# Patient Record
Sex: Male | Born: 1945 | Race: Black or African American | Hispanic: No | Marital: Married | Smoking: Former smoker
Health system: Southern US, Community
[De-identification: ages and names within clinical notes are randomized; demographics above are authoritative.]

## PROBLEM LIST (undated history)

## (undated) DIAGNOSIS — J45909 Unspecified asthma, uncomplicated: Secondary | ICD-10-CM

## (undated) DIAGNOSIS — I10 Essential (primary) hypertension: Secondary | ICD-10-CM

## (undated) DIAGNOSIS — F32A Depression, unspecified: Secondary | ICD-10-CM

## (undated) DIAGNOSIS — B1920 Unspecified viral hepatitis C without hepatic coma: Secondary | ICD-10-CM

## (undated) DIAGNOSIS — R011 Cardiac murmur, unspecified: Secondary | ICD-10-CM

## (undated) HISTORY — DX: Depression, unspecified: F32.A

## (undated) HISTORY — DX: Essential (primary) hypertension: I10

## (undated) HISTORY — PX: CARPAL TUNNEL RELEASE: SHX101

---

## 2014-11-20 ENCOUNTER — Other Ambulatory Visit (HOSPITAL_COMMUNITY): Payer: Self-pay | Admitting: Gastroenterology

## 2014-11-20 DIAGNOSIS — B182 Chronic viral hepatitis C: Secondary | ICD-10-CM

## 2014-12-01 ENCOUNTER — Other Ambulatory Visit (HOSPITAL_COMMUNITY): Payer: 59

## 2014-12-18 ENCOUNTER — Other Ambulatory Visit (HOSPITAL_COMMUNITY): Payer: Self-pay | Admitting: Gastroenterology

## 2014-12-18 ENCOUNTER — Ambulatory Visit (HOSPITAL_COMMUNITY)
Admission: RE | Admit: 2014-12-18 | Discharge: 2014-12-18 | Disposition: A | Discharge status: Home / Self Care | Payer: 59 | Source: Ambulatory Visit | Attending: Gastroenterology | Admitting: Gastroenterology

## 2014-12-18 DIAGNOSIS — B182 Chronic viral hepatitis C: Secondary | ICD-10-CM | POA: Insufficient documentation

## 2014-12-18 DIAGNOSIS — K769 Liver disease, unspecified: Secondary | ICD-10-CM | POA: Insufficient documentation

## 2014-12-18 DIAGNOSIS — K7689 Other specified diseases of liver: Secondary | ICD-10-CM | POA: Insufficient documentation

## 2015-06-18 DIAGNOSIS — B182 Chronic viral hepatitis C: Secondary | ICD-10-CM | POA: Diagnosis not present

## 2015-07-02 DIAGNOSIS — J302 Other seasonal allergic rhinitis: Secondary | ICD-10-CM | POA: Diagnosis not present

## 2015-07-02 DIAGNOSIS — Z6830 Body mass index (BMI) 30.0-30.9, adult: Secondary | ICD-10-CM | POA: Diagnosis not present

## 2015-07-02 DIAGNOSIS — K7469 Other cirrhosis of liver: Secondary | ICD-10-CM | POA: Diagnosis not present

## 2015-07-02 DIAGNOSIS — B182 Chronic viral hepatitis C: Secondary | ICD-10-CM | POA: Diagnosis not present

## 2015-07-02 DIAGNOSIS — Z1389 Encounter for screening for other disorder: Secondary | ICD-10-CM | POA: Diagnosis not present

## 2015-07-02 DIAGNOSIS — I10 Essential (primary) hypertension: Secondary | ICD-10-CM | POA: Diagnosis not present

## 2015-07-02 DIAGNOSIS — Z0000 Encounter for general adult medical examination without abnormal findings: Secondary | ICD-10-CM | POA: Diagnosis not present

## 2015-07-02 DIAGNOSIS — K7689 Other specified diseases of liver: Secondary | ICD-10-CM | POA: Diagnosis not present

## 2015-07-13 DIAGNOSIS — H6690 Otitis media, unspecified, unspecified ear: Secondary | ICD-10-CM | POA: Diagnosis not present

## 2015-07-22 DIAGNOSIS — R932 Abnormal findings on diagnostic imaging of liver and biliary tract: Secondary | ICD-10-CM | POA: Diagnosis not present

## 2015-07-22 DIAGNOSIS — K769 Liver disease, unspecified: Secondary | ICD-10-CM | POA: Diagnosis not present

## 2015-07-22 DIAGNOSIS — Q859 Phakomatosis, unspecified: Secondary | ICD-10-CM | POA: Diagnosis not present

## 2015-07-22 DIAGNOSIS — B182 Chronic viral hepatitis C: Secondary | ICD-10-CM | POA: Diagnosis not present

## 2015-07-22 DIAGNOSIS — K7689 Other specified diseases of liver: Secondary | ICD-10-CM | POA: Diagnosis not present

## 2015-07-22 DIAGNOSIS — K7469 Other cirrhosis of liver: Secondary | ICD-10-CM | POA: Diagnosis not present

## 2015-09-10 DIAGNOSIS — B182 Chronic viral hepatitis C: Secondary | ICD-10-CM | POA: Diagnosis not present

## 2015-09-17 DIAGNOSIS — K7469 Other cirrhosis of liver: Secondary | ICD-10-CM | POA: Diagnosis not present

## 2015-09-17 DIAGNOSIS — Z6830 Body mass index (BMI) 30.0-30.9, adult: Secondary | ICD-10-CM | POA: Diagnosis not present

## 2015-09-17 DIAGNOSIS — B182 Chronic viral hepatitis C: Secondary | ICD-10-CM | POA: Diagnosis not present

## 2015-09-17 DIAGNOSIS — K7689 Other specified diseases of liver: Secondary | ICD-10-CM | POA: Diagnosis not present

## 2015-10-30 DIAGNOSIS — K635 Polyp of colon: Secondary | ICD-10-CM | POA: Diagnosis not present

## 2015-10-30 DIAGNOSIS — I10 Essential (primary) hypertension: Secondary | ICD-10-CM | POA: Diagnosis not present

## 2015-10-30 DIAGNOSIS — Z6831 Body mass index (BMI) 31.0-31.9, adult: Secondary | ICD-10-CM | POA: Diagnosis not present

## 2015-10-30 DIAGNOSIS — B1920 Unspecified viral hepatitis C without hepatic coma: Secondary | ICD-10-CM | POA: Diagnosis not present

## 2015-11-18 DIAGNOSIS — N3020 Other chronic cystitis without hematuria: Secondary | ICD-10-CM | POA: Diagnosis not present

## 2015-11-18 DIAGNOSIS — N3281 Overactive bladder: Secondary | ICD-10-CM | POA: Diagnosis not present

## 2015-11-18 DIAGNOSIS — E291 Testicular hypofunction: Secondary | ICD-10-CM | POA: Diagnosis not present

## 2015-11-18 DIAGNOSIS — N401 Enlarged prostate with lower urinary tract symptoms: Secondary | ICD-10-CM | POA: Diagnosis not present

## 2016-02-29 DIAGNOSIS — I10 Essential (primary) hypertension: Secondary | ICD-10-CM | POA: Diagnosis not present

## 2016-02-29 DIAGNOSIS — F331 Major depressive disorder, recurrent, moderate: Secondary | ICD-10-CM | POA: Diagnosis not present

## 2016-05-25 DIAGNOSIS — Z79899 Other long term (current) drug therapy: Secondary | ICD-10-CM | POA: Diagnosis not present

## 2016-05-25 DIAGNOSIS — F330 Major depressive disorder, recurrent, mild: Secondary | ICD-10-CM | POA: Diagnosis not present

## 2016-05-25 DIAGNOSIS — I10 Essential (primary) hypertension: Secondary | ICD-10-CM | POA: Diagnosis not present

## 2016-05-25 DIAGNOSIS — B182 Chronic viral hepatitis C: Secondary | ICD-10-CM | POA: Diagnosis not present

## 2016-06-27 DIAGNOSIS — B182 Chronic viral hepatitis C: Secondary | ICD-10-CM | POA: Diagnosis not present

## 2016-06-27 DIAGNOSIS — Z6830 Body mass index (BMI) 30.0-30.9, adult: Secondary | ICD-10-CM | POA: Diagnosis not present

## 2016-06-27 DIAGNOSIS — Z79899 Other long term (current) drug therapy: Secondary | ICD-10-CM | POA: Diagnosis not present

## 2016-06-27 DIAGNOSIS — K7460 Unspecified cirrhosis of liver: Secondary | ICD-10-CM | POA: Diagnosis not present

## 2016-07-01 DIAGNOSIS — K7460 Unspecified cirrhosis of liver: Secondary | ICD-10-CM | POA: Diagnosis not present

## 2016-07-01 DIAGNOSIS — C220 Liver cell carcinoma: Secondary | ICD-10-CM | POA: Diagnosis not present

## 2016-07-01 DIAGNOSIS — B182 Chronic viral hepatitis C: Secondary | ICD-10-CM | POA: Diagnosis not present

## 2016-07-20 DIAGNOSIS — B182 Chronic viral hepatitis C: Secondary | ICD-10-CM | POA: Diagnosis not present

## 2016-07-20 DIAGNOSIS — Z79899 Other long term (current) drug therapy: Secondary | ICD-10-CM | POA: Diagnosis not present

## 2016-08-29 DIAGNOSIS — I10 Essential (primary) hypertension: Secondary | ICD-10-CM | POA: Diagnosis not present

## 2016-08-29 DIAGNOSIS — F331 Major depressive disorder, recurrent, moderate: Secondary | ICD-10-CM | POA: Diagnosis not present

## 2016-10-12 DIAGNOSIS — B182 Chronic viral hepatitis C: Secondary | ICD-10-CM | POA: Diagnosis not present

## 2016-10-12 DIAGNOSIS — Z79899 Other long term (current) drug therapy: Secondary | ICD-10-CM | POA: Diagnosis not present

## 2016-10-31 DIAGNOSIS — F330 Major depressive disorder, recurrent, mild: Secondary | ICD-10-CM | POA: Diagnosis not present

## 2016-11-16 DIAGNOSIS — E291 Testicular hypofunction: Secondary | ICD-10-CM | POA: Diagnosis not present

## 2016-11-16 DIAGNOSIS — N401 Enlarged prostate with lower urinary tract symptoms: Secondary | ICD-10-CM | POA: Diagnosis not present

## 2016-11-16 DIAGNOSIS — N3020 Other chronic cystitis without hematuria: Secondary | ICD-10-CM | POA: Diagnosis not present

## 2016-12-13 DIAGNOSIS — Z6831 Body mass index (BMI) 31.0-31.9, adult: Secondary | ICD-10-CM | POA: Diagnosis not present

## 2016-12-13 DIAGNOSIS — Z23 Encounter for immunization: Secondary | ICD-10-CM | POA: Diagnosis not present

## 2016-12-13 DIAGNOSIS — K7460 Unspecified cirrhosis of liver: Secondary | ICD-10-CM | POA: Diagnosis not present

## 2016-12-28 DIAGNOSIS — K7460 Unspecified cirrhosis of liver: Secondary | ICD-10-CM | POA: Diagnosis not present

## 2016-12-28 DIAGNOSIS — R932 Abnormal findings on diagnostic imaging of liver and biliary tract: Secondary | ICD-10-CM | POA: Diagnosis not present

## 2017-01-31 DIAGNOSIS — Z0000 Encounter for general adult medical examination without abnormal findings: Secondary | ICD-10-CM | POA: Diagnosis not present

## 2017-01-31 DIAGNOSIS — N400 Enlarged prostate without lower urinary tract symptoms: Secondary | ICD-10-CM | POA: Diagnosis not present

## 2017-01-31 DIAGNOSIS — I10 Essential (primary) hypertension: Secondary | ICD-10-CM | POA: Diagnosis not present

## 2017-01-31 DIAGNOSIS — M25512 Pain in left shoulder: Secondary | ICD-10-CM | POA: Diagnosis not present

## 2017-02-16 DIAGNOSIS — M19012 Primary osteoarthritis, left shoulder: Secondary | ICD-10-CM | POA: Diagnosis not present

## 2017-06-15 DIAGNOSIS — G5602 Carpal tunnel syndrome, left upper limb: Secondary | ICD-10-CM | POA: Diagnosis not present

## 2017-06-28 DIAGNOSIS — G5602 Carpal tunnel syndrome, left upper limb: Secondary | ICD-10-CM | POA: Diagnosis not present

## 2017-06-28 DIAGNOSIS — M5412 Radiculopathy, cervical region: Secondary | ICD-10-CM | POA: Diagnosis not present

## 2017-07-10 DIAGNOSIS — G5602 Carpal tunnel syndrome, left upper limb: Secondary | ICD-10-CM | POA: Diagnosis not present

## 2017-09-21 DIAGNOSIS — M19012 Primary osteoarthritis, left shoulder: Secondary | ICD-10-CM | POA: Diagnosis not present

## 2017-10-03 DIAGNOSIS — Z6834 Body mass index (BMI) 34.0-34.9, adult: Secondary | ICD-10-CM | POA: Diagnosis not present

## 2017-10-03 DIAGNOSIS — B182 Chronic viral hepatitis C: Secondary | ICD-10-CM | POA: Diagnosis not present

## 2017-10-03 DIAGNOSIS — I10 Essential (primary) hypertension: Secondary | ICD-10-CM | POA: Diagnosis not present

## 2017-10-03 DIAGNOSIS — Z79899 Other long term (current) drug therapy: Secondary | ICD-10-CM | POA: Diagnosis not present

## 2017-10-03 DIAGNOSIS — K7460 Unspecified cirrhosis of liver: Secondary | ICD-10-CM | POA: Diagnosis not present

## 2017-10-03 DIAGNOSIS — Z7982 Long term (current) use of aspirin: Secondary | ICD-10-CM | POA: Diagnosis not present

## 2017-10-13 DIAGNOSIS — I10 Essential (primary) hypertension: Secondary | ICD-10-CM | POA: Diagnosis not present

## 2017-11-14 DIAGNOSIS — N3281 Overactive bladder: Secondary | ICD-10-CM | POA: Diagnosis not present

## 2017-11-14 DIAGNOSIS — N3020 Other chronic cystitis without hematuria: Secondary | ICD-10-CM | POA: Diagnosis not present

## 2017-11-14 DIAGNOSIS — N400 Enlarged prostate without lower urinary tract symptoms: Secondary | ICD-10-CM | POA: Diagnosis not present

## 2017-11-14 DIAGNOSIS — E291 Testicular hypofunction: Secondary | ICD-10-CM | POA: Diagnosis not present

## 2017-11-14 DIAGNOSIS — N401 Enlarged prostate with lower urinary tract symptoms: Secondary | ICD-10-CM | POA: Diagnosis not present

## 2017-12-12 DIAGNOSIS — R351 Nocturia: Secondary | ICD-10-CM | POA: Diagnosis not present

## 2017-12-12 DIAGNOSIS — N401 Enlarged prostate with lower urinary tract symptoms: Secondary | ICD-10-CM | POA: Diagnosis not present

## 2017-12-12 DIAGNOSIS — F515 Nightmare disorder: Secondary | ICD-10-CM | POA: Diagnosis not present

## 2017-12-19 DIAGNOSIS — K7460 Unspecified cirrhosis of liver: Secondary | ICD-10-CM | POA: Diagnosis not present

## 2017-12-19 DIAGNOSIS — R011 Cardiac murmur, unspecified: Secondary | ICD-10-CM | POA: Diagnosis not present

## 2017-12-19 DIAGNOSIS — Z79899 Other long term (current) drug therapy: Secondary | ICD-10-CM | POA: Diagnosis not present

## 2017-12-19 DIAGNOSIS — I10 Essential (primary) hypertension: Secondary | ICD-10-CM | POA: Diagnosis not present

## 2017-12-19 DIAGNOSIS — Z7982 Long term (current) use of aspirin: Secondary | ICD-10-CM | POA: Diagnosis not present

## 2017-12-29 DIAGNOSIS — B182 Chronic viral hepatitis C: Secondary | ICD-10-CM | POA: Diagnosis not present

## 2017-12-29 DIAGNOSIS — K7460 Unspecified cirrhosis of liver: Secondary | ICD-10-CM | POA: Diagnosis not present

## 2018-01-17 DIAGNOSIS — G5602 Carpal tunnel syndrome, left upper limb: Secondary | ICD-10-CM | POA: Diagnosis not present

## 2018-01-25 DIAGNOSIS — G4700 Insomnia, unspecified: Secondary | ICD-10-CM | POA: Diagnosis not present

## 2018-01-26 DIAGNOSIS — N400 Enlarged prostate without lower urinary tract symptoms: Secondary | ICD-10-CM | POA: Diagnosis not present

## 2018-01-26 DIAGNOSIS — F329 Major depressive disorder, single episode, unspecified: Secondary | ICD-10-CM | POA: Diagnosis not present

## 2018-01-26 DIAGNOSIS — Z79899 Other long term (current) drug therapy: Secondary | ICD-10-CM | POA: Diagnosis not present

## 2018-01-26 DIAGNOSIS — Z87891 Personal history of nicotine dependence: Secondary | ICD-10-CM | POA: Diagnosis not present

## 2018-01-26 DIAGNOSIS — Z8619 Personal history of other infectious and parasitic diseases: Secondary | ICD-10-CM | POA: Diagnosis not present

## 2018-01-26 DIAGNOSIS — G5602 Carpal tunnel syndrome, left upper limb: Secondary | ICD-10-CM | POA: Diagnosis not present

## 2018-01-26 DIAGNOSIS — J45909 Unspecified asthma, uncomplicated: Secondary | ICD-10-CM | POA: Diagnosis not present

## 2018-01-26 DIAGNOSIS — Z7982 Long term (current) use of aspirin: Secondary | ICD-10-CM | POA: Diagnosis not present

## 2018-01-26 DIAGNOSIS — I10 Essential (primary) hypertension: Secondary | ICD-10-CM | POA: Diagnosis not present

## 2018-02-19 DIAGNOSIS — I10 Essential (primary) hypertension: Secondary | ICD-10-CM | POA: Diagnosis not present

## 2018-02-19 DIAGNOSIS — Z0000 Encounter for general adult medical examination without abnormal findings: Secondary | ICD-10-CM | POA: Diagnosis not present

## 2018-02-19 DIAGNOSIS — B182 Chronic viral hepatitis C: Secondary | ICD-10-CM | POA: Diagnosis not present

## 2018-02-19 DIAGNOSIS — Z1331 Encounter for screening for depression: Secondary | ICD-10-CM | POA: Diagnosis not present

## 2018-02-19 DIAGNOSIS — N400 Enlarged prostate without lower urinary tract symptoms: Secondary | ICD-10-CM | POA: Diagnosis not present

## 2018-02-19 DIAGNOSIS — Z13220 Encounter for screening for lipoid disorders: Secondary | ICD-10-CM | POA: Diagnosis not present

## 2018-02-19 DIAGNOSIS — Z23 Encounter for immunization: Secondary | ICD-10-CM | POA: Diagnosis not present

## 2018-03-12 DIAGNOSIS — M19012 Primary osteoarthritis, left shoulder: Secondary | ICD-10-CM | POA: Diagnosis not present

## 2018-03-13 DIAGNOSIS — G4733 Obstructive sleep apnea (adult) (pediatric): Secondary | ICD-10-CM | POA: Diagnosis not present

## 2018-03-13 DIAGNOSIS — J4520 Mild intermittent asthma, uncomplicated: Secondary | ICD-10-CM | POA: Diagnosis not present

## 2018-03-13 DIAGNOSIS — R5383 Other fatigue: Secondary | ICD-10-CM | POA: Diagnosis not present

## 2018-03-16 DIAGNOSIS — M25442 Effusion, left hand: Secondary | ICD-10-CM | POA: Diagnosis not present

## 2018-03-16 DIAGNOSIS — G5602 Carpal tunnel syndrome, left upper limb: Secondary | ICD-10-CM | POA: Diagnosis not present

## 2018-03-16 DIAGNOSIS — M79642 Pain in left hand: Secondary | ICD-10-CM | POA: Diagnosis not present

## 2018-03-16 DIAGNOSIS — M25642 Stiffness of left hand, not elsewhere classified: Secondary | ICD-10-CM | POA: Diagnosis not present

## 2018-03-19 DIAGNOSIS — M25442 Effusion, left hand: Secondary | ICD-10-CM | POA: Diagnosis not present

## 2018-03-19 DIAGNOSIS — M79642 Pain in left hand: Secondary | ICD-10-CM | POA: Diagnosis not present

## 2018-03-19 DIAGNOSIS — G5602 Carpal tunnel syndrome, left upper limb: Secondary | ICD-10-CM | POA: Diagnosis not present

## 2018-03-19 DIAGNOSIS — M25642 Stiffness of left hand, not elsewhere classified: Secondary | ICD-10-CM | POA: Diagnosis not present

## 2018-03-22 DIAGNOSIS — M25442 Effusion, left hand: Secondary | ICD-10-CM | POA: Diagnosis not present

## 2018-03-22 DIAGNOSIS — G5602 Carpal tunnel syndrome, left upper limb: Secondary | ICD-10-CM | POA: Diagnosis not present

## 2018-03-22 DIAGNOSIS — M79642 Pain in left hand: Secondary | ICD-10-CM | POA: Diagnosis not present

## 2018-03-22 DIAGNOSIS — M25642 Stiffness of left hand, not elsewhere classified: Secondary | ICD-10-CM | POA: Diagnosis not present

## 2018-04-30 DIAGNOSIS — M19012 Primary osteoarthritis, left shoulder: Secondary | ICD-10-CM | POA: Diagnosis not present

## 2018-04-30 DIAGNOSIS — G5602 Carpal tunnel syndrome, left upper limb: Secondary | ICD-10-CM | POA: Diagnosis not present

## 2018-06-06 DIAGNOSIS — M25442 Effusion, left hand: Secondary | ICD-10-CM | POA: Diagnosis not present

## 2018-06-06 DIAGNOSIS — M25642 Stiffness of left hand, not elsewhere classified: Secondary | ICD-10-CM | POA: Diagnosis not present

## 2018-06-06 DIAGNOSIS — M79642 Pain in left hand: Secondary | ICD-10-CM | POA: Diagnosis not present

## 2018-06-06 DIAGNOSIS — G5602 Carpal tunnel syndrome, left upper limb: Secondary | ICD-10-CM | POA: Diagnosis not present

## 2018-06-12 DIAGNOSIS — G5602 Carpal tunnel syndrome, left upper limb: Secondary | ICD-10-CM | POA: Diagnosis not present

## 2018-06-29 DIAGNOSIS — Z6836 Body mass index (BMI) 36.0-36.9, adult: Secondary | ICD-10-CM | POA: Diagnosis not present

## 2018-06-29 DIAGNOSIS — I10 Essential (primary) hypertension: Secondary | ICD-10-CM | POA: Diagnosis not present

## 2018-07-17 DIAGNOSIS — G5602 Carpal tunnel syndrome, left upper limb: Secondary | ICD-10-CM | POA: Diagnosis not present

## 2018-09-13 DIAGNOSIS — Z23 Encounter for immunization: Secondary | ICD-10-CM | POA: Diagnosis not present

## 2018-09-13 DIAGNOSIS — R0600 Dyspnea, unspecified: Secondary | ICD-10-CM | POA: Diagnosis not present

## 2018-09-13 DIAGNOSIS — I10 Essential (primary) hypertension: Secondary | ICD-10-CM | POA: Diagnosis not present

## 2018-09-13 DIAGNOSIS — R609 Edema, unspecified: Secondary | ICD-10-CM | POA: Diagnosis not present

## 2018-09-14 DIAGNOSIS — M79642 Pain in left hand: Secondary | ICD-10-CM | POA: Diagnosis not present

## 2018-09-14 DIAGNOSIS — M79641 Pain in right hand: Secondary | ICD-10-CM | POA: Diagnosis not present

## 2018-09-14 DIAGNOSIS — R29898 Other symptoms and signs involving the musculoskeletal system: Secondary | ICD-10-CM | POA: Diagnosis not present

## 2018-10-09 DIAGNOSIS — L97909 Non-pressure chronic ulcer of unspecified part of unspecified lower leg with unspecified severity: Secondary | ICD-10-CM | POA: Diagnosis not present

## 2018-10-09 DIAGNOSIS — I83009 Varicose veins of unspecified lower extremity with ulcer of unspecified site: Secondary | ICD-10-CM | POA: Diagnosis not present

## 2018-10-10 DIAGNOSIS — I87312 Chronic venous hypertension (idiopathic) with ulcer of left lower extremity: Secondary | ICD-10-CM | POA: Diagnosis not present

## 2018-10-10 DIAGNOSIS — I87311 Chronic venous hypertension (idiopathic) with ulcer of right lower extremity: Secondary | ICD-10-CM | POA: Diagnosis not present

## 2018-10-10 DIAGNOSIS — L97812 Non-pressure chronic ulcer of other part of right lower leg with fat layer exposed: Secondary | ICD-10-CM | POA: Diagnosis not present

## 2018-10-10 DIAGNOSIS — I10 Essential (primary) hypertension: Secondary | ICD-10-CM | POA: Diagnosis not present

## 2018-10-16 DIAGNOSIS — L97822 Non-pressure chronic ulcer of other part of left lower leg with fat layer exposed: Secondary | ICD-10-CM | POA: Diagnosis not present

## 2018-10-16 DIAGNOSIS — I87313 Chronic venous hypertension (idiopathic) with ulcer of bilateral lower extremity: Secondary | ICD-10-CM | POA: Diagnosis not present

## 2018-10-16 DIAGNOSIS — I87312 Chronic venous hypertension (idiopathic) with ulcer of left lower extremity: Secondary | ICD-10-CM | POA: Diagnosis not present

## 2018-10-16 DIAGNOSIS — L97812 Non-pressure chronic ulcer of other part of right lower leg with fat layer exposed: Secondary | ICD-10-CM | POA: Diagnosis not present

## 2018-10-25 DIAGNOSIS — I87312 Chronic venous hypertension (idiopathic) with ulcer of left lower extremity: Secondary | ICD-10-CM | POA: Diagnosis not present

## 2018-10-25 DIAGNOSIS — L97819 Non-pressure chronic ulcer of other part of right lower leg with unspecified severity: Secondary | ICD-10-CM | POA: Diagnosis not present

## 2018-10-25 DIAGNOSIS — I10 Essential (primary) hypertension: Secondary | ICD-10-CM | POA: Diagnosis not present

## 2018-11-08 DIAGNOSIS — I87311 Chronic venous hypertension (idiopathic) with ulcer of right lower extremity: Secondary | ICD-10-CM | POA: Diagnosis not present

## 2018-11-08 DIAGNOSIS — L97819 Non-pressure chronic ulcer of other part of right lower leg with unspecified severity: Secondary | ICD-10-CM | POA: Diagnosis not present

## 2019-06-06 DIAGNOSIS — W19XXXA Unspecified fall, initial encounter: Secondary | ICD-10-CM | POA: Diagnosis not present

## 2019-06-06 DIAGNOSIS — R29898 Other symptoms and signs involving the musculoskeletal system: Secondary | ICD-10-CM | POA: Diagnosis not present

## 2019-06-06 DIAGNOSIS — M19012 Primary osteoarthritis, left shoulder: Secondary | ICD-10-CM | POA: Diagnosis not present

## 2019-06-12 DIAGNOSIS — R531 Weakness: Secondary | ICD-10-CM | POA: Diagnosis not present

## 2019-06-12 DIAGNOSIS — S0990XA Unspecified injury of head, initial encounter: Secondary | ICD-10-CM | POA: Diagnosis not present

## 2019-06-12 DIAGNOSIS — R29898 Other symptoms and signs involving the musculoskeletal system: Secondary | ICD-10-CM | POA: Diagnosis not present

## 2019-06-17 DIAGNOSIS — M6281 Muscle weakness (generalized): Secondary | ICD-10-CM | POA: Diagnosis not present

## 2019-06-17 DIAGNOSIS — R2681 Unsteadiness on feet: Secondary | ICD-10-CM | POA: Diagnosis not present

## 2019-06-17 DIAGNOSIS — R2689 Other abnormalities of gait and mobility: Secondary | ICD-10-CM | POA: Diagnosis not present

## 2019-06-20 DIAGNOSIS — R2689 Other abnormalities of gait and mobility: Secondary | ICD-10-CM | POA: Diagnosis not present

## 2019-06-20 DIAGNOSIS — R2681 Unsteadiness on feet: Secondary | ICD-10-CM | POA: Diagnosis not present

## 2019-06-20 DIAGNOSIS — M6281 Muscle weakness (generalized): Secondary | ICD-10-CM | POA: Diagnosis not present

## 2019-06-24 DIAGNOSIS — R2681 Unsteadiness on feet: Secondary | ICD-10-CM | POA: Diagnosis not present

## 2019-06-24 DIAGNOSIS — M6281 Muscle weakness (generalized): Secondary | ICD-10-CM | POA: Diagnosis not present

## 2019-06-24 DIAGNOSIS — R2689 Other abnormalities of gait and mobility: Secondary | ICD-10-CM | POA: Diagnosis not present

## 2019-06-26 DIAGNOSIS — I10 Essential (primary) hypertension: Secondary | ICD-10-CM | POA: Diagnosis not present

## 2019-06-26 DIAGNOSIS — Z0000 Encounter for general adult medical examination without abnormal findings: Secondary | ICD-10-CM | POA: Diagnosis not present

## 2019-06-26 DIAGNOSIS — I872 Venous insufficiency (chronic) (peripheral): Secondary | ICD-10-CM | POA: Diagnosis not present

## 2019-06-26 DIAGNOSIS — Z1389 Encounter for screening for other disorder: Secondary | ICD-10-CM | POA: Diagnosis not present

## 2019-06-27 DIAGNOSIS — M6281 Muscle weakness (generalized): Secondary | ICD-10-CM | POA: Diagnosis not present

## 2019-06-27 DIAGNOSIS — R2681 Unsteadiness on feet: Secondary | ICD-10-CM | POA: Diagnosis not present

## 2019-06-27 DIAGNOSIS — R2689 Other abnormalities of gait and mobility: Secondary | ICD-10-CM | POA: Diagnosis not present

## 2019-07-01 DIAGNOSIS — R2689 Other abnormalities of gait and mobility: Secondary | ICD-10-CM | POA: Diagnosis not present

## 2019-07-01 DIAGNOSIS — M6281 Muscle weakness (generalized): Secondary | ICD-10-CM | POA: Diagnosis not present

## 2019-07-01 DIAGNOSIS — R2681 Unsteadiness on feet: Secondary | ICD-10-CM | POA: Diagnosis not present

## 2019-07-11 ENCOUNTER — Encounter: Payer: Self-pay | Admitting: Neurology

## 2019-09-06 ENCOUNTER — Encounter: Payer: Self-pay | Admitting: Neurology

## 2019-09-16 ENCOUNTER — Ambulatory Visit (INDEPENDENT_AMBULATORY_CARE_PROVIDER_SITE_OTHER): Payer: Medicare Other | Admitting: Neurology

## 2019-09-16 ENCOUNTER — Other Ambulatory Visit: Payer: Self-pay

## 2019-09-16 ENCOUNTER — Encounter: Payer: Self-pay | Admitting: Neurology

## 2019-09-16 VITALS — BP 106/75 | HR 73 | Ht 69.0 in | Wt 245.0 lb

## 2019-09-16 DIAGNOSIS — R531 Weakness: Secondary | ICD-10-CM

## 2019-09-16 DIAGNOSIS — R2681 Unsteadiness on feet: Secondary | ICD-10-CM

## 2019-09-16 MED ORDER — CARBIDOPA-LEVODOPA 25-100 MG PO TABS: ORAL_TABLET | ORAL | 0 refills | Status: AC

## 2019-09-16 NOTE — Patient Instructions (Addendum)
1.  1. Start carbidopa (Sinemet) 25/100mg  tablets.  Week 1: Take 0.5 tablet in morning and 0.5 tablet in evening/bedtime.  Week 2: Take 0.5 tablet in morning, 0.5 tablet in afternoon, and 0.5 tablet in evening/bedtime.  Week 3 & thereafter: Take 1 tablet three times daily.   Take the medication at the same time everyday. The medication does not get absorbed into your body as well, if you take it with protein-containing foods (meat, dairy, beans). Try taking the medication about one hour before meals. If you experience nausea by taking it on an empty stomach, you may take it with carbohydrate-containing food,such as bread.  Side effects to look out for, include dizziness, nausea, vivid dreams and hallucinations. If you experience any of these symptoms, please call us.  2.  Will check MRI of cervical spine without contrast.  3.  Follow up 4 to 6 months.

## 2019-09-16 NOTE — Progress Notes (Signed)
NEUROLOGY CONSULTATION NOTE  Nicholas Ramirez MRN: [id number] DOB: [birth year]  Referring provider: Townsend Roger, MD Primary care provider: Townsend Roger, MD  Reason for consult:  Left sided numbness and weakness.  HISTORY OF PRESENT ILLNESS: Nicholas Ramirez is a 74 year old right-handed male who presents for left sided numbness and weakness.  History supplemented by physical medicine and referring provider's note.  He is accompanied by his wife who also provides collateral history.  In January 2020, he underwent left sided carpal tunnel release.  Since then, he endorses left upper extremity pain, muscle spasms and weakness.  No symptoms involving his right side.  From there, he had progressive decline.  He reports no use of his left hand and cannot raise his arm from the shoulder.  He had shoulder repair as a boy but never had difficulty using the arm until this past year.  He also started having multiple falls.  He would sometimes fall out of bed after a vivid dream where he is fighting somebody.  Sometimes his legs just feel weak.  He reports that sometimes his left foot drags.  Other times, he feels lightheaded but no loss of consciousness.  He has reported tremor in his left hand, which were present before the carpal tunnel surgery.  He had a NCV-EMG in September 2020 which was unremarkable and demonstrated no evidence of median neuropathy, ulnar neuropathy, cervical radiculopathy or polyneuropathy.  He has chronic left shoulder pain with history of shoulder reconstruction.  He did reportedly have an MRI of the brain and MRA of head and neck to evaluate for possible stroke.  Imaging was negative for stroke or significant intracranial and extracranial large vessel stenosis or occlusion.    PAST MEDICAL HISTORY: Past Medical History:  Diagnosis Date  . Depression   . Hypertension    PAST SURGICAL HISTORY: Left shoulder surgery Left carpal tunnel release  MEDICATIONS: Outpatient  Encounter Medications as of 09/16/2019  Medication Sig  . aspirin 81 MG EC tablet Take 81 mg by mouth daily.  . cetirizine (ZYRTEC) 10 MG tablet Take 10 mg by mouth daily.  . DULoxetine (CYMBALTA) 60 MG capsule Take 60 mg by mouth daily.  . hydrochlorothiazide (HYDRODIURIL) 25 MG tablet Take 25 mg by mouth daily.  Marland Kitchen lisinopril (ZESTRIL) 40 MG tablet Take 40 mg by mouth daily.  Marland Kitchen MYRBETRIQ 50 MG TB24 tablet Take 50 mg by mouth daily.  . propranolol (INDERAL) 40 MG tablet Take 40 mg by mouth 2 (two) times daily.   No facility-administered encounter medications on file as of 09/16/2019.   ALLERGIES: No Known Allergies  FAMILY HISTORY: No family history on file.  SOCIAL HISTORY: Social History   Socioeconomic History  . Marital status: Married    Spouse name: Not on file  . Number of children: Not on file  . Years of education: Not on file  . Highest education level: Not on file  Occupational History  . Not on file  Tobacco Use  . Smoking status: Not on file  Substance and Sexual Activity  . Alcohol use: Not on file  . Drug use: Not on file  . Sexual activity: Not on file  Other Topics Concern  . Not on file  Social History Narrative  . Not on file   Social Determinants of Health   Financial Resource Strain:   . Difficulty of Paying Living Expenses: Not on file  Food Insecurity:   . Worried About Charity fundraiser in  the Last Year: Not on file  . Ran Out of Food in the Last Year: Not on file  Transportation Needs:   . Lack of Transportation (Medical): Not on file  . Lack of Transportation (Non-Medical): Not on file  Physical Activity:   . Days of Exercise per Week: Not on file  . Minutes of Exercise per Session: Not on file  Stress:   . Feeling of Stress : Not on file  Social Connections:   . Frequency of Communication with Friends and Family: Not on file  . Frequency of Social Gatherings with Friends and Family: Not on file  . Attends Religious Services: Not on  file  . Active Member of Clubs or Organizations: Not on file  . Attends Archivist Meetings: Not on file  . Marital Status: Not on file  Intimate Partner Violence:   . Fear of Current or Ex-Partner: Not on file  . Emotionally Abused: Not on file  . Physically Abused: Not on file  . Sexually Abused: Not on file    PHYSICAL EXAM: Blood pressure 106/75, pulse 73, height 5\' 9"  (1.753 m), weight 245 lb (111.1 kg), SpO2 94 %. General: No acute distress.   Head:  Normocephalic/atraumatic Eyes:  fundi examined but not visualized Neck: supple, no paraspinal tenderness, full range of motion Back: No paraspinal tenderness Heart: regular rate and rhythm Lungs: Clear to auscultation bilaterally. Vascular: No carotid bruits. Neurological Exam: Mental status: alert and oriented to person, place, and time, recent and remote memory intact, fund of knowledge intact, attention and concentration intact, speech fluent and not dysarthric, language intact. Cranial nerves: CN I: not tested CN II: pupils equal, round and reactive to light, visual fields intact CN III, IV, VI:  full range of motion, no nystagmus, no ptosis CN V: facial sensation intact CN VII: upper and lower face symmetric CN VIII: hearing intact CN IX, X: gag intact, uvula midline CN XI: sternocleidomastoid and trapezius muscles intact CN XII: tongue midline Bulk & Tone: Increased tone in left upper extremity.  No cogwheel rigidity. Motor:  4+/5 left upper extremity, unable to full abduct arm.  5/5 right upper extremity.  5-/5 lower extremities bilaterally.  No tremor.  Bradykinesia Sensation:  Pinprick sensation reduced in left upper extremity and bilateral lower extremities, vibratory sensation intact. Deep Tendon Reflexes:  3+ upper extremities, 2+ lower extremities, downgoing. Finger to nose testing:  Without dysmetria.  Gait:  In wheelchair.  Requires assistance to stand.  Wide-based unsteady gait. Difficulty turning.   Romberg positive.  IMPRESSION: 1.  Left sided weakness and numbness.  2.  Frequent falls  Patient looks bradykinetic and reports occasional tremor in his left hand (although not appreciated on exam today).  He describes REM sleep behavior disorder as well.  This may suggest Parkinson's disease.  However, given some hyperreflexia, would evaluate for a cervical spinal stenosis causing myelopathy as well.  PLAN: 1.  MRI of cervical spine without contrast 2.  In meantime, will try a carbidopa-levodopa trial, titrating to 25/100mg  three times daily 3.  Follow up in 4 to 6 months.  Thank you for allowing me to take part in the care of this patient.  Metta Clines, DO  CC: Townsend Roger, MD

## 2019-09-18 ENCOUNTER — Encounter (HOSPITAL_COMMUNITY)
Admission: EM | Disposition: A | Discharge status: Rehabilitation Facility | Payer: Self-pay | Source: Home / Self Care | Attending: Family Medicine

## 2019-09-18 ENCOUNTER — Inpatient Hospital Stay (HOSPITAL_COMMUNITY): Payer: Medicare Other | Admitting: Anesthesiology

## 2019-09-18 ENCOUNTER — Encounter (HOSPITAL_COMMUNITY): Payer: Self-pay

## 2019-09-18 ENCOUNTER — Other Ambulatory Visit: Payer: Self-pay

## 2019-09-18 ENCOUNTER — Inpatient Hospital Stay (HOSPITAL_COMMUNITY)
Admission: EM | Admit: 2019-09-18 | Discharge: 2019-09-30 | DRG: 116 | Disposition: A | Discharge status: Rehabilitation Facility | Payer: Medicare Other | Attending: Internal Medicine | Admitting: Internal Medicine

## 2019-09-18 ENCOUNTER — Emergency Department (HOSPITAL_COMMUNITY): Payer: Medicare Other

## 2019-09-18 DIAGNOSIS — N170 Acute kidney failure with tubular necrosis: Secondary | ICD-10-CM | POA: Diagnosis not present

## 2019-09-18 DIAGNOSIS — N320 Bladder-neck obstruction: Secondary | ICD-10-CM | POA: Diagnosis not present

## 2019-09-18 DIAGNOSIS — G8929 Other chronic pain: Secondary | ICD-10-CM | POA: Diagnosis not present

## 2019-09-18 DIAGNOSIS — R1310 Dysphagia, unspecified: Secondary | ICD-10-CM | POA: Diagnosis not present

## 2019-09-18 DIAGNOSIS — S0532XD Ocular laceration without prolapse or loss of intraocular tissue, left eye, subsequent encounter: Secondary | ICD-10-CM | POA: Diagnosis not present

## 2019-09-18 DIAGNOSIS — Z6838 Body mass index (BMI) 38.0-38.9, adult: Secondary | ICD-10-CM

## 2019-09-18 DIAGNOSIS — Z20822 Contact with and (suspected) exposure to covid-19: Secondary | ICD-10-CM | POA: Diagnosis present

## 2019-09-18 DIAGNOSIS — E8809 Other disorders of plasma-protein metabolism, not elsewhere classified: Secondary | ICD-10-CM | POA: Diagnosis not present

## 2019-09-18 DIAGNOSIS — Y92019 Unspecified place in single-family (private) house as the place of occurrence of the external cause: Secondary | ICD-10-CM | POA: Diagnosis not present

## 2019-09-18 DIAGNOSIS — C801 Malignant (primary) neoplasm, unspecified: Secondary | ICD-10-CM | POA: Diagnosis not present

## 2019-09-18 DIAGNOSIS — R220 Localized swelling, mass and lump, head: Secondary | ICD-10-CM | POA: Diagnosis not present

## 2019-09-18 DIAGNOSIS — R1010 Upper abdominal pain, unspecified: Secondary | ICD-10-CM | POA: Diagnosis not present

## 2019-09-18 DIAGNOSIS — N179 Acute kidney failure, unspecified: Secondary | ICD-10-CM

## 2019-09-18 DIAGNOSIS — S0993XA Unspecified injury of face, initial encounter: Secondary | ICD-10-CM | POA: Diagnosis not present

## 2019-09-18 DIAGNOSIS — Z79899 Other long term (current) drug therapy: Secondary | ICD-10-CM | POA: Diagnosis not present

## 2019-09-18 DIAGNOSIS — R748 Abnormal levels of other serum enzymes: Secondary | ICD-10-CM

## 2019-09-18 DIAGNOSIS — E871 Hypo-osmolality and hyponatremia: Secondary | ICD-10-CM

## 2019-09-18 DIAGNOSIS — H2511 Age-related nuclear cataract, right eye: Secondary | ICD-10-CM | POA: Diagnosis present

## 2019-09-18 DIAGNOSIS — C221 Intrahepatic bile duct carcinoma: Secondary | ICD-10-CM | POA: Diagnosis not present

## 2019-09-18 DIAGNOSIS — K831 Obstruction of bile duct: Secondary | ICD-10-CM | POA: Diagnosis not present

## 2019-09-18 DIAGNOSIS — S0590XA Unspecified injury of unspecified eye and orbit, initial encounter: Secondary | ICD-10-CM | POA: Diagnosis not present

## 2019-09-18 DIAGNOSIS — N401 Enlarged prostate with lower urinary tract symptoms: Secondary | ICD-10-CM | POA: Diagnosis present

## 2019-09-18 DIAGNOSIS — J45909 Unspecified asthma, uncomplicated: Secondary | ICD-10-CM | POA: Diagnosis not present

## 2019-09-18 DIAGNOSIS — W19XXXA Unspecified fall, initial encounter: Secondary | ICD-10-CM

## 2019-09-18 DIAGNOSIS — Z87891 Personal history of nicotine dependence: Secondary | ICD-10-CM

## 2019-09-18 DIAGNOSIS — E669 Obesity, unspecified: Secondary | ICD-10-CM | POA: Diagnosis present

## 2019-09-18 DIAGNOSIS — E875 Hyperkalemia: Secondary | ICD-10-CM | POA: Diagnosis not present

## 2019-09-18 DIAGNOSIS — S0522XA Ocular laceration and rupture with prolapse or loss of intraocular tissue, left eye, initial encounter: Secondary | ICD-10-CM | POA: Diagnosis not present

## 2019-09-18 DIAGNOSIS — Z743 Need for continuous supervision: Secondary | ICD-10-CM | POA: Diagnosis not present

## 2019-09-18 DIAGNOSIS — Z23 Encounter for immunization: Secondary | ICD-10-CM

## 2019-09-18 DIAGNOSIS — R9431 Abnormal electrocardiogram [ECG] [EKG]: Secondary | ICD-10-CM

## 2019-09-18 DIAGNOSIS — F329 Major depressive disorder, single episode, unspecified: Secondary | ICD-10-CM | POA: Diagnosis present

## 2019-09-18 DIAGNOSIS — E876 Hypokalemia: Secondary | ICD-10-CM | POA: Diagnosis not present

## 2019-09-18 DIAGNOSIS — R338 Other retention of urine: Secondary | ICD-10-CM | POA: Diagnosis not present

## 2019-09-18 DIAGNOSIS — M4312 Spondylolisthesis, cervical region: Secondary | ICD-10-CM | POA: Diagnosis not present

## 2019-09-18 DIAGNOSIS — I959 Hypotension, unspecified: Secondary | ICD-10-CM | POA: Diagnosis not present

## 2019-09-18 DIAGNOSIS — M4802 Spinal stenosis, cervical region: Secondary | ICD-10-CM | POA: Diagnosis not present

## 2019-09-18 DIAGNOSIS — K838 Other specified diseases of biliary tract: Secondary | ICD-10-CM | POA: Diagnosis not present

## 2019-09-18 DIAGNOSIS — G20 Parkinson's disease: Secondary | ICD-10-CM | POA: Diagnosis not present

## 2019-09-18 DIAGNOSIS — K5900 Constipation, unspecified: Secondary | ICD-10-CM | POA: Diagnosis not present

## 2019-09-18 DIAGNOSIS — R0902 Hypoxemia: Secondary | ICD-10-CM | POA: Diagnosis not present

## 2019-09-18 DIAGNOSIS — E222 Syndrome of inappropriate secretion of antidiuretic hormone: Secondary | ICD-10-CM | POA: Diagnosis present

## 2019-09-18 DIAGNOSIS — H2102 Hyphema, left eye: Secondary | ICD-10-CM | POA: Diagnosis not present

## 2019-09-18 DIAGNOSIS — D72829 Elevated white blood cell count, unspecified: Secondary | ICD-10-CM | POA: Diagnosis not present

## 2019-09-18 DIAGNOSIS — D696 Thrombocytopenia, unspecified: Secondary | ICD-10-CM | POA: Diagnosis not present

## 2019-09-18 DIAGNOSIS — S0990XA Unspecified injury of head, initial encounter: Secondary | ICD-10-CM | POA: Diagnosis not present

## 2019-09-18 DIAGNOSIS — Y92009 Unspecified place in unspecified non-institutional (private) residence as the place of occurrence of the external cause: Secondary | ICD-10-CM | POA: Diagnosis not present

## 2019-09-18 DIAGNOSIS — E861 Hypovolemia: Secondary | ICD-10-CM | POA: Diagnosis not present

## 2019-09-18 DIAGNOSIS — Z888 Allergy status to other drugs, medicaments and biological substances status: Secondary | ICD-10-CM

## 2019-09-18 DIAGNOSIS — K7689 Other specified diseases of liver: Secondary | ICD-10-CM | POA: Diagnosis not present

## 2019-09-18 DIAGNOSIS — S0532XA Ocular laceration without prolapse or loss of intraocular tissue, left eye, initial encounter: Secondary | ICD-10-CM

## 2019-09-18 DIAGNOSIS — R296 Repeated falls: Secondary | ICD-10-CM | POA: Diagnosis not present

## 2019-09-18 DIAGNOSIS — W01190A Fall on same level from slipping, tripping and stumbling with subsequent striking against furniture, initial encounter: Secondary | ICD-10-CM | POA: Diagnosis present

## 2019-09-18 DIAGNOSIS — R5381 Other malaise: Secondary | ICD-10-CM | POA: Diagnosis not present

## 2019-09-18 DIAGNOSIS — R140 Abdominal distension (gaseous): Secondary | ICD-10-CM | POA: Diagnosis not present

## 2019-09-18 DIAGNOSIS — R188 Other ascites: Secondary | ICD-10-CM | POA: Diagnosis not present

## 2019-09-18 DIAGNOSIS — Z6841 Body Mass Index (BMI) 40.0 and over, adult: Secondary | ICD-10-CM | POA: Diagnosis not present

## 2019-09-18 DIAGNOSIS — E46 Unspecified protein-calorie malnutrition: Secondary | ICD-10-CM | POA: Diagnosis not present

## 2019-09-18 DIAGNOSIS — B1920 Unspecified viral hepatitis C without hepatic coma: Secondary | ICD-10-CM | POA: Diagnosis present

## 2019-09-18 DIAGNOSIS — K7460 Unspecified cirrhosis of liver: Secondary | ICD-10-CM | POA: Diagnosis not present

## 2019-09-18 DIAGNOSIS — I10 Essential (primary) hypertension: Secondary | ICD-10-CM

## 2019-09-18 DIAGNOSIS — Z7982 Long term (current) use of aspirin: Secondary | ICD-10-CM | POA: Diagnosis not present

## 2019-09-18 DIAGNOSIS — D649 Anemia, unspecified: Secondary | ICD-10-CM | POA: Diagnosis not present

## 2019-09-18 DIAGNOSIS — E870 Hyperosmolality and hypernatremia: Secondary | ICD-10-CM | POA: Diagnosis not present

## 2019-09-18 DIAGNOSIS — M47812 Spondylosis without myelopathy or radiculopathy, cervical region: Secondary | ICD-10-CM | POA: Diagnosis not present

## 2019-09-18 DIAGNOSIS — K828 Other specified diseases of gallbladder: Secondary | ICD-10-CM | POA: Diagnosis not present

## 2019-09-18 DIAGNOSIS — R7401 Elevation of levels of liver transaminase levels: Secondary | ICD-10-CM | POA: Diagnosis not present

## 2019-09-18 DIAGNOSIS — E873 Alkalosis: Secondary | ICD-10-CM | POA: Diagnosis not present

## 2019-09-18 DIAGNOSIS — C240 Malignant neoplasm of extrahepatic bile duct: Secondary | ICD-10-CM | POA: Diagnosis not present

## 2019-09-18 HISTORY — DX: Unspecified viral hepatitis C without hepatic coma: B19.20

## 2019-09-18 HISTORY — DX: Unspecified asthma, uncomplicated: J45.909

## 2019-09-18 HISTORY — DX: Cardiac murmur, unspecified: R01.1

## 2019-09-18 HISTORY — PX: RUPTURED GLOBE EXPLORATION AND REPAIR: SHX2366

## 2019-09-18 LAB — CBC WITH DIFFERENTIAL/PLATELET
Abs Immature Granulocytes: 0.08 10*3/uL — ABNORMAL HIGH (ref 0.00–0.07)
Basophils Absolute: 0.1 10*3/uL (ref 0.0–0.1)
Basophils Relative: 1 %
Eosinophils Absolute: 0.2 10*3/uL (ref 0.0–0.5)
Eosinophils Relative: 4 %
HCT: 34.8 % — ABNORMAL LOW (ref 39.0–52.0)
Hemoglobin: 12.6 g/dL — ABNORMAL LOW (ref 13.0–17.0)
Immature Granulocytes: 1 %
Lymphocytes Relative: 27 %
Lymphs Abs: 1.8 10*3/uL (ref 0.7–4.0)
MCH: 35 pg — ABNORMAL HIGH (ref 26.0–34.0)
MCHC: 36.2 g/dL — ABNORMAL HIGH (ref 30.0–36.0)
MCV: 96.7 fL (ref 80.0–100.0)
Monocytes Absolute: 0.9 10*3/uL (ref 0.1–1.0)
Monocytes Relative: 13 %
Neutro Abs: 3.7 10*3/uL (ref 1.7–7.7)
Neutrophils Relative %: 54 %
Platelets: 103 10*3/uL — ABNORMAL LOW (ref 150–400)
RBC: 3.6 MIL/uL — ABNORMAL LOW (ref 4.22–5.81)
RDW: 20 % — ABNORMAL HIGH (ref 11.5–15.5)
WBC: 6.8 10*3/uL (ref 4.0–10.5)
nRBC: 0 % (ref 0.0–0.2)

## 2019-09-18 LAB — PROTIME-INR
INR: 1.3 — ABNORMAL HIGH (ref 0.8–1.2)
Prothrombin Time: 15.4 seconds — ABNORMAL HIGH (ref 11.4–15.2)

## 2019-09-18 LAB — SARS CORONAVIRUS 2 BY RT PCR (HOSPITAL ORDER, PERFORMED IN ~~LOC~~ HOSPITAL LAB): SARS Coronavirus 2: NEGATIVE

## 2019-09-18 LAB — BASIC METABOLIC PANEL
Anion gap: 10 (ref 5–15)
BUN: 6 mg/dL — ABNORMAL LOW (ref 8–23)
CO2: 22 mmol/L (ref 22–32)
Calcium: 7.9 mg/dL — ABNORMAL LOW (ref 8.9–10.3)
Chloride: 91 mmol/L — ABNORMAL LOW (ref 98–111)
Creatinine, Ser: 0.8 mg/dL (ref 0.61–1.24)
GFR calc Af Amer: >60 mL/min (ref >60)
GFR calc non Af Amer: >60 mL/min (ref >60)
Glucose, Bld: 101 mg/dL — ABNORMAL HIGH (ref 70–99)
Potassium: 3.5 mmol/L (ref 3.5–5.1)
Sodium: 123 mmol/L — ABNORMAL LOW (ref 135–145)

## 2019-09-18 LAB — TROPONIN I (HIGH SENSITIVITY): Troponin I (High Sensitivity): 12 ng/L (ref <18)

## 2019-09-18 IMAGING — CT CT HEAD W/O CM
4 series · 16 of 47 positions shown, 18 images · non-contrast
Comparison: None.

CLINICAL DATA: Fall with left orbital trauma

EXAM:
CT HEAD AND ORBITS WITHOUT CONTRAST
TECHNIQUE: Contiguous axial images were obtained from the base of the skull
through the vertex without contrast. Multidetector CT imaging of the
orbits was performed using the standard protocol without intravenous
contrast.

[Series 1: head without · axial · non-contrast · 0.46mm/px · z∈[-65,+55]mm · 7 of 32 slices shown, 9 images]
[im 4/32  brain]
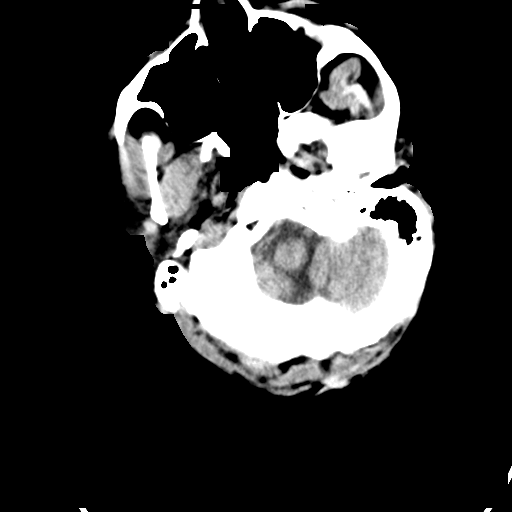
[im 4/32  bone]
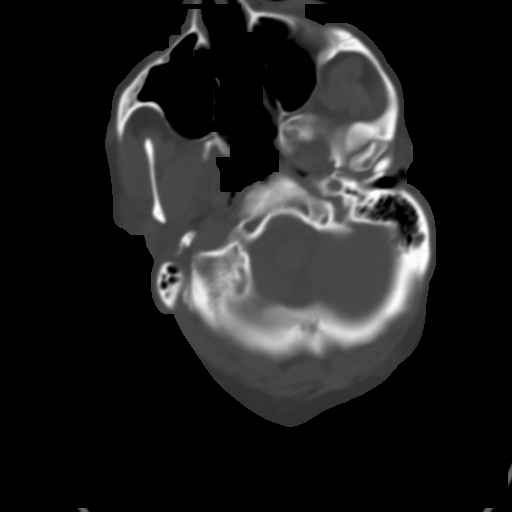
[im 8/32  brain]
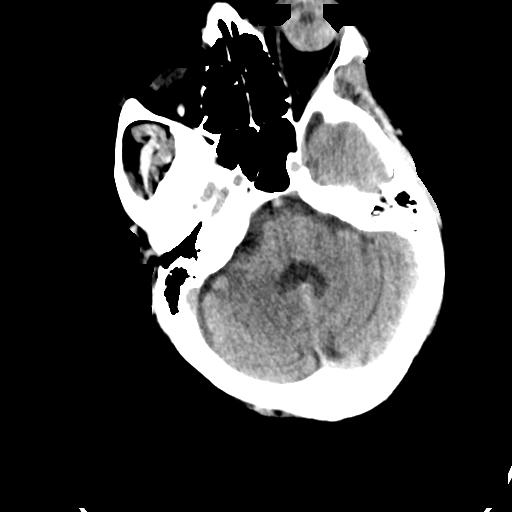
[im 12/32  brain]
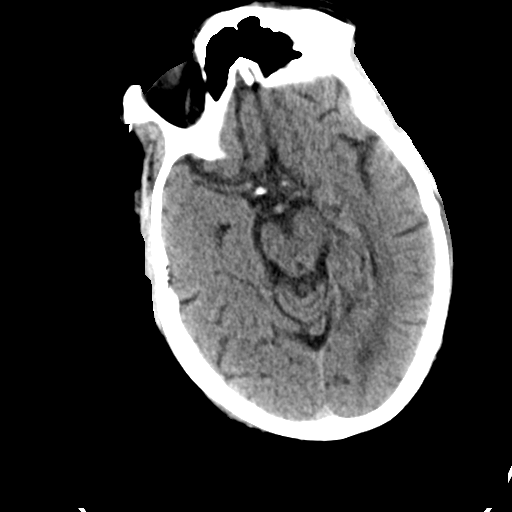
[im 16/32  brain]
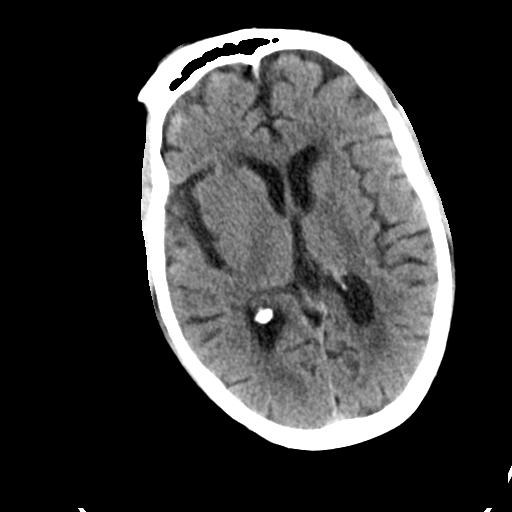
[im 20/32  brain]
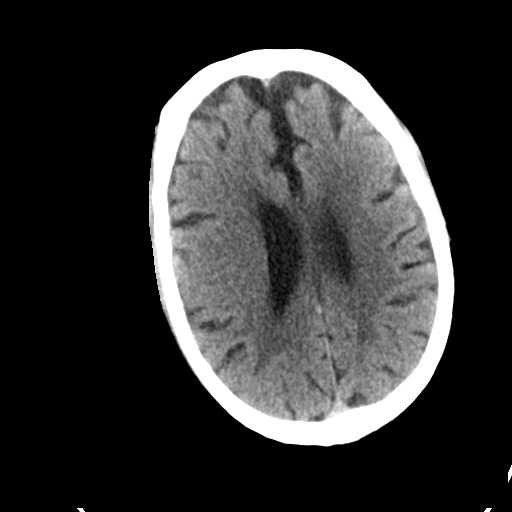
[im 20/32  bone]
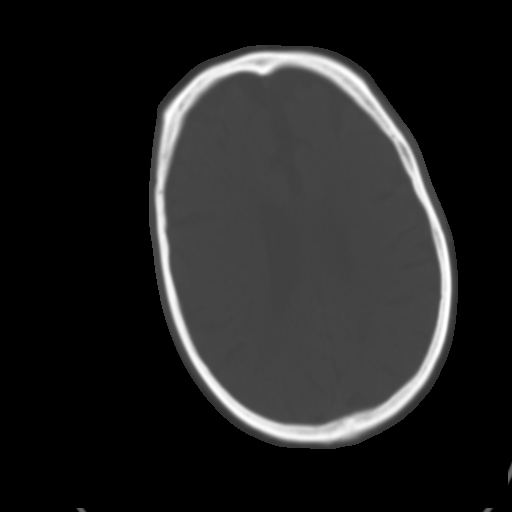
[im 24/32  brain]
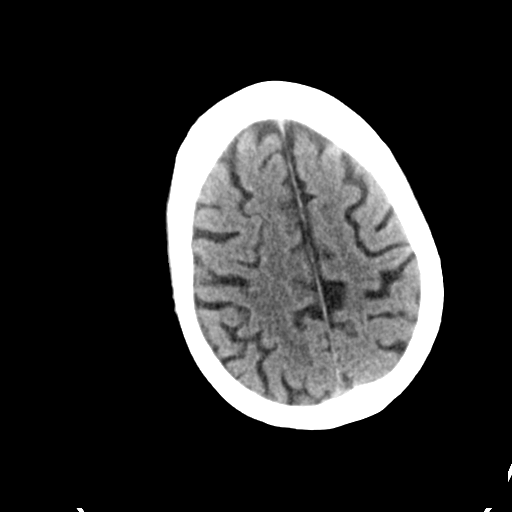
[im 28/32  brain]
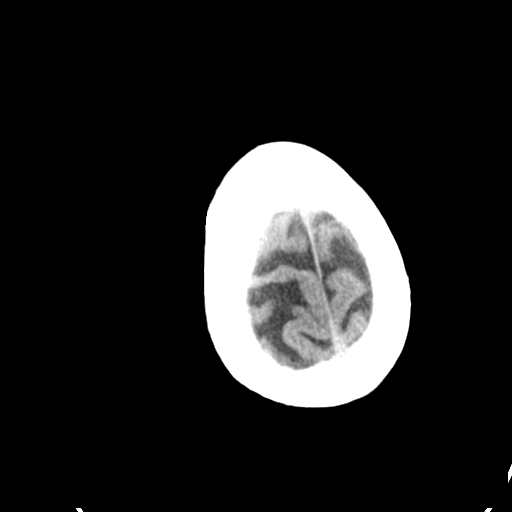

[Series 4: head bone · axial · 0.46mm/px · z∈[-66,-34]mm · 3 of 80 slices shown]
[im 8/80  bone]
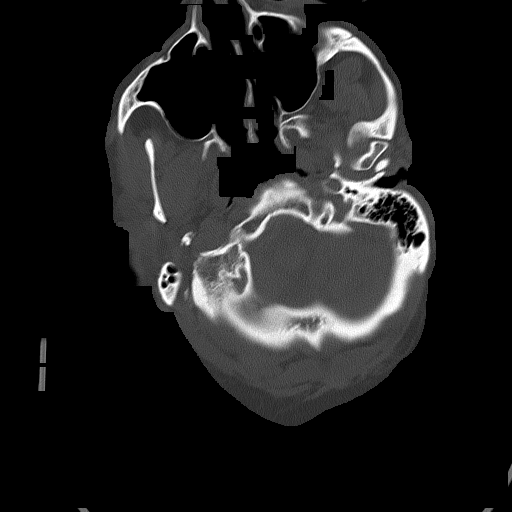
[im 16/80  bone]
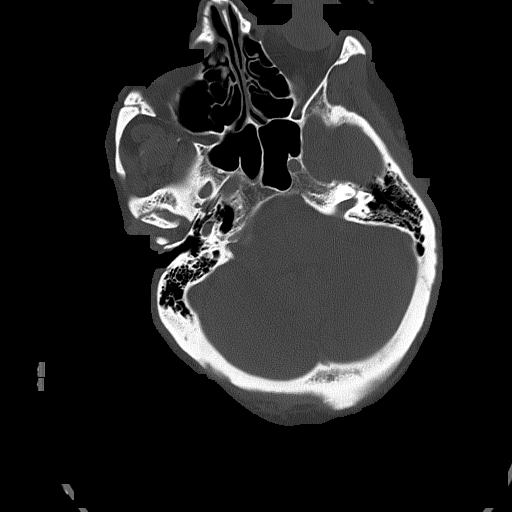
[im 24/80  bone]
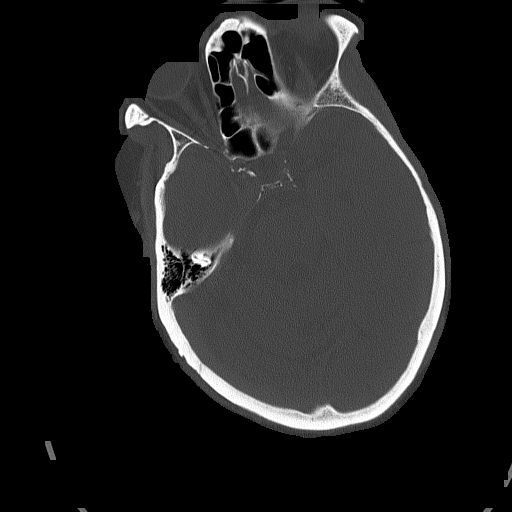

[Series 5: head without cor · coronal · non-contrast · 0.34mm/px · 3 of 78 slices shown]
[im 26/78  brain]
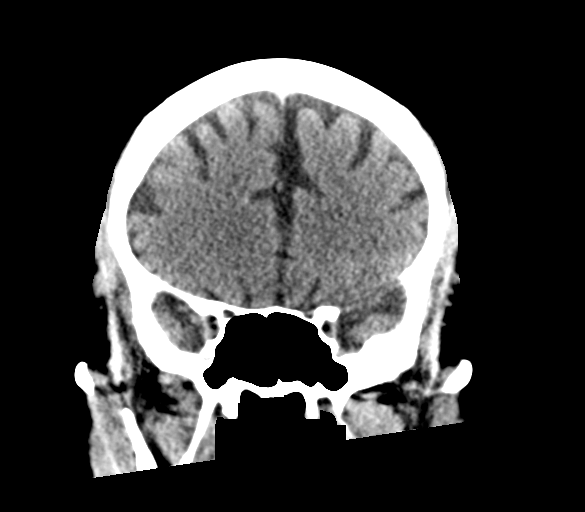
[im 35/78  brain]
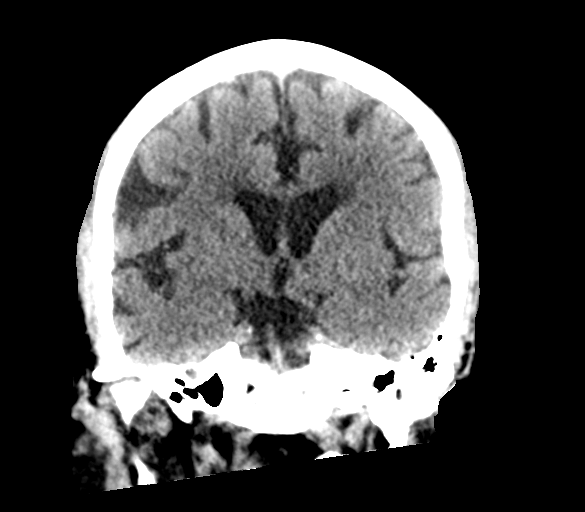
[im 43/78  brain]
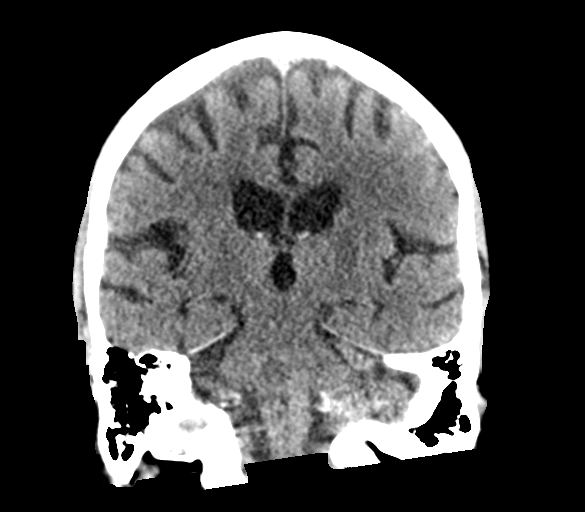

[Series 6: head without sag · sagittal · non-contrast · 0.35mm/px · 3 of 66 slices shown]
[im 26/66  brain]
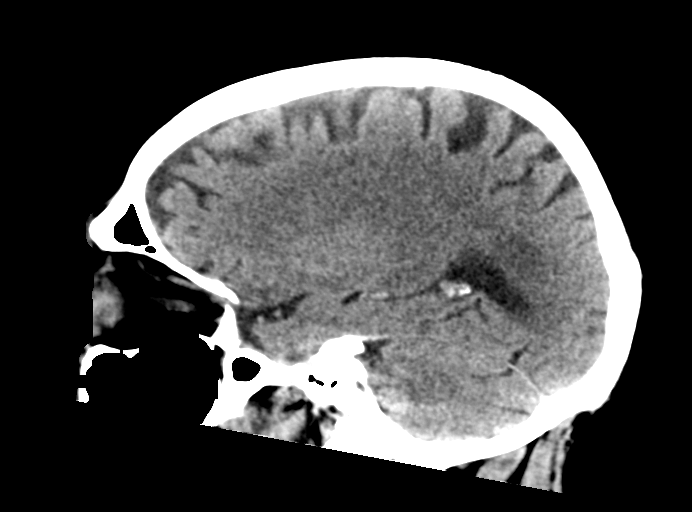
[im 33/66  brain]
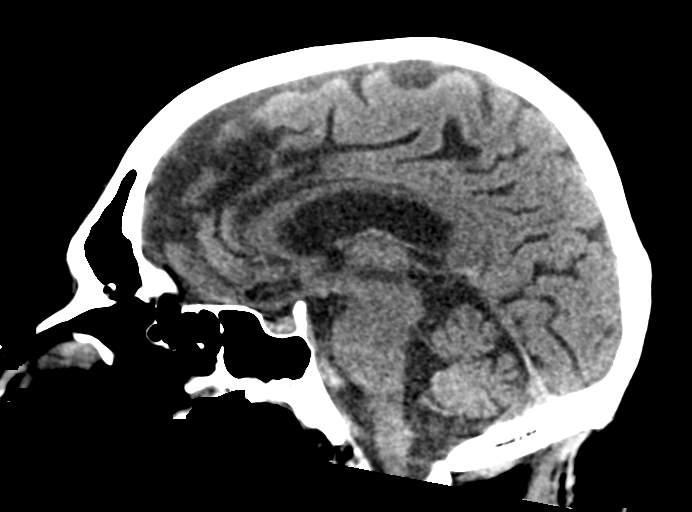
[im 39/66  brain]
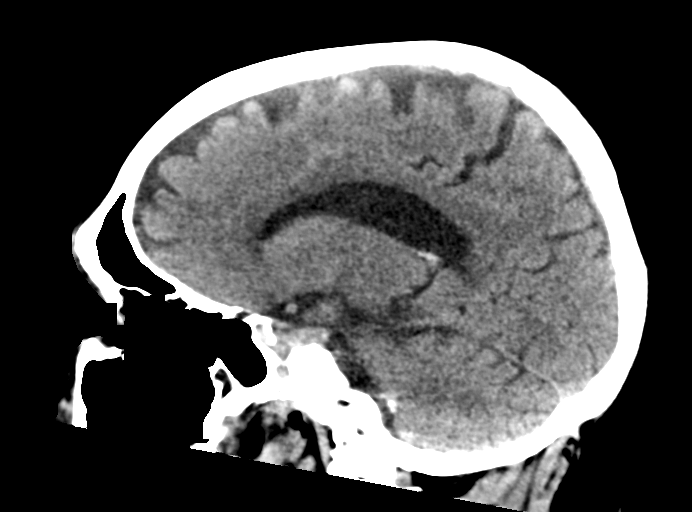

[16 of 47 positions shown; findings below may reference images not displayed]

FINDINGS: CT HEAD FINDINGS

Brain: No evidence of acute infarction, hemorrhage, hydrocephalus,
extra-axial collection or mass lesion/mass effect.

Vascular: No hyperdense vessel or unexpected calcification.

Skull: Normal. Negative for fracture or focal lesion.

Other: None.

CT ORBITS FINDINGS

Orbits: There is heterogeneous density material throughout all
chambers of the left lobe. There is suspected globe disruption along
the anterior aspect best seen series 10, image 63. The left optic
nerve and extraocular muscles are normal. The right orbit is normal.

Visualized sinuses: Clear.

Soft tissues: Mild left periorbital soft tissue swelling.
IMPRESSION: 1. No acute intracranial abnormality.
2. Suspected globe disruption along the anterior aspect of the left
globe with intra-ocular hemorrhage.
3. Mild left periorbital soft tissue swelling.

## 2019-09-18 IMAGING — CT CT ORBITS W/O CM
3 series · 15 of 47 positions shown, 18 images · non-contrast
Comparison: None.

CLINICAL DATA: Fall with left orbital trauma

EXAM:
CT HEAD AND ORBITS WITHOUT CONTRAST
TECHNIQUE: Contiguous axial images were obtained from the base of the skull
through the vertex without contrast. Multidetector CT imaging of the
orbits was performed using the standard protocol without intravenous
contrast.

[Series 3: orbits 2.0 st · axial · 0.34mm/px · z∈[-96,+4]mm · 9 of 58 slices shown, 12 images]
[im 4/58  brain]
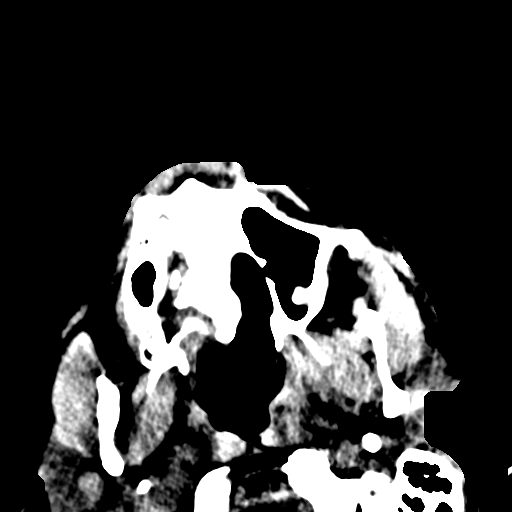
[im 4/58  bone]
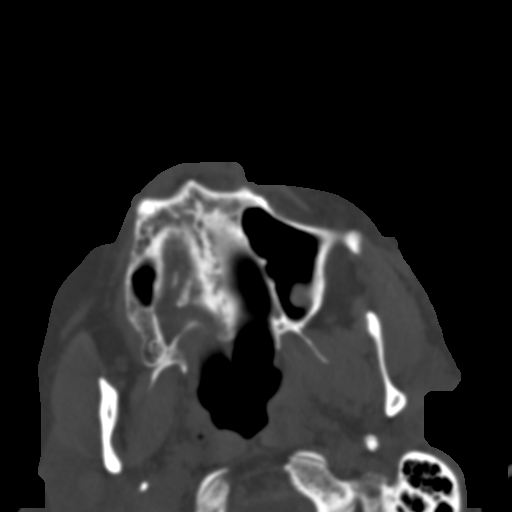
[im 10/58  bone]
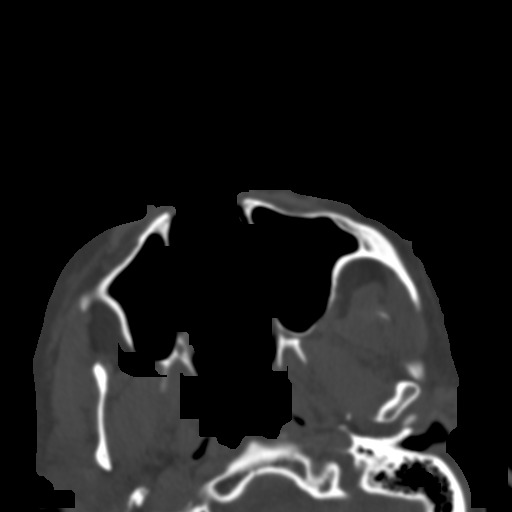
[im 16/58  bone]
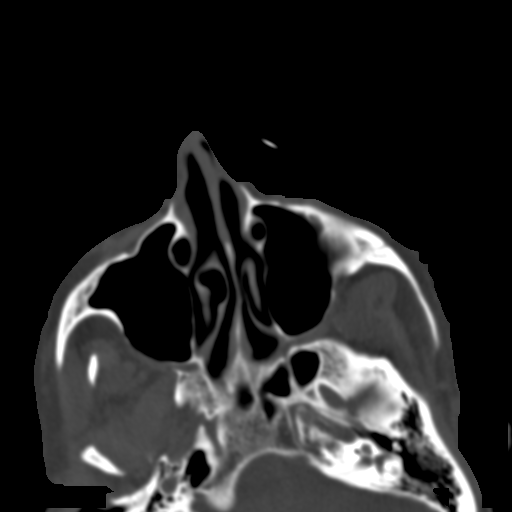
[im 22/58  bone]
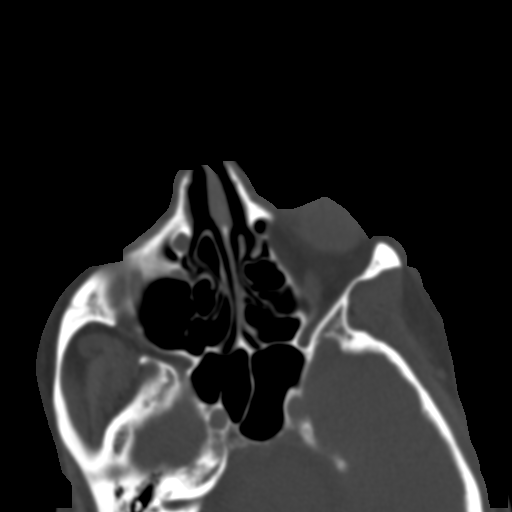
[im 30/58  brain]
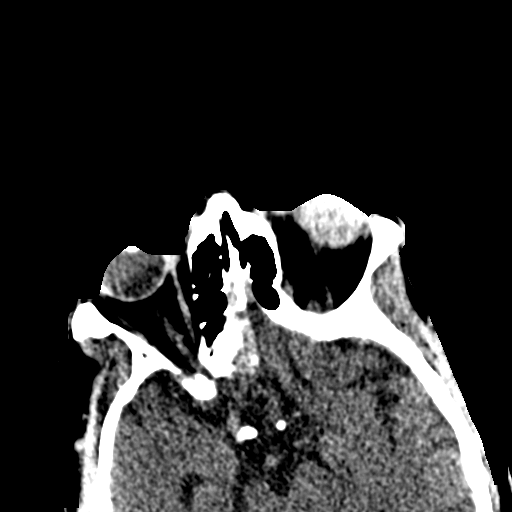
[im 30/58  bone]
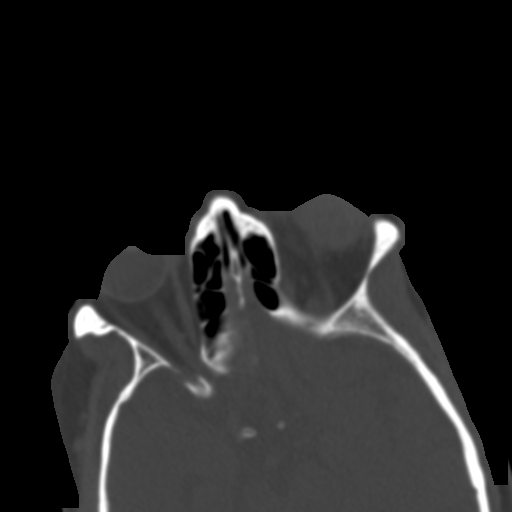
[im 36/58  bone]
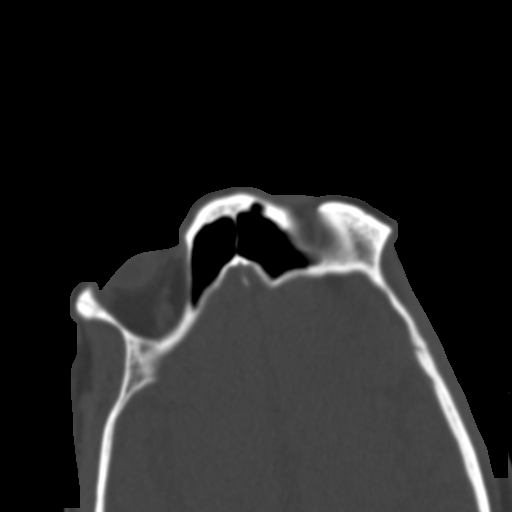
[im 42/58  bone]
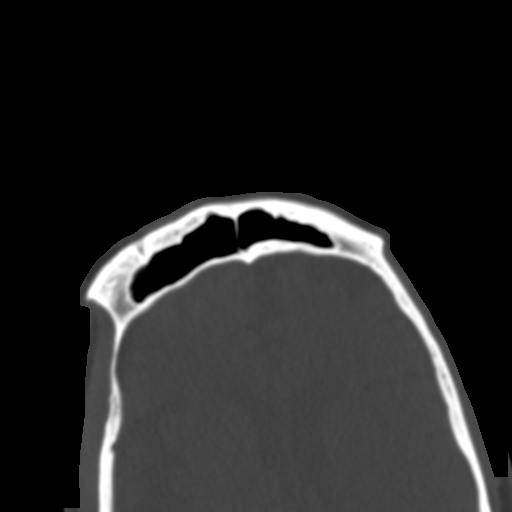
[im 48/58  bone]
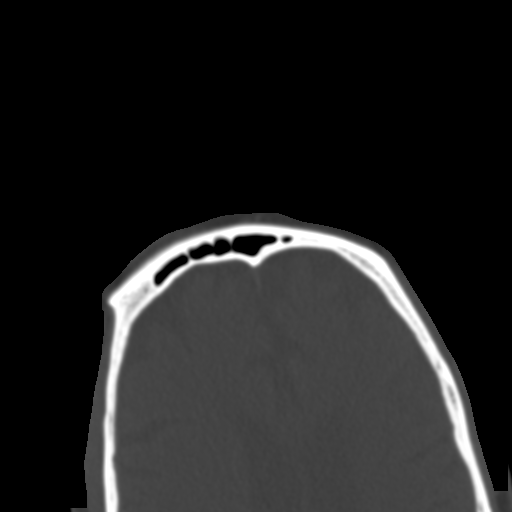
[im 54/58  brain]
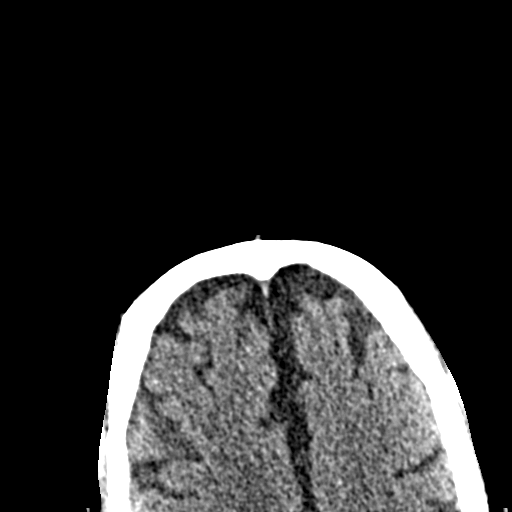
[im 54/58  bone]
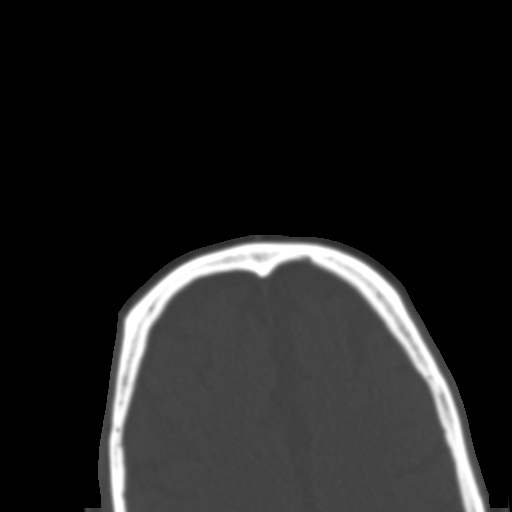

[Series 9: orbits 2.0 cor st · coronal · 0.25mm/px · 3 of 90 slices shown]
[im 30/90  bone]
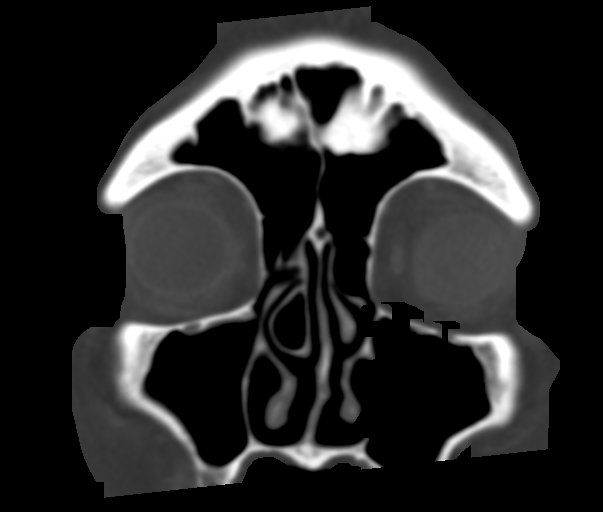
[im 40/90  bone]
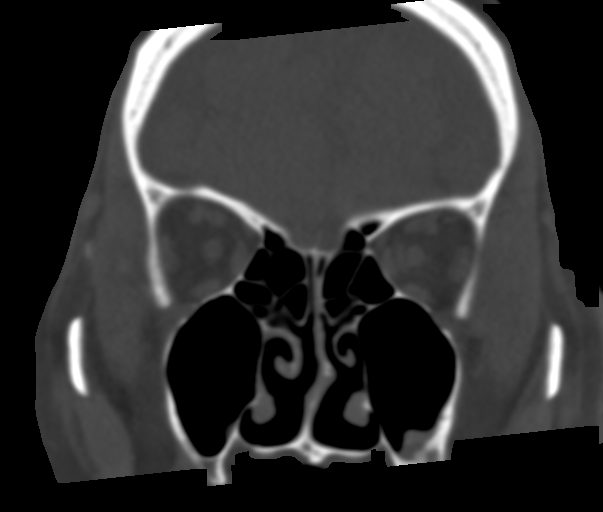
[im 50/90  bone]
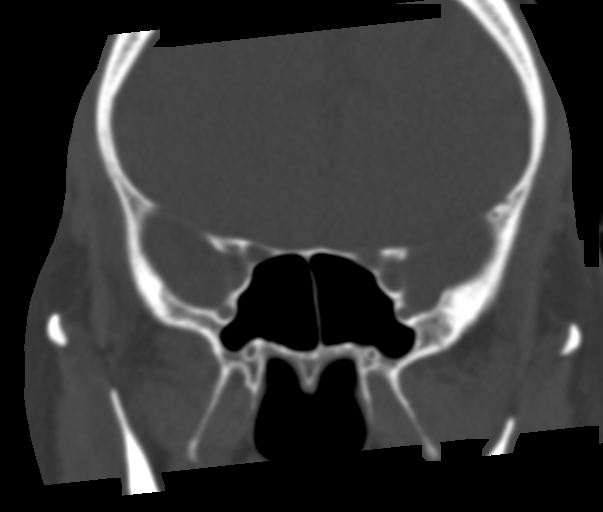

[Series 10: orbits 2.0 sag st · sagittal · 0.24mm/px · 3 of 92 slices shown]
[im 31/92  bone]
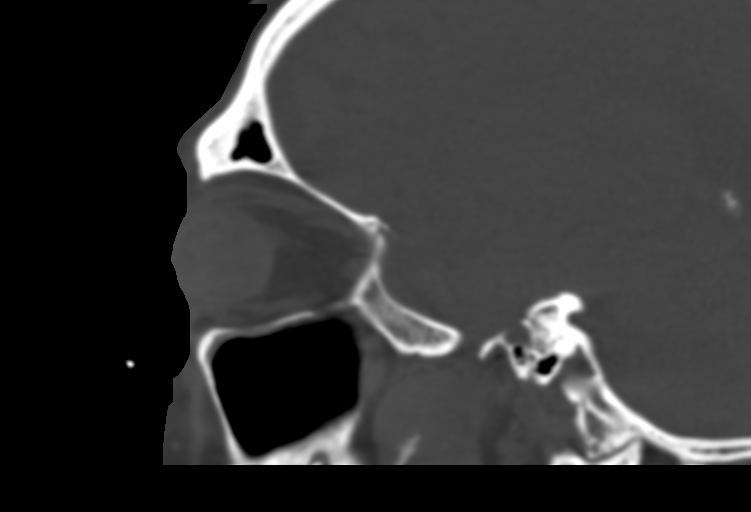
[im 46/92  bone]
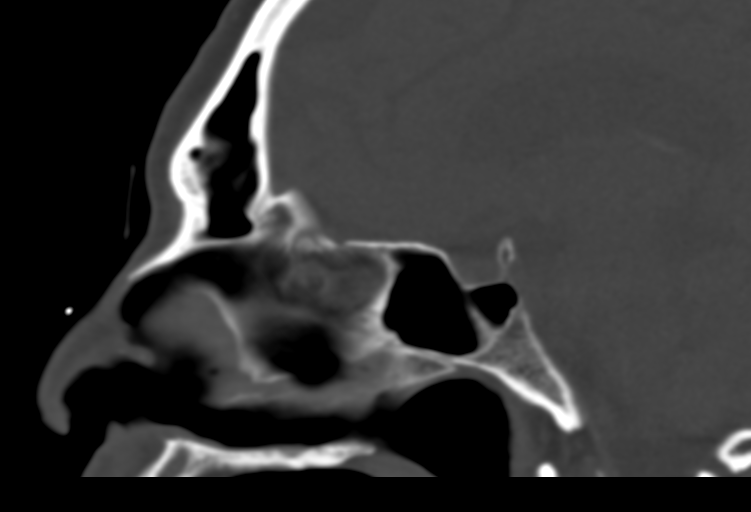
[im 61/92  bone]
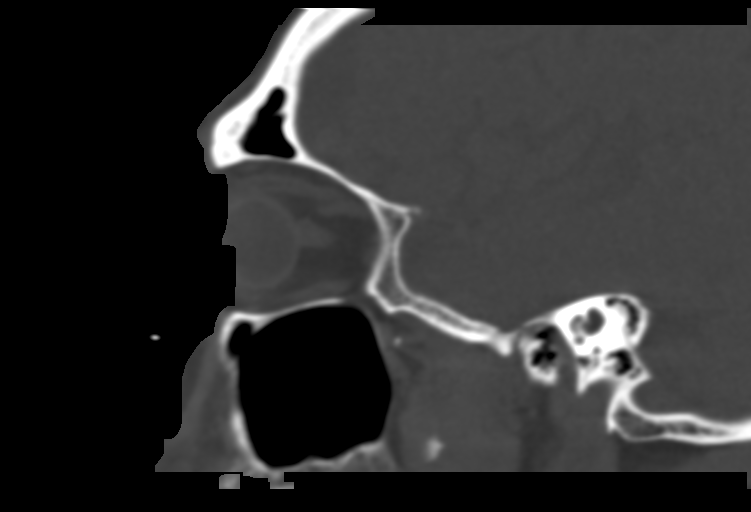

[15 of 47 positions shown; findings below may reference images not displayed]

FINDINGS: CT HEAD FINDINGS

Brain: No evidence of acute infarction, hemorrhage, hydrocephalus,
extra-axial collection or mass lesion/mass effect.

Vascular: No hyperdense vessel or unexpected calcification.

Skull: Normal. Negative for fracture or focal lesion.

Other: None.

CT ORBITS FINDINGS

Orbits: There is heterogeneous density material throughout all
chambers of the left lobe. There is suspected globe disruption along
the anterior aspect best seen series 10, image 63. The left optic
nerve and extraocular muscles are normal. The right orbit is normal.

Visualized sinuses: Clear.

Soft tissues: Mild left periorbital soft tissue swelling.
IMPRESSION: 1. No acute intracranial abnormality.
2. Suspected globe disruption along the anterior aspect of the left
globe with intra-ocular hemorrhage.
3. Mild left periorbital soft tissue swelling.

## 2019-09-18 SURGERY — REPAIR, RUPTURE, GLOBE
Anesthesia: General | Site: Eye | Laterality: Left

## 2019-09-18 MED ORDER — PHENYLEPHRINE 40 MCG/ML (10ML) SYRINGE FOR IV PUSH (FOR BLOOD PRESSURE SUPPORT)
PREFILLED_SYRINGE | INTRAVENOUS | Status: AC
  Filled 2019-09-18: qty 10

## 2019-09-18 MED ORDER — ONDANSETRON HCL 4 MG/2ML IJ SOLN
INTRAMUSCULAR | Status: AC
  Filled 2019-09-18: qty 2

## 2019-09-18 MED ORDER — SODIUM CHLORIDE 0.9 % IV BOLUS
1000.0000 mL | Freq: Once | INTRAVENOUS | Status: AC
  Administered 2019-09-18: 1000 mL via INTRAVENOUS

## 2019-09-18 MED ORDER — CEFTAZIDIME 1 G IJ SOLR
INTRAMUSCULAR | Status: AC
  Filled 2019-09-18: qty 1

## 2019-09-18 MED ORDER — FLUORESCEIN SODIUM 1 MG OP STRP
1.0000 | ORAL_STRIP | Freq: Once | OPHTHALMIC | Status: AC
  Administered 2019-09-18: 1 via OPHTHALMIC
  Filled 2019-09-18: qty 1

## 2019-09-18 MED ORDER — SODIUM CHLORIDE (PF) 0.9 % IJ SOLN
INTRAMUSCULAR | Status: AC
  Filled 2019-09-18: qty 10

## 2019-09-18 MED ORDER — OXYCODONE HCL 5 MG PO TABS: 5.0000 mg | ORAL_TABLET | Freq: Once | ORAL | Status: DC | PRN

## 2019-09-18 MED ORDER — ONDANSETRON HCL 4 MG/2ML IJ SOLN
INTRAMUSCULAR | Status: DC | PRN
  Administered 2019-09-18 – 2019-09-19 (×2): 4 mg via INTRAVENOUS

## 2019-09-18 MED ORDER — NEOMYCIN-POLYMYXIN-DEXAMETH 3.5-10000-0.1 OP OINT
TOPICAL_OINTMENT | OPHTHALMIC | Status: DC | PRN
  Administered 2019-09-18: 1 via OPHTHALMIC

## 2019-09-18 MED ORDER — STERILE WATER FOR IRRIGATION IR SOLN
Status: DC | PRN
  Administered 2019-09-18: 1000 mL

## 2019-09-18 MED ORDER — FENTANYL CITRATE (PF) 100 MCG/2ML IJ SOLN
INTRAMUSCULAR | Status: DC | PRN
Start: 2019-09-18 — End: 2019-09-19
  Administered 2019-09-18 (×2): 50 ug via INTRAVENOUS

## 2019-09-18 MED ORDER — STERILE WATER FOR INJECTION IJ SOLN
INTRAMUSCULAR | Status: AC
  Filled 2019-09-18: qty 10

## 2019-09-18 MED ORDER — FENTANYL CITRATE (PF) 100 MCG/2ML IJ SOLN: 25.0000 ug | INTRAMUSCULAR | Status: DC | PRN

## 2019-09-18 MED ORDER — PROPOFOL 10 MG/ML IV BOLUS
INTRAVENOUS | Status: AC
  Filled 2019-09-18: qty 20

## 2019-09-18 MED ORDER — SODIUM CHLORIDE 0.9 % IV SOLN
2.0000 g | Freq: Once | INTRAVENOUS | Status: AC
  Administered 2019-09-18: 2 g via INTRAVENOUS
  Filled 2019-09-18: qty 2

## 2019-09-18 MED ORDER — ROCURONIUM BROMIDE 10 MG/ML (PF) SYRINGE
PREFILLED_SYRINGE | INTRAVENOUS | Status: DC | PRN
  Administered 2019-09-18: 70 mg via INTRAVENOUS
  Administered 2019-09-18: 10 mg via INTRAVENOUS

## 2019-09-18 MED ORDER — POLYMYXIN B SULFATE 500000 UNITS IJ SOLR
INTRAMUSCULAR | Status: AC
  Filled 2019-09-18: qty 500000

## 2019-09-18 MED ORDER — FENTANYL CITRATE (PF) 250 MCG/5ML IJ SOLN
INTRAMUSCULAR | Status: AC
  Filled 2019-09-18: qty 5

## 2019-09-18 MED ORDER — OXYCODONE HCL 5 MG/5ML PO SOLN: 5.0000 mg | Freq: Once | ORAL | Status: DC | PRN

## 2019-09-18 MED ORDER — PHENYLEPHRINE HCL-NACL 10-0.9 MG/250ML-% IV SOLN
INTRAVENOUS | Status: DC | PRN
  Administered 2019-09-18: 20 ug/min via INTRAVENOUS

## 2019-09-18 MED ORDER — VANCOMYCIN HCL IN DEXTROSE 1-5 GM/200ML-% IV SOLN
1000.0000 mg | Freq: Two times a day (BID) | INTRAVENOUS | Status: DC
  Administered 2019-09-19 – 2019-09-20 (×3): 1000 mg via INTRAVENOUS
  Filled 2019-09-18 (×6): qty 200

## 2019-09-18 MED ORDER — DEXAMETHASONE SODIUM PHOSPHATE 10 MG/ML IJ SOLN
INTRAMUSCULAR | Status: AC
  Filled 2019-09-18: qty 1

## 2019-09-18 MED ORDER — STERILE WATER FOR INJECTION IJ SOLN
INTRAMUSCULAR | Status: DC | PRN
  Administered 2019-09-18: 20 mL

## 2019-09-18 MED ORDER — PROPOFOL 10 MG/ML IV BOLUS
INTRAVENOUS | Status: DC | PRN
  Administered 2019-09-18: 140 mg via INTRAVENOUS

## 2019-09-18 MED ORDER — LACTATED RINGERS IV SOLN: INTRAVENOUS | Status: DC | PRN

## 2019-09-18 MED ORDER — MORPHINE SULFATE (PF) 4 MG/ML IV SOLN
4.0000 mg | Freq: Once | INTRAVENOUS | Status: AC
  Administered 2019-09-18: 4 mg via INTRAVENOUS
  Filled 2019-09-18: qty 1

## 2019-09-18 MED ORDER — LIDOCAINE 2% (20 MG/ML) 5 ML SYRINGE
INTRAMUSCULAR | Status: DC | PRN
  Administered 2019-09-18: 60 mg via INTRAVENOUS

## 2019-09-18 MED ORDER — 0.9 % SODIUM CHLORIDE (POUR BTL) OPTIME
TOPICAL | Status: DC | PRN
  Administered 2019-09-18 (×2): 1000 mL

## 2019-09-18 MED ORDER — TETANUS-DIPHTH-ACELL PERTUSSIS 5-2.5-18.5 LF-MCG/0.5 IM SUSP
0.5000 mL | Freq: Once | INTRAMUSCULAR | Status: AC
  Administered 2019-09-18: 0.5 mL via INTRAMUSCULAR
  Filled 2019-09-18: qty 0.5

## 2019-09-18 MED ORDER — VANCOMYCIN HCL 10 G IV SOLR
2500.0000 mg | Freq: Once | INTRAVENOUS | Status: AC
  Administered 2019-09-18: 2500 mg via INTRAVENOUS
  Filled 2019-09-18: qty 2500

## 2019-09-18 MED ORDER — NA CHONDROIT SULF-NA HYALURON 40-30 MG/ML IO SOLN
INTRAOCULAR | Status: AC
  Filled 2019-09-18: qty 0.5

## 2019-09-18 MED ORDER — NA CHONDROIT SULF-NA HYALURON 40-30 MG/ML IO SOLN
INTRAOCULAR | Status: DC | PRN
  Administered 2019-09-18: 0.5 mL via INTRAOCULAR

## 2019-09-18 MED ORDER — SODIUM HYALURONATE 10 MG/ML IO SOLN
INTRAOCULAR | Status: AC
  Filled 2019-09-18: qty 0.85

## 2019-09-18 MED ORDER — TETRACAINE HCL 0.5 % OP SOLN
2.0000 [drp] | Freq: Once | OPHTHALMIC | Status: AC
  Administered 2019-09-18: 2 [drp] via OPHTHALMIC
  Filled 2019-09-18: qty 4

## 2019-09-18 MED ORDER — ONDANSETRON HCL 4 MG/2ML IJ SOLN: 4.0000 mg | Freq: Once | INTRAMUSCULAR | Status: DC | PRN

## 2019-09-18 MED ORDER — BSS IO SOLN
INTRAOCULAR | Status: DC | PRN
  Administered 2019-09-18: 15 mL via INTRAOCULAR

## 2019-09-18 MED ORDER — NEOMYCIN-POLYMYXIN-DEXAMETH 3.5-10000-0.1 OP OINT
TOPICAL_OINTMENT | OPHTHALMIC | Status: AC
  Filled 2019-09-18: qty 3.5

## 2019-09-18 MED ORDER — DEXAMETHASONE SODIUM PHOSPHATE 10 MG/ML IJ SOLN
INTRAMUSCULAR | Status: DC | PRN
  Administered 2019-09-18: 4 mg via INTRAVENOUS

## 2019-09-18 SURGICAL SUPPLY — 32 items
APL SRG 3 HI ABS STRL LF PLS (MISCELLANEOUS) ×1
APPLICATOR DR MATTHEWS STRL (MISCELLANEOUS) ×2 IMPLANT
BLADE MVR KNIFE 20G (BLADE) ×2 IMPLANT
BNDG EYE OVAL (GAUZE/BANDAGES/DRESSINGS) ×4 IMPLANT
CAUTERY EYE LOW TEMP 1300F FIN (OPHTHALMIC RELATED) IMPLANT
CORD BIPOLAR FORCEPS 12FT (ELECTRODE) ×2 IMPLANT
DRAPE OPHTHALMIC 40X48 W POUCH (DRAPES) ×2 IMPLANT
DRSG TEGADERM 4X4.75 (GAUZE/BANDAGES/DRESSINGS) ×2 IMPLANT
GAUZE SPONGE 4X4 12PLY STRL (GAUZE/BANDAGES/DRESSINGS) ×2 IMPLANT
GLOVE BIO SURGEON STRL SZ7 (GLOVE) ×2 IMPLANT
GLOVE BIO SURGEON STRL SZ7.5 (GLOVE) ×2 IMPLANT
GLOVE BIOGEL PI IND STRL 8 (GLOVE) ×1 IMPLANT
GLOVE BIOGEL PI INDICATOR 8 (GLOVE) ×1
GOWN STRL REUS W/ TWL LRG LVL3 (GOWN DISPOSABLE) ×1 IMPLANT
GOWN STRL REUS W/TWL LRG LVL3 (GOWN DISPOSABLE) ×2
KIT BASIN OR (CUSTOM PROCEDURE TRAY) ×2 IMPLANT
NEEDLE 18GX1X1/2 (RX/OR ONLY) (NEEDLE) ×4 IMPLANT
NS IRRIG 1000ML POUR BTL (IV SOLUTION) ×4 IMPLANT
PACK CATARACT CUSTOM (CUSTOM PROCEDURE TRAY) ×2 IMPLANT
SHIELD EYE LENSE ONLY DISP (GAUZE/BANDAGES/DRESSINGS) ×2 IMPLANT
SPEAR EYE SURG WECK-CEL (MISCELLANEOUS) ×10 IMPLANT
SUT CHROMIC 7 0 TG140 8 (SUTURE) IMPLANT
SUT ETHILON 10 0 BV100 4 (SUTURE) IMPLANT
SUT ETHILON 10 0 CS140 6 (SUTURE) IMPLANT
SUT ETHILON 9 0 TG140 8 (SUTURE) ×4 IMPLANT
SUT VICRYL 8 0 TG140 8 (SUTURE) ×2 IMPLANT
SYR 20ML LL LF (SYRINGE) ×2 IMPLANT
SYR BULB EAR ULCER 3OZ GRN STR (SYRINGE) ×2 IMPLANT
TOWEL GREEN STERILE FF (TOWEL DISPOSABLE) ×4 IMPLANT
TUBE CONNECTING 12X1/4 (SUCTIONS) ×2 IMPLANT
WATER STERILE IRR 1000ML POUR (IV SOLUTION) ×2 IMPLANT
YANKAUER SUCT BULB TIP NO VENT (SUCTIONS) ×2 IMPLANT

## 2019-09-18 NOTE — ED Notes (Signed)
Wife Jase Reep) home number is [phone]. Cell phone is [phone].

## 2019-09-18 NOTE — ED Provider Notes (Signed)
[facility] EMERGENCY DEPARTMENT Provider Note   CSN: [id number] Arrival date & time: 09/18/19  1716     History Chief Complaint  Patient presents with  . Fall    Nicholas Ramirez is a 74 y.o. male with history of hypertension, depression, presented to emergency department with fall and left eye injury.  Patient ports he has had decreased mobility and general fatigue for the past several weeks.  He feels his balance is off and is fallen multiple times at home.  Today he had a mechanical fall, reporting that he tripped over something, and struck his face and left eye on the edge of a dresser the bed.  He reports that at baseline he has "good vision" in both his eyes, but he does wear glasses, but cannot tell me when he wears them or what his prescription is or what he uses them for.  He says he has "cataracts" in his eyes.    EMS reported they visualized bleeding from the left eye and placed an eye patch over the eye.  Patient reports that he is vaccinated for Covid.  HPI     Past Medical History:  Diagnosis Date  . Depression   . Hepatitis C   . Hypertension     Patient Active Problem List   Diagnosis Date Noted  . Essential hypertension 09/18/2019    Past Surgical History:  Procedure Laterality Date  . CARPAL TUNNEL RELEASE Left        No family history on file.  Social History   Tobacco Use  . Smoking status: Never Smoker  . Smokeless tobacco: Never Used  Substance Use Topics  . Alcohol use: Never  . Drug use: Not Currently    Home Medications Prior to Admission medications   Medication Sig Start Date End Date Taking? Authorizing Provider  aspirin 81 MG EC tablet Take 81 mg by mouth daily.    [provider]  carbidopa-levodopa (SINEMET IR) 25-100 MG tablet Take 1/2 tablet in AM and bedtime for one week, then 1/2 tablet three times daily for one week, then take 1 tablet three times daily. 09/16/19   Pieter Partridge, DO  cetirizine  (ZYRTEC) 10 MG tablet Take 10 mg by mouth daily.    [provider]  DULoxetine (CYMBALTA) 60 MG capsule Take 60 mg by mouth daily. 07/03/19   [provider]  hydrochlorothiazide (HYDRODIURIL) 25 MG tablet Take 25 mg by mouth daily. 07/04/19   [provider]  lisinopril (ZESTRIL) 40 MG tablet Take 40 mg by mouth daily. 08/05/19   [provider]  MYRBETRIQ 50 MG TB24 tablet Take 50 mg by mouth daily. 09/02/19   [provider]  propranolol (INDERAL) 40 MG tablet Take 40 mg by mouth 2 (two) times daily. 09/04/19   [provider]    Allergies    Patient has no known allergies.  Review of Systems   Review of Systems  Constitutional: Positive for fatigue. Negative for chills and fever.  HENT: Negative for ear pain and sore throat.   Eyes: Positive for pain, discharge, itching and visual disturbance.  Respiratory: Negative for cough and shortness of breath.   Cardiovascular: Negative for chest pain and palpitations.  Gastrointestinal: Negative for abdominal pain and vomiting.  Genitourinary: Negative for dysuria and hematuria.  Musculoskeletal: Negative for arthralgias and back pain.  Skin: Negative for color change and rash.  Neurological: Positive for headaches. Negative for syncope and speech difficulty.  Psychiatric/Behavioral: Negative  for agitation and confusion.  All other systems reviewed and are negative.   Physical Exam Updated Vital Signs BP (!) 157/107   Pulse 72   Temp 98.4 F (36.9 C) (Oral)   Resp 12   Ht 5\' 9"  (1.753 m)   Wt 111.1 kg   SpO2 95%   BMI 36.18 kg/m   Physical Exam Vitals and nursing note reviewed.  Constitutional:      Appearance: He is well-developed and normal weight.  HENT:     Head: Normocephalic.     Comments: Ocular exam as noted in ED course below Cardiovascular:     Rate and Rhythm: Normal rate and regular rhythm.     Pulses: Normal pulses.  Pulmonary:     Effort: Pulmonary effort is  normal. No respiratory distress.  Abdominal:     General: There is no distension.     Palpations: Abdomen is soft.     Tenderness: There is no abdominal tenderness.  Musculoskeletal:     Cervical back: Neck supple.  Skin:    General: Skin is warm and dry.  Neurological:     General: No focal deficit present.     Mental Status: He is alert and oriented to person, place, and time.      ED Results / Procedures / Treatments   Labs (all labs ordered are listed, but only abnormal results are displayed) Labs Reviewed  BASIC METABOLIC PANEL - Abnormal; Notable for the following components:      Result Value   Sodium 123 (*)    Chloride 91 (*)    Glucose, Bld 101 (*)    BUN 6 (*)    Calcium 7.9 (*)    All other components within normal limits  CBC WITH DIFFERENTIAL/PLATELET - Abnormal; Notable for the following components:   RBC 3.60 (*)    Hemoglobin 12.6 (*)    HCT 34.8 (*)    MCH 35.0 (*)    MCHC 36.2 (*)    RDW 20.0 (*)    Platelets 103 (*)    Abs Immature Granulocytes 0.08 (*)    All other components within normal limits  PROTIME-INR - Abnormal; Notable for the following components:   Prothrombin Time 15.4 (*)    INR 1.3 (*)    All other components within normal limits  SARS CORONAVIRUS 2 BY RT PCR (HOSPITAL ORDER, [facility])  TROPONIN I (HIGH SENSITIVITY)  TROPONIN I (HIGH SENSITIVITY)    EKG EKG Interpretation  Date/Time:  Wednesday September 18 2019 18:49:35 EDT Ventricular Rate:  67 PR Interval:    QRS Duration: 144 QT Interval:  485 QTC Calculation: 513 R Axis:   19 Text Interpretation: Sinus rhythm Consider left atrial enlargement Right bundle branch block Baseline wander in lead(s) V6 No STEMI Confirmed by Octaviano Glow [phone]) on 09/18/2019 7:09:00 PM   Radiology CT Head Wo Contrast  Result Date: 09/18/2019 CLINICAL DATA:  Fall with left orbital trauma EXAM: CT HEAD AND ORBITS WITHOUT CONTRAST TECHNIQUE: Contiguous axial  images were obtained from the base of the skull through the vertex without contrast. Multidetector CT imaging of the orbits was performed using the standard protocol without intravenous contrast. COMPARISON:  None. FINDINGS: CT HEAD FINDINGS Brain: No evidence of acute infarction, hemorrhage, hydrocephalus, extra-axial collection or mass lesion/mass effect. Vascular: No hyperdense vessel or unexpected calcification. Skull: Normal. Negative for fracture or focal lesion. Other: None. CT ORBITS FINDINGS Orbits: There is heterogeneous density material throughout all chambers of  the left lobe. There is suspected globe disruption along the anterior aspect best seen series 10, image 63. The left optic nerve and extraocular muscles are normal. The right orbit is normal. Visualized sinuses: Clear. Soft tissues: Mild left periorbital soft tissue swelling. IMPRESSION: 1. No acute intracranial abnormality. 2. Suspected globe disruption along the anterior aspect of the left globe with intra-ocular hemorrhage. 3. Mild left periorbital soft tissue swelling. Electronically Signed   By: Ulyses Jarred M.D.   On: 09/18/2019 19:46   CT Orbits Wo Contrast  Result Date: 09/18/2019 CLINICAL DATA:  Fall with left orbital trauma EXAM: CT HEAD AND ORBITS WITHOUT CONTRAST TECHNIQUE: Contiguous axial images were obtained from the base of the skull through the vertex without contrast. Multidetector CT imaging of the orbits was performed using the standard protocol without intravenous contrast. COMPARISON:  None. FINDINGS: CT HEAD FINDINGS Brain: No evidence of acute infarction, hemorrhage, hydrocephalus, extra-axial collection or mass lesion/mass effect. Vascular: No hyperdense vessel or unexpected calcification. Skull: Normal. Negative for fracture or focal lesion. Other: None. CT ORBITS FINDINGS Orbits: There is heterogeneous density material throughout all chambers of the left lobe. There is suspected globe disruption along the anterior  aspect best seen series 10, image 63. The left optic nerve and extraocular muscles are normal. The right orbit is normal. Visualized sinuses: Clear. Soft tissues: Mild left periorbital soft tissue swelling. IMPRESSION: 1. No acute intracranial abnormality. 2. Suspected globe disruption along the anterior aspect of the left globe with intra-ocular hemorrhage. 3. Mild left periorbital soft tissue swelling. Electronically Signed   By: Ulyses Jarred M.D.   On: 09/18/2019 19:46    Procedures Procedures (including critical care time)  Medications Ordered in ED Medications  vancomycin (VANCOCIN) 2,500 mg in sodium chloride 0.9 % 500 mL IVPB (2,500 mg Intravenous New Bag/Given 09/18/19 2043)  vancomycin (VANCOCIN) IVPB 1000 mg/200 mL premix (has no administration in time range)  fluorescein ophthalmic strip 1 strip (1 strip Both Eyes Given 09/18/19 1804)  tetracaine (PONTOCAINE) 0.5 % ophthalmic solution 2 drop (2 drops Both Eyes Given 09/18/19 1803)  Tdap (BOOSTRIX) injection 0.5 mL (0.5 mLs Intramuscular Given 09/18/19 1804)  morphine 4 MG/ML injection 4 mg (4 mg Intravenous Given 09/18/19 1827)  cefTAZidime (FORTAZ) 2 g in sodium chloride 0.9 % 100 mL IVPB (0 g Intravenous Stopped 09/18/19 1916)  sodium chloride 0.9 % bolus 1,000 mL (1,000 mLs Intravenous New Bag/Given 09/18/19 1927)    ED Course  I have reviewed the triage vital signs and the nursing notes.  Pertinent labs & imaging results that were available during my care of the patient were reviewed by me and considered in my medical decision making (see chart for details).  This is a 74 year old male presented to emergency department with concern for left globe rupture after mechanical fall at home, striking his eye on the edge of a hard surface.  Additional history was obtained by the patient's wife, tells me on the phone that he has had frequent falls for several months, they have had an extensive outpatient evaluation for this.  It is not clear  what is driving these falls.  She says he tripped today and struck his eye on the edge of a safe.  Eye exam is highly concerning for globe rupture of the left eye.  He seems to have extravasated ocular contents.  Plan to give antibiotics IV vancomycin and Tressie Ellis as broad spectrum coverage, update tetanus, given pain medications, emergently consult ophthalmology, anticipate he will need admission.  We have also arranged for a syncope evaluation.  His EKG here does not show any evidence of acute coronary syndrome per my interpretation.  His CBC shows no leukocytosis or significant anemia.  His BMP does show some hyponatremia, which may be due to poor poor p.o. intake.  We will give him a liter of NS fluids for this.  Covid test is negative (screening).  [place] shows no acute ICH.  I personally reviewed the patient's labs, obtain additional history, and reassess the patient.  His pain was improved after IV morphine.  I consulted ophthalmology who is planning for operative intervention.   Clinical Course as of Sep 18 2054  Wed Sep 18, 2019  1808 Bedside exam vision 20/30 in right eye, vision to motion only in left eye, pupil is mishapen with teardrop, no proptosis of the eye, flourescene stain with lacerations noted over the pupil with no active extravasation of ocular contents.  Tonopen testing deferred given concern for globe rupture.  Will contact [facility].  Ordered vanco + fortaz for empiric coverage, 4 mg morphine IV for pain.   [MT]  [place] to Dr Eulas Post from [facility] who states he will be coming to evaluate patient   [MT]  1911 Dr Eulas Post at bedside, voices concern for orbital rupture, okay with CT imaging and keep patient NPO   [MT]  2011 Admitted to hospitalist   [MT]    Clinical Course User Index [MT] Wyvonnia Dusky, MD    Final Clinical Impression(s) / ED Diagnoses Final diagnoses:  Ruptured globe of left eye, initial encounter  Fall, initial encounter    Rx / DC Orders ED  Discharge Orders    None       Wyvonnia Dusky, MD 09/18/19 2057

## 2019-09-18 NOTE — ED Notes (Signed)
Pt states unable to see from left eye

## 2019-09-18 NOTE — Progress Notes (Signed)
Pharmacy Antibiotic Note  Nicholas Ramirez is a 74 y.o. male admitted on 09/18/2019 with globe rupture.  Pharmacy has been consulted for empiric vancomycin dosing.  Patient had a recent fall with trauma to his left eye, concern for open globe/scleral rupture. He is currently unable to see from his eye. He is afebrile without leukocytosis. SBP elevated ~150. Kidney function WNL.   Estimated AUC ~489.  Plan: Vancomycin 2,500mg  IV x1 Vancomycin 1,000mg  IV every 12 hours.  Goal trough 15-20 mcg/mL. Follow up with cultures, antibiotic de-escalation and LOT Monitor renal function and clinical progress  Height: 5\' 9"  (175.3 cm) Weight: 111.1 kg (245 lb) IBW/kg (Calculated) : 70.7  Temp (24hrs), Avg:98.4 F (36.9 C), Min:98.4 F (36.9 C), Max:98.4 F (36.9 C)  No results for input(s): WBC, CREATININE, LATICACIDVEN, VANCOTROUGH, VANCOPEAK, VANCORANDOM, GENTTROUGH, GENTPEAK, GENTRANDOM, TOBRATROUGH, TOBRAPEAK, TOBRARND, AMIKACINPEAK, AMIKACINTROU, AMIKACIN in the last 168 hours.  CrCl cannot be calculated (No successful lab value found.).    No Known Allergies  Antimicrobials this admission: 9/15 vancomycin >>  9/15 ceftazidime >>  Dose adjustments this admission: N/a  Microbiology results: N/a  Thank you for allowing pharmacy to be a part of this patient's care.  Mercy Riding, PharmD PGY1 Acute Care Pharmacy Resident Please refer to [facility] for unit-specific pharmacist

## 2019-09-18 NOTE — Anesthesia Procedure Notes (Signed)
Procedure Name: Intubation Date/Time: 09/18/2019 9:52 PM Performed by: Inda Coke, CRNA Pre-anesthesia Checklist: Patient identified, Emergency Drugs available, Suction available and Patient being monitored Patient Re-evaluated:Patient Re-evaluated prior to induction Oxygen Delivery Method: Circle System Utilized Preoxygenation: Pre-oxygenation with 100% oxygen Induction Type: IV induction Ventilation: Mask ventilation without difficulty and Oral airway inserted - appropriate to patient size Laryngoscope Size: Mac and 4 Grade View: Grade II Tube type: Oral Tube size: 7.5 mm Number of attempts: 1 Airway Equipment and Method: Stylet and Oral airway Placement Confirmation: ETT inserted through vocal cords under direct vision,  positive ETCO2 and breath sounds checked- equal and bilateral Secured at: 23 cm Tube secured with: Tape Dental Injury: Teeth and Oropharynx as per pre-operative assessment

## 2019-09-18 NOTE — ED Notes (Signed)
Requested phlebotomist Philippa Chester for blood draw. Unable to obtain labs from IV

## 2019-09-18 NOTE — ED Notes (Signed)
Report given pt to go to [facility]

## 2019-09-18 NOTE — Anesthesia Preprocedure Evaluation (Addendum)
Anesthesia Evaluation  Patient identified by MRN, date of birth, ID band Patient awake    Reviewed: Allergy & Precautions, NPO status , Patient's Chart, lab work & pertinent test results, reviewed documented beta blocker date and time   History of Anesthesia Complications Negative for: history of anesthetic complications  Airway Mallampati: II  TM Distance: >3 FB Neck ROM: Full    Dental  (+) Dental Advisory Given   Pulmonary former smoker,    Pulmonary exam normal        Cardiovascular hypertension, Pt. on medications and Pt. on home beta blockers Normal cardiovascular exam     Neuro/Psych PSYCHIATRIC DISORDERS Depression  Being worked up for Parkinsons disease and empirically treated for such      GI/Hepatic negative GI ROS, (+) Hepatitis -, C  Endo/Other   Obesity Hyponatremia, Na 123 (unclear chronicity, most recent labs were normal in 2018. Could be related to recent falls, weakness) Hypocalcemia, Ca 7.9 Hypochloremia, Cl 91   Renal/GU negative Renal ROS     Musculoskeletal negative musculoskeletal ROS (+)   Abdominal   Peds  Hematology  (+) anemia ,  Thrombocytopenia, Plt 103k INR 1.3    Anesthesia Other Findings Covid test negative Frequent falls recently   Reproductive/Obstetrics                           Anesthesia Physical Anesthesia Plan  ASA: III and emergent  Anesthesia Plan: General   Post-op Pain Management:    Induction: Intravenous and Rapid sequence  PONV Risk Score and Plan: 2 and Treatment may vary due to age or medical condition, Ondansetron and Dexamethasone  Airway Management Planned: Oral ETT  Additional Equipment: None  Intra-op Plan:   Post-operative Plan: Extubation in OR  Informed Consent: I have reviewed the patients History and Physical, chart, labs and discussed the procedure including the risks, benefits and alternatives for the  proposed anesthesia with the patient or authorized representative who has indicated his/her understanding and acceptance.     Dental advisory given  Plan Discussed with: CRNA and Anesthesiologist  Anesthesia Plan Comments:       Anesthesia Quick Evaluation

## 2019-09-18 NOTE — ED Notes (Signed)
Patient transported to CT 

## 2019-09-18 NOTE — ED Triage Notes (Signed)
Pt coming from home. Has had a decrease in mobility the past few weeks. Multiple falls over the past week. Pt had a mechanical fall between the dresser and bed and hit left eye. Injury and bleeding noted from left eye. No LOC, no neck or back pain. Pt is not on blood thinners. Has limited movement to left arm before falls.

## 2019-09-18 NOTE — H&P (Signed)
History and Physical    Nicholas Ramirez RDE:[id number] DOB: 1945-12-21 DOA: 09/18/2019  PCP: Townsend Roger, MD   Patient coming from: Home  Chief Complaint: Fall at home and hit face on furniture. Can't see out of left eye  HPI: Nicholas Ramirez is a 74 y.o. male with medical history significant for HTN, heart murmur, cataracts, hepatitis C who presents by EMS after a fall at home.  Reports he has had multiple falls over the last few months at home.  He has been evaluated at outpatient neurology has had CT scan and MRI of his brain performed.  He is scheduled for an MRI of his C-spine in the near future the neurologist ordered.  He reports he has had generalized weakness with a slow unsteady gait.  His daughter is at bedside that lives out of state and has not seen him walking recently and is unsure if he has a shuffling gait.  He was recently started on levodopa carbidopa for possible Parkinson's disease by neurology.  Reports he was at home tonight when he tripped over something and fell and hit the left side of his head on his dresser as he fell.  He sustained a laceration to the side of his eye and hit his eye.  He was unable to see and EMS was called.  He reports that he had normal vision with his glasses on in both eyes until this fall.  He reports he cannot see out of the left eye after his fall.  He was found to have hyphema and possible hematoma in left eye on CT scan.  He has not had any chest pain palpitation, nausea, vomiting or diarrhea, shortness of breath, cough.  He denies any loss of consciousness with the fall  ED Course: CT scan of his orbits shows probable rupture of anterior left globe.  Ophthalmology has been consulted and evaluated patient and plans for surgical intervention to try to save the eye.  Patient was given broad-spectrum antibiotics in the emergency room as well as a tetanus shot  Review of Systems:  General: Reports generalized weakness. Denies fever, chills,  weight loss, night sweats.  Denies dizziness.  Denies change in appetite HENT: Denies head trauma, headache, denies change in hearing, tinnitus.  Denies nasal congestion or bleeding.  Denies sore throat, sores in mouth.  Denies difficulty swallowing Eyes: Denies blurry vision, pain in eye, drainage.  Denies discoloration of eyes. Neck: Denies pain.  Denies swelling.  Denies pain with movement. Cardiovascular: Denies chest pain, palpitations.  Reports mild chronic edema.  Denies orthopnea Respiratory: Denies shortness of breath, cough.  Denies wheezing.  Denies sputum production Gastrointestinal: Denies abdominal pain, swelling.  Denies nausea, vomiting, diarrhea.  Denies melena.  Denies hematemesis. Musculoskeletal: Denies limitation of movement.  Denies deformity or swelling.  Denies pain.  Denies arthralgias or myalgias. Genitourinary: Denies pelvic pain.  Denies urinary frequency or hesitancy.  Denies dysuria.  Skin: Denies rash.  Denies petechiae, purpura, ecchymosis. Neurological: Denies headache.  Denies syncope.  Denies seizure activity. reports weakness but denies paresthesia.  Denies slurred speech, drooping face.  Has had slow unsteady gait past few months. Has had multiple falls at home Psychiatric: Denies depression, anxiety.  Denies suicidal thoughts or ideation.  Denies hallucinations.  Past Medical History:  Diagnosis Date  . Asthma   . Depression   . Heart murmur   . Hepatitis C   . Hypertension     Past Surgical History:  Procedure Laterality Date  .  CARPAL TUNNEL RELEASE Left     Social History  reports that he quit smoking about 34 years ago. He has never used smokeless tobacco. He reports previous drug use. He reports that he does not drink alcohol.  No Known Allergies  History reviewed. No pertinent family history.   Prior to Admission medications   Medication Sig Start Date End Date Taking? Authorizing Provider  aspirin 81 MG EC tablet Take 81 mg by mouth  daily.    [provider]  carbidopa-levodopa (SINEMET IR) 25-100 MG tablet Take 1/2 tablet in AM and bedtime for one week, then 1/2 tablet three times daily for one week, then take 1 tablet three times daily. 09/16/19   Pieter Partridge, DO  cetirizine (ZYRTEC) 10 MG tablet Take 10 mg by mouth daily.    [provider]  DULoxetine (CYMBALTA) 60 MG capsule Take 60 mg by mouth daily. 07/03/19   [provider]  hydrochlorothiazide (HYDRODIURIL) 25 MG tablet Take 25 mg by mouth daily. 07/04/19   [provider]  lisinopril (ZESTRIL) 40 MG tablet Take 40 mg by mouth daily. 08/05/19   [provider]  MYRBETRIQ 50 MG TB24 tablet Take 50 mg by mouth daily. 09/02/19   [provider]  propranolol (INDERAL) 40 MG tablet Take 40 mg by mouth 2 (two) times daily. 09/04/19   [provider]    Physical Exam: Vitals:   09/18/19 1930 09/18/19 2000 09/18/19 2015 09/18/19 2030  BP: (!) 190/96 (!) 158/105 (!) 188/102 (!) 157/107  Pulse: 67 69 72 72  Resp: 10 11 13 12   Temp:      TempSrc:      SpO2: 94% 95% 94% 95%  Weight:      Height:        Constitutional: NAD, calm, comfortable Vitals:   09/18/19 1930 09/18/19 2000 09/18/19 2015 09/18/19 2030  BP: (!) 190/96 (!) 158/105 (!) 188/102 (!) 157/107  Pulse: 67 69 72 72  Resp: 10 11 13 12   Temp:      TempSrc:      SpO2: 94% 95% 94% 95%  Weight:      Height:       General: WDWN, Alert and oriented x3.  Eyes: Normal ocular muscle movements of right eye.  PERRL on right, Sclera nonicteric on right.  Left eye is patched by ophthalmology.  Left eyelid swollen HENT:  [place]/AT, external ears normal.  Nares patent without epistasis.  Mucous membranes are moist. Posterior pharynx clear of any exudate or lesions. Neck: Soft, normal range of motion, supple, no masses, no thyromegaly.  Trachea midline Respiratory: clear to auscultation bilaterally, no wheezing, no crackles. Normal respiratory effort. No  accessory muscle use.  Cardiovascular: Regular rate and rhythm, has 4/6 systolic murmur. No rubs / gallops. Mild lower extremity edema. 1+ pedal pulses.  Abdomen: Soft, no tenderness, nondistended, no rebound or guarding.  No masses palpated. Bowel sounds normoactive Musculoskeletal: FROM. no clubbing / cyanosis. No joint deformity upper and lower extremities. Normal muscle tone.  Skin: Warm, dry, intact no rashes, lesions, ulcers. No induration Neurologic: CN 2-12 grossly intact.  Normal speech.  Sensation intact, patella DTR +1 bilaterally. Strength 4/5 in all extremities.  Intention tremor left arm  Psychiatric: Normal judgment and insight.  Normal mood.    Labs on Admission: I have personally reviewed following labs and imaging studies  CBC: Recent Labs  Lab 09/18/19 1811  WBC 6.8  NEUTROABS 3.7  HGB 12.6*  HCT 34.8*  MCV 96.7  PLT 103*    Basic Metabolic Panel: Recent Labs  Lab 09/18/19 1811  NA 123*  K 3.5  CL 91*  CO2 22  GLUCOSE 101*  BUN 6*  CREATININE 0.80  CALCIUM 7.9*    GFR: Estimated Creatinine Clearance: 102.6 mL/min (by C-G formula based on SCr of 0.8 mg/dL).  Liver Function Tests: No results for input(s): AST, ALT, ALKPHOS, BILITOT, PROT, ALBUMIN in the last 168 hours.  Urine analysis: No results found for: COLORURINE, APPEARANCEUR, LABSPEC, PHURINE, GLUCOSEU, HGBUR, BILIRUBINUR, KETONESUR, PROTEINUR, UROBILINOGEN, NITRITE, LEUKOCYTESUR  Radiological Exams on Admission: CT Head Wo Contrast  Result Date: 09/18/2019 CLINICAL DATA:  Fall with left orbital trauma EXAM: CT HEAD AND ORBITS WITHOUT CONTRAST TECHNIQUE: Contiguous axial images were obtained from the base of the skull through the vertex without contrast. Multidetector CT imaging of the orbits was performed using the standard protocol without intravenous contrast. COMPARISON:  None. FINDINGS: CT HEAD FINDINGS Brain: No evidence of acute infarction, hemorrhage, hydrocephalus, extra-axial  collection or mass lesion/mass effect. Vascular: No hyperdense vessel or unexpected calcification. Skull: Normal. Negative for fracture or focal lesion. Other: None. CT ORBITS FINDINGS Orbits: There is heterogeneous density material throughout all chambers of the left lobe. There is suspected globe disruption along the anterior aspect best seen series 10, image 63. The left optic nerve and extraocular muscles are normal. The right orbit is normal. Visualized sinuses: Clear. Soft tissues: Mild left periorbital soft tissue swelling. IMPRESSION: 1. No acute intracranial abnormality. 2. Suspected globe disruption along the anterior aspect of the left globe with intra-ocular hemorrhage. 3. Mild left periorbital soft tissue swelling. Electronically Signed   By: Ulyses Jarred M.D.   On: 09/18/2019 19:46   CT Orbits Wo Contrast  Result Date: 09/18/2019 CLINICAL DATA:  Fall with left orbital trauma EXAM: CT HEAD AND ORBITS WITHOUT CONTRAST TECHNIQUE: Contiguous axial images were obtained from the base of the skull through the vertex without contrast. Multidetector CT imaging of the orbits was performed using the standard protocol without intravenous contrast. COMPARISON:  None. FINDINGS: CT HEAD FINDINGS Brain: No evidence of acute infarction, hemorrhage, hydrocephalus, extra-axial collection or mass lesion/mass effect. Vascular: No hyperdense vessel or unexpected calcification. Skull: Normal. Negative for fracture or focal lesion. Other: None. CT ORBITS FINDINGS Orbits: There is heterogeneous density material throughout all chambers of the left lobe. There is suspected globe disruption along the anterior aspect best seen series 10, image 63. The left optic nerve and extraocular muscles are normal. The right orbit is normal. Visualized sinuses: Clear. Soft tissues: Mild left periorbital soft tissue swelling. IMPRESSION: 1. No acute intracranial abnormality. 2. Suspected globe disruption along the anterior aspect of the  left globe with intra-ocular hemorrhage. 3. Mild left periorbital soft tissue swelling. Electronically Signed   By: Ulyses Jarred M.D.   On: 09/18/2019 19:46    EKG: Independently reviewed.  EKG is reviewed.  Normal sinus rhythm with left atrial enlargement.  Right bundle branch block noted.  QTc prolonged at 513  Assessment/Plan Principal Problem:   Ruptured globe of left eye Mr. Berberian is admitted to medical/surgical floor. He has been evaluated by ophthalmology, Dr. Eulas Post who is consented patient go to the operating room and try to save his eye through surgical intervention.  Prognosis is not good to be able to save the vision though. Continue broad-spectrum antibiotic coverage with start in the emergency room overnight.  Patient to continue long-term antibiotics will be reevaluated after surgical intervention of ruptured globe  with input from neurology and pharmacy.  Active Problems:   Hyponatremia Pt with low sodium level of 123 on lab work. No old labs to compare to. Will check urine sodium and urine molality. IV fluid hydration with normal saline 125 mils per hour overnight. Check electrolytes renal function morning    Essential hypertension  home antihypertensives will be verified and reconciled by pharmacy and resumed.  Monitor blood pressure.    Prolonged QT interval QTC is prolonged at 513.  Avoid medications that can further prolong QTc.    Fall at home, initial encounter Consult physical therapy for evaluation in the morning. Patient been evaluate by outpatient neurology for frequent falls at home recently with possible Parkinson's disease.  Patient is scheduled for an outpatient MRI of the cervical spine per neurology.  Will obtain MRI of cervical spine in the morning as patient already be in the hospital and results can be sent to neurology.    DVT prophylaxis: SCDs for DVT prophylaxis.  Anticoagulation is held with intraocular hemorrhage after fall and injury to eye  and plan for surgical intervention tonight or tomorrow morning. Code Status:   Full code Family Communication:  Diagnosed plan discussed with patient and his daughter who is at bedside.  Questions were answered.  They verbalized understanding agree with plan.  Further agrees to follow as clinical indicated. Disposition Plan:   Patient is from:  Home  Anticipated DC to:  Home versus SNF for rehab  Anticipated DC date:  Anticipate greater than 2 midnight stay in the hospital to treat acute medical condition  Anticipated DC barriers: Barriers to discharge would be if patient will need services greater than home health and will need SNF placement  Consults called:  Ophthalmology who is seen patient in the emergency room, Dr. Eulas Post. Admission status:  Inpatient  Severity of Illness: The appropriate patient status for this patient is INPATIENT. Inpatient status is judged to be reasonable and necessary in order to provide the required intensity of service to ensure the patient's safety. The patient's presenting symptoms, physical exam findings, and initial radiographic and laboratory data in the context of their chronic comorbidities is felt to place them at high risk for further clinical deterioration. Furthermore, it is not anticipated that the patient will be medically stable for discharge from the hospital within 2 midnights of admission. The following factors support the patient status of inpatient.   * I certify that at the point of admission it is my clinical judgment that the patient will require inpatient hospital care spanning beyond 2 midnights from the point of admission due to high intensity of service, high risk for further deterioration and high frequency of surveillance required.Yevonne Aline Deanda Ruddell MD [facility]  How to contact the [facility] Attending or Consulting provider [place] or covering provider during after hours [place], for this patient?   1. Check the care team in [facility]  and look for a) attending/consulting [facility] provider listed and b) the [facility] team listed 2. Log into [email or web address] and use [place]'s universal password to access. If you do not have the password, please contact the hospital operator. 3. Locate the [facility] provider you are looking for under [facility] and page to a number that you can be directly reached. 4. If you still have difficulty reaching the provider, please page the [facility] (Director on Call) for the Hospitalists listed on amion for assistance.  09/18/2019, 9:17 PM

## 2019-09-18 NOTE — ED Notes (Signed)
Gala Romney, MD at bedside explaining surgical consent, this RN at bedside as witness.

## 2019-09-18 NOTE — Consult Note (Signed)
Chief Complaint/Reason for Consultation:   HPI: 74 yo M with recent history presents to ED with recent fall and trauma to OS. Concern for open globe from ED. The incident happened around 4pm. The patient c/o severe pain, discharge in the OS and complete loss of vision. Patient notes he has a history of cataracts OU, though no prior eye surgery.  Last eye exam was 2019. Has glasses, but doesn't wear them frequently. Has not had a tetanus immunization in the last 5 years.  Last meal: Ate lunch, Pimento cheese sandwich just before noon. Additionally the patient ate some grapes around 3pm..    Patient has been having frequent falls recently, and was seen by Neurology 2 days ago outpatient. He has had his COVID-19 vaccine  ROS: fatigue, headaches, otherwise as in HPI  PMH HTN, Hep C, Depression, recent falls as in HPI (workup with neurology currently underway, possible parkinsons)  PSH No past eye surgeries. Prior left should surgery and left carpel tunnel release  There are no problems to display for this patient.   No current facility-administered medications on file prior to encounter.   Current Outpatient Medications on File Prior to Encounter  Medication Sig Dispense Refill  . aspirin 81 MG EC tablet Take 81 mg by mouth daily.    . carbidopa-levodopa (SINEMET IR) 25-100 MG tablet Take 1/2 tablet in AM and bedtime for one week, then 1/2 tablet three times daily for one week, then take 1 tablet three times daily. 90 tablet 0  . cetirizine (ZYRTEC) 10 MG tablet Take 10 mg by mouth daily.    . DULoxetine (CYMBALTA) 60 MG capsule Take 60 mg by mouth daily.    . hydrochlorothiazide (HYDRODIURIL) 25 MG tablet Take 25 mg by mouth daily.    Marland Kitchen lisinopril (ZESTRIL) 40 MG tablet Take 40 mg by mouth daily.    Marland Kitchen MYRBETRIQ 50 MG TB24 tablet Take 50 mg by mouth daily.    . propranolol (INDERAL) 40 MG tablet Take 40 mg by mouth 2 (two) times daily.    No Known Allergies   EXAMINATION  VAsc (near  with 2.5D add): OD: 20/25 OS: NLP  Pupils:  OD: Equal, round, reactive, no APD OS: pupil not visible due to open globe and complete hyphema  T(Pen): OD:   17 mm Hg OS: deferred  CVF: full to finger counting OD EOM: full OD  Anterior Segment Exam (patient lying in bed, exam with penlight/indirect/20D lens): Ext/Lids: R: normal  L: +lid edema, no lid laceration Conj/Sclera:  OD white and quiet  OS: hemorrhagic chemosis with nasal scleral rupture and extruded uveal tissue with surrounding blood clot  Cornea: OD clear, no abrasion or infiltrate OS: no visible corneal laceration  AC: OD: Deep and Quiet, OS: 100%/complete hyphema, flat Iris: OD: round and flat, OS: not visible Lens: OD 2+ NS OS: not visible  Dilated OD only with phenylephrine and tropicamide OD  Dilated Fundus Exam (OD only, OS dilation deferred due to open globe): Vitreous: Clear  Disc: sharp and pink with 0.3 c/d  Macula: flat and dry  Vessels: normal distribution , perfused  Periphery: flat and attached 360 without breaks or tears      Labs/Imaging: CLINICAL DATA:  Fall with left orbital trauma  EXAM: CT HEAD AND ORBITS WITHOUT CONTRAST  TECHNIQUE: Contiguous axial images were obtained from the base of the skull through the vertex without contrast. Multidetector CT imaging of the orbits was performed using the standard protocol without intravenous contrast.  COMPARISON:  None.  FINDINGS: CT HEAD FINDINGS  Brain: No evidence of acute infarction, hemorrhage, hydrocephalus, extra-axial collection or mass lesion/mass effect.  Vascular: No hyperdense vessel or unexpected calcification.  Skull: Normal. Negative for fracture or focal lesion.  Other: None.  CT ORBITS FINDINGS  Orbits: There is heterogeneous density material throughout all chambers of the left lobe. There is suspected globe disruption along the anterior aspect best seen series 10, image 63. The left optic nerve and  extraocular muscles are normal. The right orbit is normal.  Visualized sinuses: Clear.  Soft tissues: Mild left periorbital soft tissue swelling.  IMPRESSION: 1. No acute intracranial abnormality. 2. Suspected globe disruption along the anterior aspect of the left globe with intra-ocular hemorrhage. 3. Mild left periorbital soft tissue swelling.     Imp/Plan:  1. 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 - We discussed the visual prognosis for the eye is extremely guarded and given the presenting vision of no light perception, it is unlikely that any vision will be regained, though recommend operative repair with the goals of 1) saving the eye  2) preventing infection 3) restoring anatomy 4) possibly regaining some vision, though highly unlikely in this scernario - Patient agrees to proceed with surgical repair, consent obtained  2. Complete Hyphema OS - as above  3. Nuclear Sclerotic cataract OD - monitor, good vision    Lonia Skinner, M.D. Ophthalmology [facility]

## 2019-09-19 ENCOUNTER — Encounter (HOSPITAL_COMMUNITY): Payer: Self-pay | Admitting: Ophthalmology

## 2019-09-19 DIAGNOSIS — W19XXXA Unspecified fall, initial encounter: Secondary | ICD-10-CM

## 2019-09-19 DIAGNOSIS — Y92009 Unspecified place in unspecified non-institutional (private) residence as the place of occurrence of the external cause: Secondary | ICD-10-CM

## 2019-09-19 DIAGNOSIS — I10 Essential (primary) hypertension: Secondary | ICD-10-CM

## 2019-09-19 LAB — CBC
HCT: 34.6 % — ABNORMAL LOW (ref 39.0–52.0)
Hemoglobin: 12.7 g/dL — ABNORMAL LOW (ref 13.0–17.0)
MCH: 34.6 pg — ABNORMAL HIGH (ref 26.0–34.0)
MCHC: 36.7 g/dL — ABNORMAL HIGH (ref 30.0–36.0)
MCV: 94.3 fL (ref 80.0–100.0)
Platelets: 115 10*3/uL — ABNORMAL LOW (ref 150–400)
RBC: 3.67 MIL/uL — ABNORMAL LOW (ref 4.22–5.81)
RDW: 18.9 % — ABNORMAL HIGH (ref 11.5–15.5)
WBC: 6 10*3/uL (ref 4.0–10.5)
nRBC: 0 % (ref 0.0–0.2)

## 2019-09-19 LAB — BASIC METABOLIC PANEL
Anion gap: 13 (ref 5–15)
BUN: 8 mg/dL (ref 8–23)
CO2: 18 mmol/L — ABNORMAL LOW (ref 22–32)
Calcium: 8.1 mg/dL — ABNORMAL LOW (ref 8.9–10.3)
Chloride: 94 mmol/L — ABNORMAL LOW (ref 98–111)
Creatinine, Ser: 0.79 mg/dL (ref 0.61–1.24)
GFR calc Af Amer: >60 mL/min (ref >60)
GFR calc non Af Amer: >60 mL/min (ref >60)
Glucose, Bld: 133 mg/dL — ABNORMAL HIGH (ref 70–99)
Potassium: 4 mmol/L (ref 3.5–5.1)
Sodium: 125 mmol/L — ABNORMAL LOW (ref 135–145)

## 2019-09-19 LAB — SODIUM, URINE, RANDOM: Sodium, Ur: 108 mmol/L

## 2019-09-19 LAB — TROPONIN I (HIGH SENSITIVITY): Troponin I (High Sensitivity): 11 ng/L (ref <18)

## 2019-09-19 LAB — OSMOLALITY, URINE: Osmolality, Ur: 481 mOsm/kg (ref 300–900)

## 2019-09-19 LAB — OSMOLALITY: Osmolality: 275 mOsm/kg (ref 275–295)

## 2019-09-19 MED ORDER — SUGAMMADEX SODIUM 200 MG/2ML IV SOLN
INTRAVENOUS | Status: DC | PRN
  Administered 2019-09-19: 160 mg via INTRAVENOUS

## 2019-09-19 MED ORDER — SENNOSIDES-DOCUSATE SODIUM 8.6-50 MG PO TABS: 1.0000 | ORAL_TABLET | Freq: Every evening | ORAL | Status: DC | PRN

## 2019-09-19 MED ORDER — PREDNISOLONE ACETATE 1 % OP SUSP
1.0000 [drp] | Freq: Four times a day (QID) | OPHTHALMIC | Status: DC
  Administered 2019-09-19 – 2019-09-30 (×44): 1 [drp] via OPHTHALMIC
  Filled 2019-09-19: qty 5

## 2019-09-19 MED ORDER — PROPRANOLOL HCL 20 MG PO TABS
40.0000 mg | ORAL_TABLET | Freq: Two times a day (BID) | ORAL | Status: DC
  Administered 2019-09-19 – 2019-09-20 (×3): 40 mg via ORAL
  Filled 2019-09-19 (×4): qty 2

## 2019-09-19 MED ORDER — HYDRALAZINE HCL 20 MG/ML IJ SOLN
10.0000 mg | Freq: Once | INTRAMUSCULAR | Status: AC
  Administered 2019-09-19: 10 mg via INTRAVENOUS
  Filled 2019-09-19: qty 1

## 2019-09-19 MED ORDER — AMLODIPINE BESYLATE 10 MG PO TABS
10.0000 mg | ORAL_TABLET | Freq: Every day | ORAL | Status: DC
  Administered 2019-09-20: 10 mg via ORAL
  Filled 2019-09-19: qty 1

## 2019-09-19 MED ORDER — AMLODIPINE BESYLATE 5 MG PO TABS
5.0000 mg | ORAL_TABLET | Freq: Every day | ORAL | Status: DC
  Administered 2019-09-19: 5 mg via ORAL
  Filled 2019-09-19: qty 1

## 2019-09-19 MED ORDER — PROMETHAZINE HCL 25 MG PO TABS: 12.5000 mg | ORAL_TABLET | Freq: Four times a day (QID) | ORAL | Status: DC | PRN

## 2019-09-19 MED ORDER — HYDRALAZINE HCL 20 MG/ML IJ SOLN
10.0000 mg | Freq: Four times a day (QID) | INTRAMUSCULAR | Status: DC | PRN
  Administered 2019-09-19: 10 mg via INTRAVENOUS
  Filled 2019-09-19: qty 1

## 2019-09-19 MED ORDER — GATIFLOXACIN 0.5 % OP SOLN
1.0000 [drp] | Freq: Four times a day (QID) | OPHTHALMIC | Status: DC
  Administered 2019-09-19 – 2019-09-30 (×44): 1 [drp] via OPHTHALMIC
  Filled 2019-09-19: qty 2.5

## 2019-09-19 MED ORDER — HYDROMORPHONE HCL 1 MG/ML IJ SOLN
1.0000 mg | INTRAMUSCULAR | Status: DC | PRN
  Administered 2019-09-19 – 2019-09-30 (×15): 1 mg via INTRAVENOUS
  Filled 2019-09-19 (×15): qty 1

## 2019-09-19 MED ORDER — HYDROCODONE-ACETAMINOPHEN 5-325 MG PO TABS
1.0000 | ORAL_TABLET | Freq: Four times a day (QID) | ORAL | Status: DC | PRN
  Administered 2019-09-19: 1 via ORAL
  Administered 2019-09-20: 2 via ORAL
  Administered 2019-09-21: 1 via ORAL
  Filled 2019-09-19 (×2): qty 1
  Filled 2019-09-19: qty 2

## 2019-09-19 MED ORDER — SODIUM CHLORIDE 0.9 % IV SOLN: INTRAVENOUS | Status: DC

## 2019-09-19 MED ORDER — SODIUM CHLORIDE 0.9 % IV SOLN
1.0000 g | Freq: Three times a day (TID) | INTRAVENOUS | Status: DC
  Administered 2019-09-19 – 2019-09-20 (×5): 1 g via INTRAVENOUS
  Filled 2019-09-19 (×9): qty 1

## 2019-09-19 MED ORDER — AMLODIPINE BESYLATE 5 MG PO TABS
5.0000 mg | ORAL_TABLET | Freq: Once | ORAL | Status: AC
  Administered 2019-09-19: 5 mg via ORAL
  Filled 2019-09-19: qty 1

## 2019-09-19 MED ORDER — NEOMYCIN-POLYMYXIN-DEXAMETH 3.5-10000-0.1 OP OINT
1.0000 "application " | TOPICAL_OINTMENT | Freq: Four times a day (QID) | OPHTHALMIC | Status: DC
  Administered 2019-09-19 – 2019-09-30 (×44): 1 via OPHTHALMIC
  Filled 2019-09-19: qty 3.5

## 2019-09-19 MED ORDER — HYDROCHLOROTHIAZIDE 25 MG PO TABS: 25.0000 mg | ORAL_TABLET | Freq: Every day | ORAL | Status: DC

## 2019-09-19 MED ORDER — SODIUM CHLORIDE 0.9 % IV SOLN: INTRAVENOUS | Status: DC | PRN

## 2019-09-19 MED ORDER — LISINOPRIL 40 MG PO TABS
40.0000 mg | ORAL_TABLET | Freq: Every day | ORAL | Status: DC
  Administered 2019-09-19 – 2019-09-20 (×2): 40 mg via ORAL
  Filled 2019-09-19 (×2): qty 1

## 2019-09-19 NOTE — Progress Notes (Signed)
[facility]                                                                              Patient Demographics  Nicholas Ramirez, is a 74 y.o. male, DOB - 1945/05/30, QZE:[id number]  Admit date - 09/18/2019   Admitting Physician Eben Burow, MD  Outpatient Primary MD for the patient is Townsend Roger, MD  Outpatient specialists:   LOS - 1  days   Medical records reviewed and are as summarized below:    Chief Complaint  Patient presents with  . Fall       Brief summary   Patient is a 74 year old male with HTN, heart murmur, cataracts, hepatitis C presented to EMS after a fall at home.  Patient had multiple falls over the last few months at home.  He has been evaluated at outpatient neurology (Dr Tomi Likens) has had CT scan and MRI of his brain performed.  He is scheduled for an MRI of his C-spine in the near future the neurologist ordered.  He reported he has had generalized weakness with a slow unsteady gait.  His daughter has not seen him walking recently and is unsure if he has a shuffling gait.  He was recently started on levodopa carbidopa for possible Parkinson's disease by neurology.  Reports he was at home tonight when he tripped over something and fell and hit the left side of his head on his dresser as he fell.  He sustained a laceration to the side of his eye and hit his eye.  He was unable to see and EMS was called.  He reports that he had normal vision with his glasses on in both eyes until this fall.  He reports he cannot see out of the left eye after his fall.  He was found to have hyphema and possible hematoma in left eye on CT scan.  He has not had any chest pain palpitation, nausea, vomiting or diarrhea, shortness of breath, cough.  He denies any loss of consciousness with the fall  ED Course: CT scan of his orbits shows probable rupture of anterior left globe.  Ophthalmology has been consulted and evaluated patient and plans for surgical intervention  to try to save the eye.  Patient was given broad-spectrum antibiotics in the emergency room as well as a tetanus shot.    Assessment & Plan    Principal Problem:   Ruptured globe of left eye, open globe/letter rupture with uveal prolapse/loss of uvular tissue, complete hyphema -Status post mechanical fall and trauma to his eye -Patient was evaluated by ophthalmology, status post surgical repair -Ophthalmology following, appreciate Dr. Roda Shutters recommendations   Active Problems: Accelerated hypertension -BP currently elevated, added Norvasc 10 mg daily, continue lisinopril, propranolol -Hydralazine 10 mg IV x1   Mechanical fall, having recurrent falls at home, recent diagnosis of Parkinson's -PT OT evaluation    Hyponatremia -Serum osmolality 275, urine osmolality 41, urine sodium 108, SIADH component  -DC IV fluids.  Sodium 123 at the time of admission, hold off on HCTZ -Hold off on salt tablets due to accelerated hypertension    Prolonged QT interval Avoid  QT prolonging medications -Monitor K, magnesium   Obesity Estimated body mass index is 36.18 kg/m as calculated from the following:   Height as of this encounter: 5\' 9"  (1.753 m).   Weight as of this encounter: 111.1 kg.  Code Status full CODE STATUS DVT Prophylaxis: SCDs Family Communication: Discussed all imaging results, lab results, explained to the patient's daughter at the bedside   Disposition Plan:     Status is: Inpatient  Remains inpatient appropriate because:Inpatient level of care appropriate due to severity of illness   Dispo: The patient is from: Home              Anticipated d/c is to: TBD              Anticipated d/c date is: 2 days              Patient currently is not medically stable to d/c.      Time Spent in minutes   65mins    Procedures:    Consultants:   Ophthalmology Antimicrobials:   Anti-infectives (From admission, onward)   Start     Dose/Rate Route Frequency Ordered  Stop   09/19/19 0800  vancomycin (VANCOCIN) IVPB 1000 mg/200 mL premix        1,000 mg 200 mL/hr over 60 Minutes Intravenous Every 12 hours 09/18/19 1932     09/19/19 0300  cefTAZidime (FORTAZ) 1 g in sodium chloride 0.9 % 100 mL IVPB       Note to Pharmacy: Ruptured left eye globe after fall at home and hit head on furniture   1 g 200 mL/hr over 30 Minutes Intravenous Every 8 hours 09/19/19 0138     09/18/19 2304  polymyxin 500,000 units/ceftazidime 1 g ophthalmic irrigation  Status:  Discontinued          As needed 09/18/19 2304 09/19/19 0044   09/18/19 2000  vancomycin (VANCOCIN) 2,500 mg in sodium chloride 0.9 % 500 mL IVPB        2,500 mg 250 mL/hr over 120 Minutes Intravenous  Once 09/18/19 1903 09/18/19 2243   09/18/19 1845  cefTAZidime (FORTAZ) 2 g in sodium chloride 0.9 % 100 mL IVPB        2 g 200 mL/hr over 30 Minutes Intravenous  Once 09/18/19 1809 09/18/19 1916          Medications  Scheduled Meds: . amLODipine  5 mg Oral Daily  . lisinopril  40 mg Oral Daily  . propranolol  40 mg Oral BID   Continuous Infusions: . sodium chloride 75 mL/hr at 09/19/19 0226  . cefTAZidime (FORTAZ)  IV 1 g (09/19/19 0306)  . vancomycin 1,000 mg (09/19/19 1101)   PRN Meds:.hydrALAZINE, HYDROcodone-acetaminophen, HYDROmorphone (DILAUDID) injection, promethazine, senna-docusate      Subjective:   Nicholas Ramirez was seen and examined today.  Patient resting comfortably at the time of my examination, per daughter had eye surgery last night.  Arousable, no active pain, nausea vomiting or diarrhea.    Objective:   Vitals:   09/19/19 0100 09/19/19 0115 09/19/19 0159 09/19/19 0416  BP: (!) 158/84 (!) 188/118 (!) 180/110 (!) 146/100  Pulse: 81 99  (!) 102  Resp: 13 19    Temp:   98.2 F (36.8 C)   TempSrc:   Oral   SpO2: 94% 97% 97% 95%  Weight:      Height:        Intake/Output Summary (Last 24 hours) at 09/19/2019 1102 Last data filed  at 09/19/2019 0244 Gross per 24  hour  Intake 1000 ml  Output 720 ml  Net 280 ml     Wt Readings from Last 3 Encounters:  09/18/19 111.1 kg  09/16/19 111.1 kg     Exam  General: Sleepy but arousable  Cardiovascular: S1 S2 auscultated, no murmurs, RRR  Respiratory: CTA B  Gastrointestinal: Soft, nontender, nondistended, + bowel sounds  Ext: no pedal edema bilaterally  Neuro: withdraws extremities on exam, otherwise does not follow commands  Musculoskeletal: No digital cyanosis, clubbing  Skin: No rashes  Psych: Sleepy but arousable   Data Reviewed:  I have personally reviewed following labs and imaging studies  Micro Results Recent Results (from the past 240 hour(s))  SARS Coronavirus 2 by RT PCR (hospital order, performed in [place] hospital lab) Nasopharyngeal Nasopharyngeal Swab     Status: None   Collection Time: 09/18/19  6:00 PM   Specimen: Nasopharyngeal Swab  Result Value Ref Range Status   SARS Coronavirus 2 NEGATIVE NEGATIVE Final    Comment: (NOTE) SARS-CoV-2 target nucleic acids are NOT DETECTED.  The SARS-CoV-2 RNA is generally detectable in upper and lower respiratory specimens during the acute phase of infection. The lowest concentration of SARS-CoV-2 viral copies this assay can detect is 250 copies / mL. A negative result does not preclude SARS-CoV-2 infection and should not be used as the sole basis for treatment or other patient management decisions.  A negative result may occur with improper specimen collection / handling, submission of specimen other than nasopharyngeal swab, presence of viral mutation(s) within the areas targeted by this assay, and inadequate number of viral copies (<250 copies / mL). A negative result must be combined with clinical observations, patient history, and epidemiological information.  Fact Sheet for Patients:   StrictlyIdeas.no  Fact Sheet for Healthcare  Providers: BankingDealers.co.za  This test is not yet approved or  cleared by the [place] FDA and has been authorized for detection and/or diagnosis of SARS-CoV-2 by FDA under an Emergency Use Authorization (EUA).  This EUA will remain in effect (meaning this test can be used) for the duration of the COVID-19 declaration under Section 564(b)(1) of the Act, 21 U.S.C. section 360bbb-3(b)(1), unless the authorization is terminated or revoked sooner.  Performed at [facility], [place] [address] [place], [place] [address]     Radiology Reports CT Head Wo Contrast  Result Date: 09/18/2019 CLINICAL DATA:  Fall with left orbital trauma EXAM: CT HEAD AND ORBITS WITHOUT CONTRAST TECHNIQUE: Contiguous axial images were obtained from the base of the skull through the vertex without contrast. Multidetector CT imaging of the orbits was performed using the standard protocol without intravenous contrast. COMPARISON:  None. FINDINGS: CT HEAD FINDINGS Brain: No evidence of acute infarction, hemorrhage, hydrocephalus, extra-axial collection or mass lesion/mass effect. Vascular: No hyperdense vessel or unexpected calcification. Skull: Normal. Negative for fracture or focal lesion. Other: None. CT ORBITS FINDINGS Orbits: There is heterogeneous density material throughout all chambers of the left lobe. There is suspected globe disruption along the anterior aspect best seen series 10, image 63. The left optic nerve and extraocular muscles are normal. The right orbit is normal. Visualized sinuses: Clear. Soft tissues: Mild left periorbital soft tissue swelling. IMPRESSION: 1. No acute intracranial abnormality. 2. Suspected globe disruption along the anterior aspect of the left globe with intra-ocular hemorrhage. 3. Mild left periorbital soft tissue swelling. Electronically Signed   By: Ulyses Jarred M.D.   On: 09/18/2019 19:46  CT Orbits Wo Contrast  Result Date: 09/18/2019 CLINICAL  DATA:  Fall with left orbital trauma EXAM: CT HEAD AND ORBITS WITHOUT CONTRAST TECHNIQUE: Contiguous axial images were obtained from the base of the skull through the vertex without contrast. Multidetector CT imaging of the orbits was performed using the standard protocol without intravenous contrast. COMPARISON:  None. FINDINGS: CT HEAD FINDINGS Brain: No evidence of acute infarction, hemorrhage, hydrocephalus, extra-axial collection or mass lesion/mass effect. Vascular: No hyperdense vessel or unexpected calcification. Skull: Normal. Negative for fracture or focal lesion. Other: None. CT ORBITS FINDINGS Orbits: There is heterogeneous density material throughout all chambers of the left lobe. There is suspected globe disruption along the anterior aspect best seen series 10, image 63. The left optic nerve and extraocular muscles are normal. The right orbit is normal. Visualized sinuses: Clear. Soft tissues: Mild left periorbital soft tissue swelling. IMPRESSION: 1. No acute intracranial abnormality. 2. Suspected globe disruption along the anterior aspect of the left globe with intra-ocular hemorrhage. 3. Mild left periorbital soft tissue swelling. Electronically Signed   By: Ulyses Jarred M.D.   On: 09/18/2019 19:46    Lab Data:  CBC: Recent Labs  Lab 09/18/19 1811 09/19/19 0341  WBC 6.8 6.0  NEUTROABS 3.7  --   HGB 12.6* 12.7*  HCT 34.8* 34.6*  MCV 96.7 94.3  PLT 103* 379*   Basic Metabolic Panel: Recent Labs  Lab 09/18/19 1811 09/19/19 0341  NA 123* 125*  K 3.5 4.0  CL 91* 94*  CO2 22 18*  GLUCOSE 101* 133*  BUN 6* 8  CREATININE 0.80 0.79  CALCIUM 7.9* 8.1*   GFR: Estimated Creatinine Clearance: 102.6 mL/min (by C-G formula based on SCr of 0.79 mg/dL). Liver Function Tests: No results for input(s): AST, ALT, ALKPHOS, BILITOT, PROT, ALBUMIN in the last 168 hours. No results for input(s): LIPASE, AMYLASE in the last 168 hours. No results for input(s): AMMONIA in the last 168  hours. Coagulation Profile: Recent Labs  Lab 09/18/19 1811  INR 1.3*   Cardiac Enzymes: No results for input(s): CKTOTAL, CKMB, CKMBINDEX, TROPONINI in the last 168 hours. BNP (last 3 results) No results for input(s): PROBNP in the last 8760 hours. HbA1C: No results for input(s): HGBA1C in the last 72 hours. CBG: No results for input(s): GLUCAP in the last 168 hours. Lipid Profile: No results for input(s): CHOL, HDL, LDLCALC, TRIG, CHOLHDL, LDLDIRECT in the last 72 hours. Thyroid Function Tests: No results for input(s): TSH, T4TOTAL, FREET4, T3FREE, THYROIDAB in the last 72 hours. Anemia Panel: No results for input(s): VITAMINB12, FOLATE, FERRITIN, TIBC, IRON, RETICCTPCT in the last 72 hours. Urine analysis: No results found for: COLORURINE, APPEARANCEUR, LABSPEC, PHURINE, GLUCOSEU, HGBUR, BILIRUBINUR, KETONESUR, PROTEINUR, UROBILINOGEN, NITRITE, LEUKOCYTESUR   Nicholas Ramirez M.D. [facility] 09/19/2019, 11:02 AM   Call night coverage person covering after 7pm

## 2019-09-19 NOTE — Evaluation (Signed)
Physical Therapy Evaluation Patient Details Name: Nicholas Ramirez MRN: [id number] DOB: 1945/03/06 Today's Date: 09/19/2019   History of Present Illness  The pt is a 74 yo male presenting after a fall at home that resulted in injury to his L eye (rupture of anterior left globe). PMH includes: HTN, heart murmur, cataracts, hepatitis C, and ongoing workup for Parkinson's Disease with outpatient neurology.    Clinical Impression  Pt in bed upon arrival of PT, agreeable to evaluation at this time. Prior to admission the pt was ambulating without an AD in the home, but reports falling very frequently with mobility. He lives with his wife who assists with all home-making and ADLs for patient safety. The pt now presents with limitations in functional mobility, strength, balance, and activity tolerance due to above dx and ongoing balance difficulties, and will continue to benefit from skilled PT to address these deficits. The pt required mod/maxA to complete bed mobility and transfers due to strong posterior lean and inability to correct posture or positioning without significant cues and assist from the therapist. The pt had multiple LOB in sitting and needed constant cues in standing in addition to physical assist to maintain positioning. The pt and his family are in agreement about needing post-acute rehab to improve functional strength and stability to facilitate safety with transfers and reduced caregiver burden at home.      Follow Up Recommendations CIR    Equipment Recommendations   (defer to post acute)    Recommendations for Other Services OT consult;Rehab consult     Precautions / Restrictions Precautions Precautions: Fall Precaution Comments: pt admitted for a fall, daughter reports 15-20 in last 6 months Restrictions Weight Bearing Restrictions: No      Mobility  Bed Mobility Overal bed mobility: Needs Assistance Bed Mobility: Supine to Sit;Sit to Supine     Supine to sit: Mod  assist;HOB elevated Sit to supine: HOB elevated;Max assist;Mod assist   General bed mobility comments: modA to move BLE to EOB and back into bed, mod/maxA to transition trunk from [facility] [place]. modA to maintain balance sitting EOB but progressed to minA  Transfers Overall transfer level: Needs assistance Equipment used: 1 person hand held assist Transfers: Sit to/from Stand Sit to Stand: Max assist;From elevated surface         General transfer comment: maxA to stand due to strong posterior lean and lack of forward momentum/hip flexion. constant cues to to maintain upright as pt with strong posterior lean in stance  Ambulation/Gait Ambulation/Gait assistance: Max assist Gait Distance (Feet): 2 Feet Assistive device: 1 person hand held assist Gait Pattern/deviations: Step-to pattern;Shuffle     General Gait Details: pt with difficulty clearing BLE to take steps, benefits from verbal cues and facilitation of wt shift to complete lateral steps in both directions.     Balance Overall balance assessment: Needs assistance Sitting-balance support: Single extremity supported;Feet supported Sitting balance-Leahy Scale: Poor Sitting balance - Comments: modA progressed to minA to maintain   Standing balance support: Single extremity supported;During functional activity Standing balance-Leahy Scale: Poor Standing balance comment: maxA to maintain with static and dynamic stance due to strong posterior lean                             Pertinent Vitals/Pain Pain Assessment: Faces Faces Pain Scale: Hurts a little bit Pain Location: back soreness Pain Descriptors / Indicators: Grimacing;Sore Pain Intervention(s): Limited activity within patient's tolerance;Monitored during session  Home Living Family/patient expects to be discharged to:: Private residence Living Arrangements: Spouse/significant other Available Help at Discharge: Available 24 hours/day (grandaughters come to visit  and assist as needed) Type of Home: House Home Access: Level entry     Home Layout: One level Home Equipment: Hand held shower head;Shower seat Additional Comments: no AD or grab bars. pt unable to grasp with LUE    Prior Function Level of Independence: Needs assistance   Gait / Transfers Assistance Needed: supervision for gait with HHA, pt with multiple falls at home  ADL's / Homemaking Assistance Needed: wife completes all homemaking, assists with bathing, dressing, but pt able to feed himself  Comments: hig frequency of falls, unable to grip or use LUE functionally following carpal tunnel surgery a few years ago     Hand Dominance   Dominant Hand: Right    Extremity/Trunk Assessment   Upper Extremity Assessment Upper Extremity Assessment: LUE deficits/detail;Generalized weakness LUE Deficits / Details: limited ROM at shoulder, elbow, and wrist. fingers in flexed grasp. reports it has been like this since carpal tunnel surgery    Lower Extremity Assessment Lower Extremity Assessment: Generalized weakness    Cervical / Trunk Assessment Cervical / Trunk Assessment: Normal  Communication   Communication: No difficulties  Cognition Arousal/Alertness: Awake/alert Behavior During Therapy: WFL for tasks assessed/performed Overall Cognitive Status: Impaired/Different from baseline Area of Impairment: Safety/judgement;Problem solving                         Safety/Judgement: Decreased awareness of deficits;Decreased awareness of safety   Problem Solving: Slow processing;Decreased initiation;Requires verbal cues;Requires tactile cues General Comments: able to follow directions and cues, but poor insight to safety/positioning and needed significantly repeated cues to maintain      General Comments General comments (skin integrity, edema, etc.): daughter present and very supportive     Assessment/Plan    PT Assessment Patient needs continued PT services  PT  Problem List Decreased strength;Decreased mobility;Decreased safety awareness;Decreased activity tolerance;Decreased balance       PT Treatment Interventions DME instruction;Gait training;Functional mobility training;Therapeutic activities;Patient/family education;Balance training;Therapeutic exercise    PT Goals (Current goals can be found in the Care Plan section)  Acute Rehab PT Goals Patient Stated Goal: fall less PT Goal Formulation: With patient/family Time For Goal Achievement: 10/03/19 Potential to Achieve Goals: Good    Frequency Min 3X/week   Barriers to discharge Decreased caregiver support wife unable to provide heavy physical assist       AM-PAC PT "6 Clicks" Mobility  Outcome Measure Help needed turning from your back to your side while in a flat bed without using bedrails?: A Little Help needed moving from lying on your back to sitting on the side of a flat bed without using bedrails?: A Lot Help needed moving to and from a bed to a chair (including a wheelchair)?: A Lot Help needed standing up from a chair using your arms (e.g., wheelchair or bedside chair)?: A Lot Help needed to walk in hospital room?: Total Help needed climbing 3-5 steps with a railing? : Total 6 Click Score: 11    End of Session Equipment Utilized During Treatment: Gait belt Activity Tolerance: Patient tolerated treatment well Patient left: in bed;with call bell/phone within reach;with family/visitor present Nurse Communication: Mobility status PT Visit Diagnosis: Muscle weakness (generalized) (M62.81);History of falling (Z91.81);Repeated falls (R29.6);Difficulty in walking, not elsewhere classified (R26.2)    Time: 1497-0263 PT Time Calculation (min) (ACUTE ONLY): 43 min  Charges:   PT Evaluation $PT Eval Moderate Complexity: 1 Mod PT Treatments $Gait Training: 23-37 mins        Karma Ganja, PT, DPT   Acute Rehabilitation Department Pager #: [phone]  Otho Bellows 09/19/2019, 2:57 PM

## 2019-09-19 NOTE — Progress Notes (Signed)
Subjective: When asked if the patient has significant pain in his left eye, he states "No".     EXAMINATION  [facility] OS: NLP  Pupils:  OS: pupil not visible due to complete hyphema  T(Pen): OS: 5 mm Hg   Anterior Segment Exam (patient lying in bed, exam with penlight/indirect/20D lens): Ext/Lids: R: normal  L: +lid edema, no lid laceration Conj/Sclera:  OD white and quiet  OS: scleral sutures intact and well covered by conjunctiva. 360 subconj hemorrhage. No significant discharge  Cornea: OD clear  OS: no corneal laceration  AC: OD: Deep and Quiet, OS: 100%/complete hyphema, difficult to assess depth, though improved depth compared to preop Iris: OD: round and flat, OS: not visible Lens: OD 2+ NS OS: not visible   Imp/Plan:  1. 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 - POD#1 s/p Open globe repair with resection of prolapsed uveal tissue - complete water-tight closure not able to be achieved due to tissue loss and edema - No light perception vision on presentation and on exam today - eyeshield at all times - We discussed the visual prognosis for the eye is extremely guarded as before - Patient is on broad spectrum IV Vanc and Ceftazidime, recommend continuing and transitioning to PO broad spectrum on discharge - Eye drops as below      - gatifloxacin QID OS      - Pred acetate 1% QID OS      - Neo/Poly/Dex ointment QID OS - Please space drops and ointment by at least 5 minutes, placing the ointment last, and then resecure/retape the eyeshield   Dispo: Recommend f/u with Ophthalmology outpatient on Monday   Lonia Skinner [facility]

## 2019-09-19 NOTE — Transfer of Care (Signed)
Immediate Anesthesia Transfer of Care Note  Patient: Nicholas Ramirez  Procedure(s) Performed: REPAIR OF RUPTURED GLOBE (Left Eye)  Patient Location: PACU  Anesthesia Type:General  Level of Consciousness: awake  Airway & Oxygen Therapy: Patient Spontanous Breathing and Patient connected to nasal cannula oxygen  Post-op Assessment: Report given to RN and Post -op Vital signs reviewed and stable  Post vital signs: Reviewed and stable  Last Vitals:  Vitals Value Taken Time  BP    Temp    Pulse 82 09/19/19 0048  Resp 12 09/19/19 0048  SpO2 97 % 09/19/19 0048  Vitals shown include unvalidated device data.  Last Pain:  Vitals:   09/18/19 1857  TempSrc:   PainSc: 8          Complications: No complications documented.

## 2019-09-19 NOTE — Plan of Care (Signed)
  Problem: Clinical Measurements: Goal: Will remain free from infection Outcome: Progressing   Problem: Coping: Goal: Level of anxiety will decrease Outcome: Progressing   Problem: Safety: Goal: Ability to remain free from injury will improve Outcome: Progressing   

## 2019-09-19 NOTE — Progress Notes (Signed)
New Admission Note:   Arrival Method: Stretcher Mental Orientation: alert X 3 Telemetry None   Assessment: Completed Skin: see flowsheet IV: NSL Pain: none Tubes: none Safety Measures: Safety Fall Prevention Plan has been discussed Admission: Completed [facility] Orientation: Patient has been orientated to the room, unit and staff.  Family: none at bedside  Orders have been reviewed and implemented. Will continue to monitor the patient. Call light has been placed within reach and bed alarm has been activated.   Rockie Neighbours BSN, RN Phone number: [phone]

## 2019-09-19 NOTE — Progress Notes (Signed)
Patient arrived to unit from PACU left eye with gauge and eye shield, bruise noted on mid back and bilateral lower leg +4 pitting edema. IV fluids started 75 ml/hr. Patient appears to have short term memory deficit. Arthor Captain LPN

## 2019-09-20 ENCOUNTER — Inpatient Hospital Stay (HOSPITAL_COMMUNITY): Payer: Medicare Other

## 2019-09-20 ENCOUNTER — Telehealth: Payer: Self-pay | Admitting: Neurology

## 2019-09-20 DIAGNOSIS — N179 Acute kidney failure, unspecified: Secondary | ICD-10-CM

## 2019-09-20 LAB — BASIC METABOLIC PANEL
Anion gap: 11 (ref 5–15)
Anion gap: 11 (ref 5–15)
BUN: 25 mg/dL — ABNORMAL HIGH (ref 8–23)
BUN: 34 mg/dL — ABNORMAL HIGH (ref 8–23)
CO2: 21 mmol/L — ABNORMAL LOW (ref 22–32)
CO2: 20 mmol/L — ABNORMAL LOW (ref 22–32)
Calcium: 8.2 mg/dL — ABNORMAL LOW (ref 8.9–10.3)
Calcium: 8 mg/dL — ABNORMAL LOW (ref 8.9–10.3)
Chloride: 95 mmol/L — ABNORMAL LOW (ref 98–111)
Chloride: 96 mmol/L — ABNORMAL LOW (ref 98–111)
Creatinine, Ser: 2.71 mg/dL — ABNORMAL HIGH (ref 0.61–1.24)
Creatinine, Ser: 3.54 mg/dL — ABNORMAL HIGH (ref 0.61–1.24)
GFR calc Af Amer: 26 mL/min — ABNORMAL LOW (ref >60)
GFR calc Af Amer: 19 mL/min — ABNORMAL LOW (ref >60)
GFR calc non Af Amer: 22 mL/min — ABNORMAL LOW (ref >60)
GFR calc non Af Amer: 16 mL/min — ABNORMAL LOW (ref >60)
Glucose, Bld: 107 mg/dL — ABNORMAL HIGH (ref 70–99)
Glucose, Bld: 137 mg/dL — ABNORMAL HIGH (ref 70–99)
Potassium: 4.6 mmol/L (ref 3.5–5.1)
Potassium: 4.2 mmol/L (ref 3.5–5.1)
Sodium: 127 mmol/L — ABNORMAL LOW (ref 135–145)
Sodium: 127 mmol/L — ABNORMAL LOW (ref 135–145)

## 2019-09-20 LAB — VANCOMYCIN, TROUGH: Vancomycin Tr: 35 ug/mL (ref 15–20)

## 2019-09-20 IMAGING — MR MR CERVICAL SPINE W/O CM
5 series · 36 of 48 positions shown · non-contrast
Comparison: Neck MRA [DATE].  CT head and orbits [DATE].

CLINICAL DATA: 73-year-old male with multiple falls. Left upper
extremity weakness, muscle spasm. Myelopathy.

EXAM:
MRI CERVICAL SPINE WITHOUT CONTRAST
TECHNIQUE: Multiplanar, multisequence MR imaging of the cervical spine was
performed. No intravenous contrast was administered.

[Series 5: T1 · sagittal · 3.0mm · 0.69mm/px · 6 of 15 slices shown]
[im 1/15]
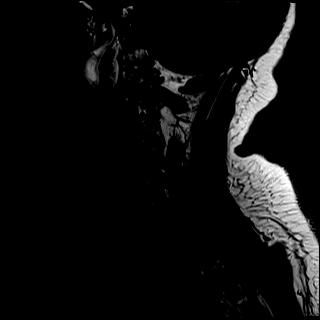
[im 3/15]
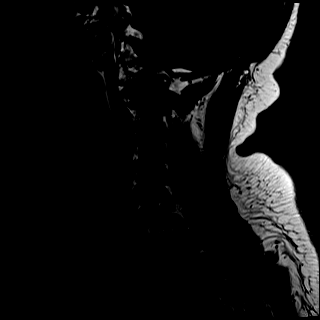
[im 6/15]
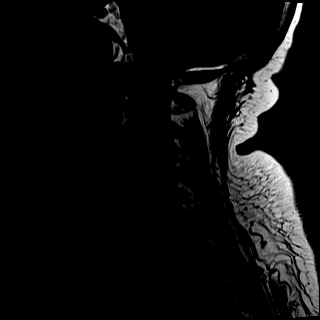
[im 9/15]
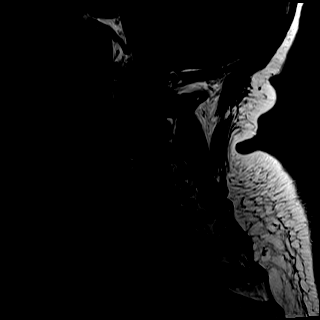
[im 12/15]
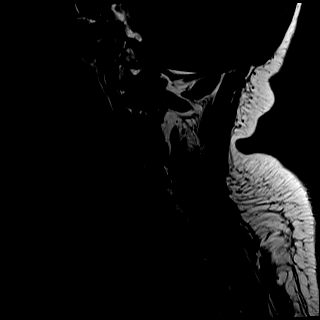
[im 15/15]
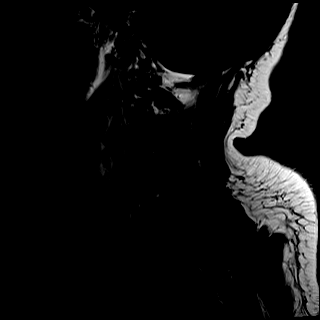

[Series 6: T2 · sagittal · 3.0mm · 0.69mm/px · 7 of 15 slices shown (1 of 2)]
[im 1/15]
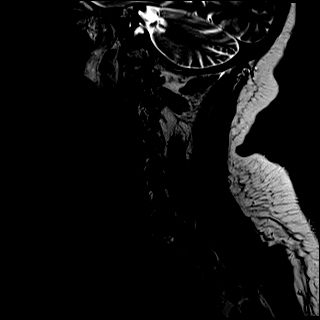
[im 3/15]
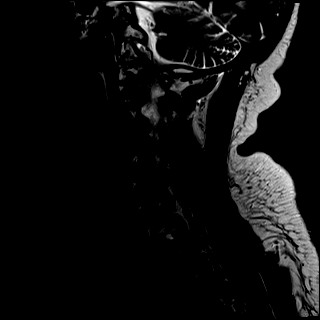
[im 5/15]
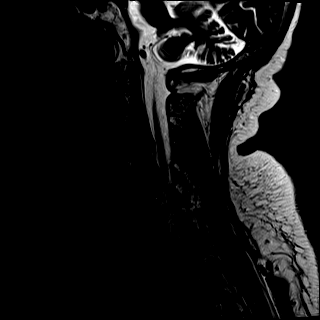
[im 8/15]
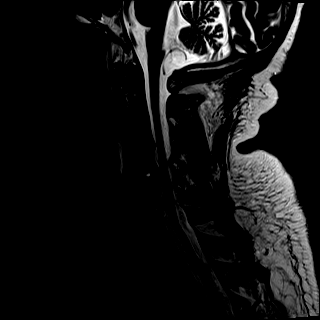
[im 10/15]
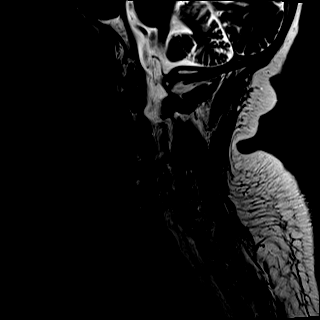
[im 12/15]
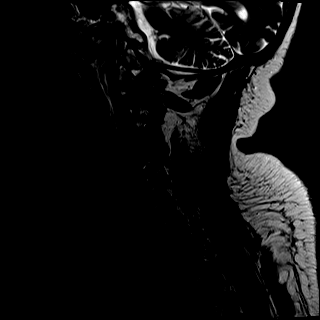
[im 15/15]
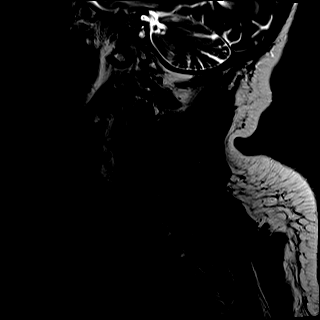

[Series 7: STIR · sagittal · 3.0mm · 0.86mm/px · 7 of 15 slices shown]
[im 1/15]
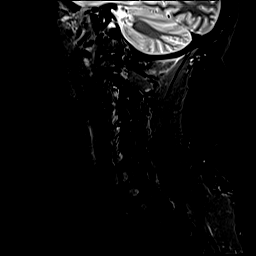
[im 3/15]
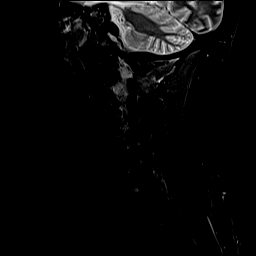
[im 5/15]
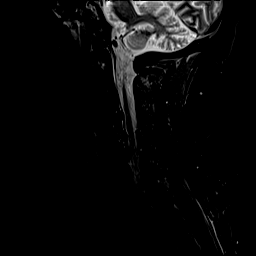
[im 8/15]
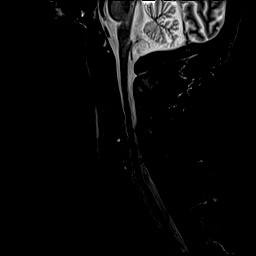
[im 10/15]
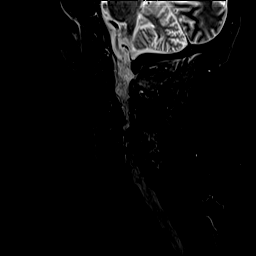
[im 12/15]
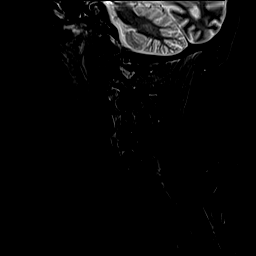
[im 15/15]
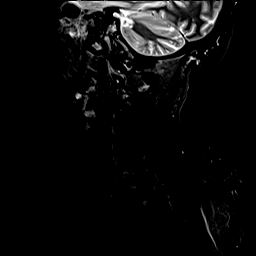

[Series 8: T2 · axial · 3.0mm · 0.66mm/px · z∈[-133,-33]mm · 8 of 32 slices shown (2 of 2)]
[im 1/32]
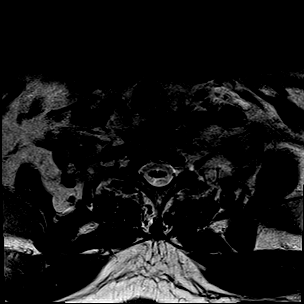
[im 5/32]
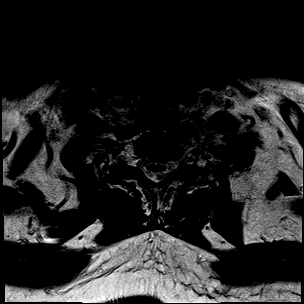
[im 10/32]
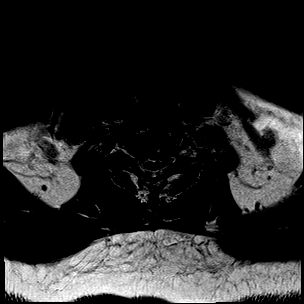
[im 15/32]
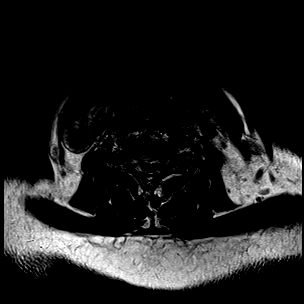
[im 17/32]
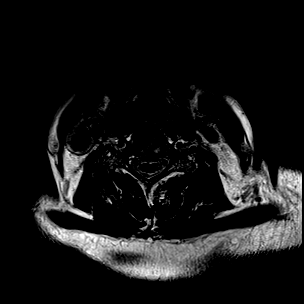
[im 22/32]
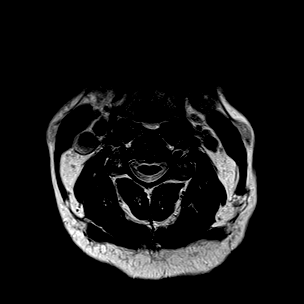
[im 27/32]
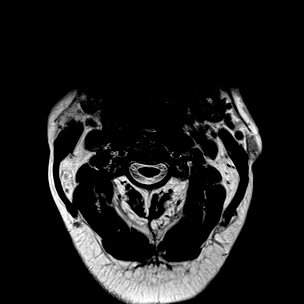
[im 32/32]
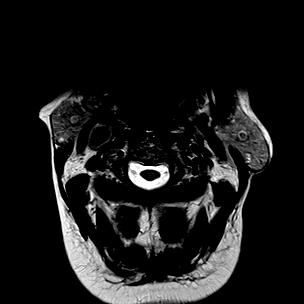

[Series 9: GRE · axial · 3.0mm · 0.39mm/px · z∈[-133,-33]mm · 8 of 32 slices shown]
[im 1/32]
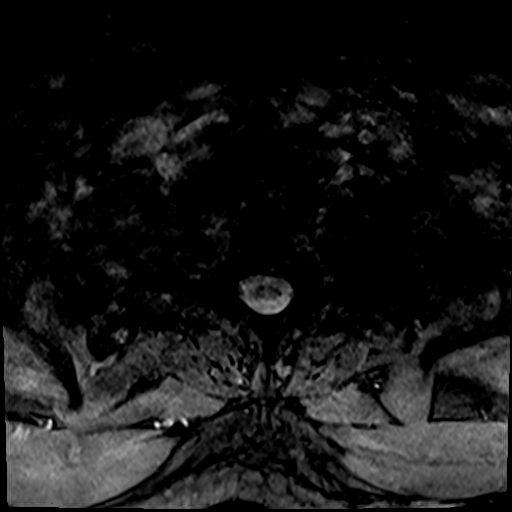
[im 5/32]
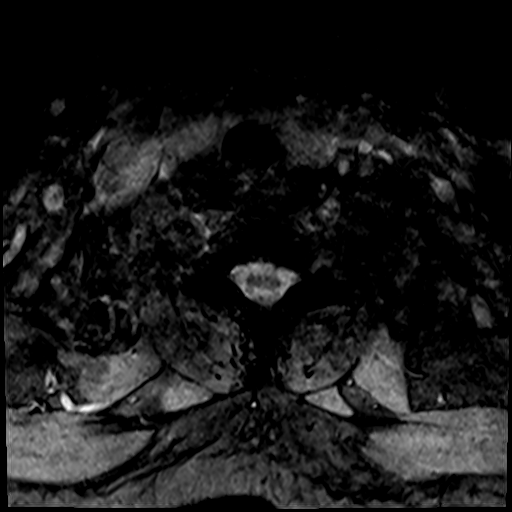
[im 10/32]
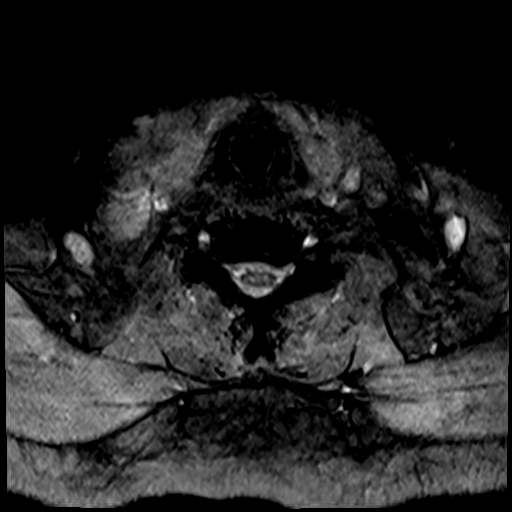
[im 15/32]
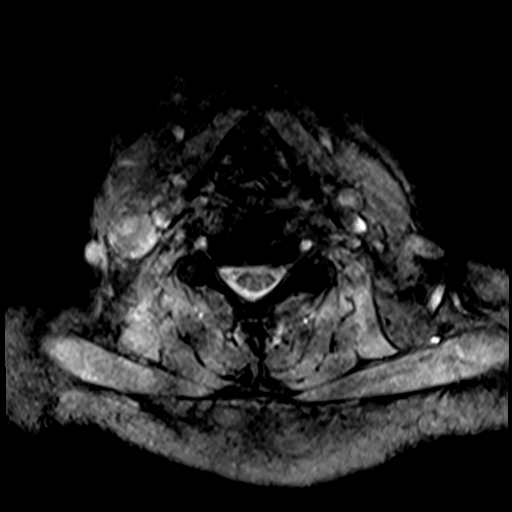
[im 17/32]
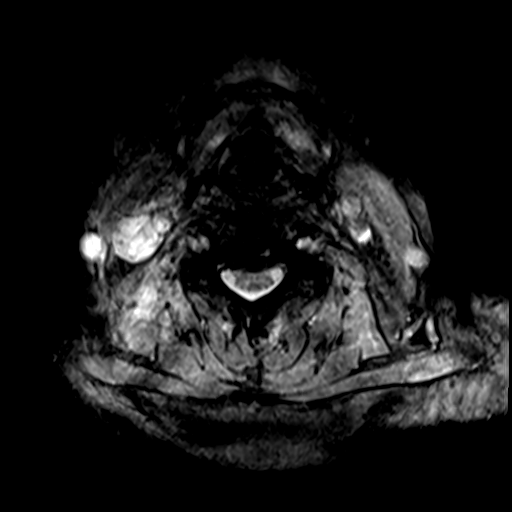
[im 22/32]
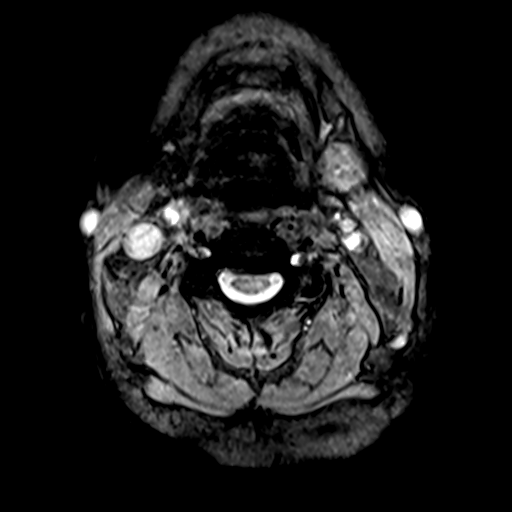
[im 27/32]
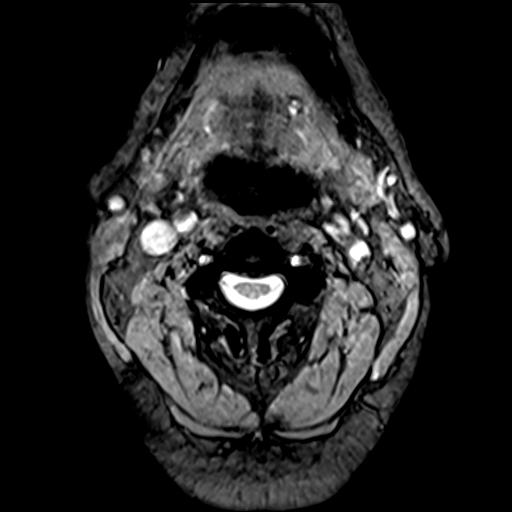
[im 32/32]
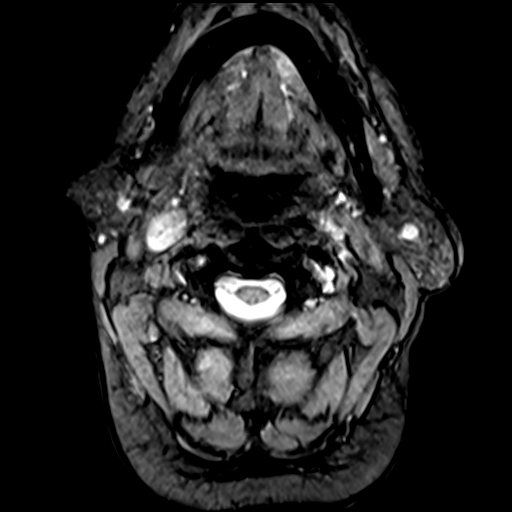

[36 of 48 positions shown; findings below may reference images not displayed]

FINDINGS: Alignment: Mild straightening of cervical lordosis. Mild
degenerative appearing retrolisthesis at from C4 on C5-C6 on C7.

Vertebrae: No marrow edema or evidence of acute osseous abnormality.
Degenerative endplate marrow signal changes.

Cord: Capacious spinal canal at most levels. Cervical and visible
upper thoracic spinal cord remain within normal limits.

Posterior Fossa, vertebral arteries, paraspinal tissues:
Cervicomedullary junction is within normal limits. Negative visible
posterior fossa. Preserved major vascular flow voids in the neck.
Negative visible neck soft tissues.

Disc levels:

C2-C3:  Negative.

C3-C4: Disc space loss. Mostly foraminal disc bulge and endplate
spurring. No spinal stenosis. Moderate bilateral C4 foraminal
stenosis.

C4-C5: Mild retrolisthesis and disc space loss. Circumferential disc
osteophyte complex and mild posterior element hypertrophy. No
significant spinal stenosis. Moderate to severe left and moderate
right C5 foraminal stenosis.

C5-C6: Mild retrolisthesis with disc space loss and circumferential
disc osteophyte complex. Mild posterior element hypertrophy. No
significant spinal stenosis. Moderate to severe bilateral C6
foraminal stenosis.

C6-C7: Mild retrolisthesis. Disc space loss. Mild circumferential
disc osteophyte complex. Mild posterior element hypertrophy. No
spinal stenosis. Mild to moderate C7 foraminal stenosis greater on
the right.

C7-T1: Negative disc. Mild to moderate facet hypertrophy. No
stenosis.

No visible upper thoracic spinal stenosis. There is bilateral upper
thoracic facet hypertrophy.
IMPRESSION: Chronic cervical spine degeneration with multilevel mild
spondylolisthesis. But capacious spinal canal with no significant
spinal stenosis. Normal cervical spinal cord.
Moderate or severe neural foraminal stenosis at the bilateral C4,
C5, and C6 nerve levels.

## 2019-09-20 IMAGING — US US RENAL
1 series · 14 of 25 positions shown · non-contrast
Comparison: None.

CLINICAL DATA: Acute renal injury.

EXAM:
RENAL / URINARY TRACT ULTRASOUND COMPLETE

[Series 1: us renal · 14 of 34 slices shown]
[im 1/34]
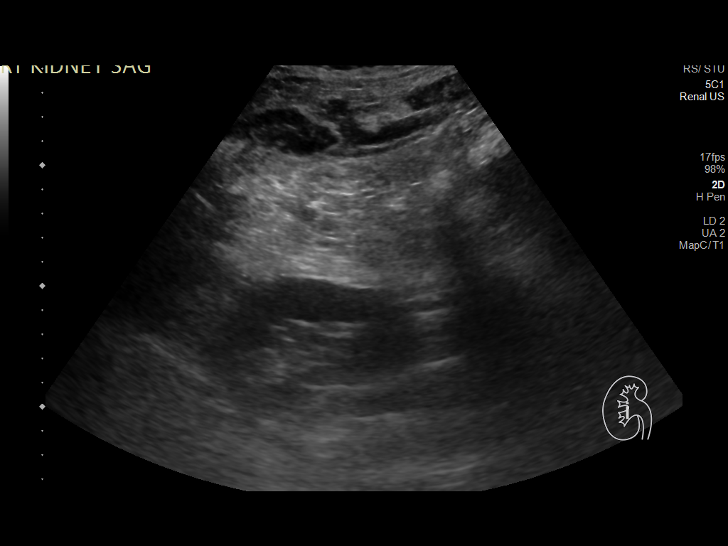
[im 3/34]
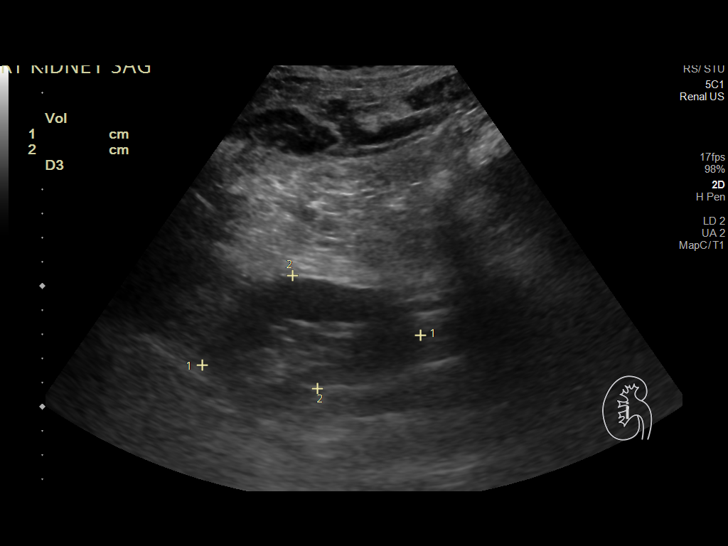
[im 6/34]
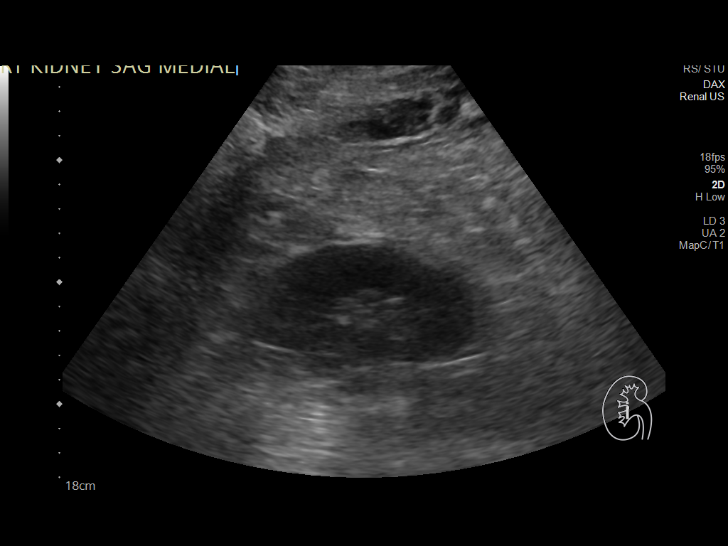
[im 9/34]
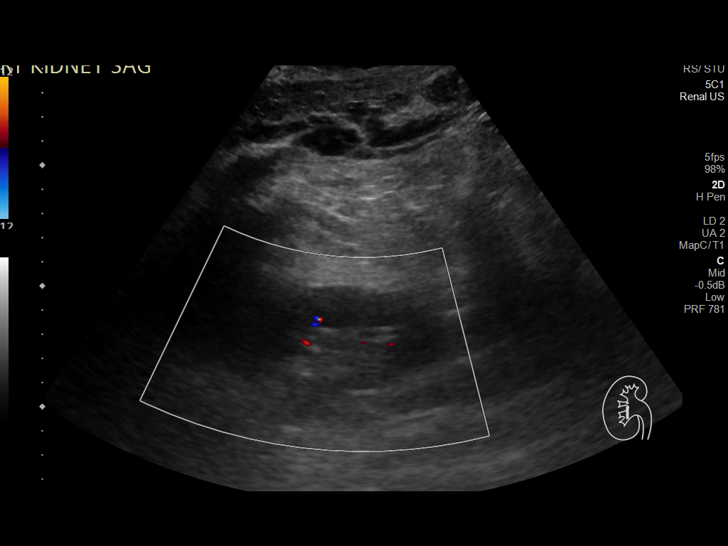
[im 12/34]
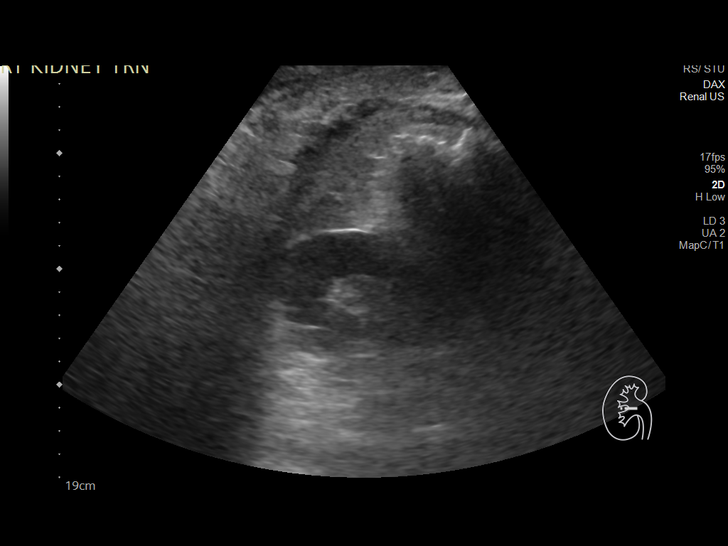
[im 13/34]
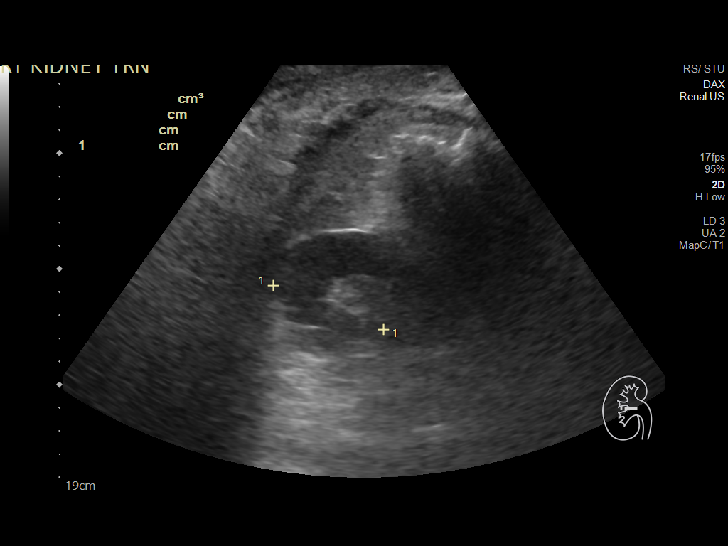
[im 16/34]
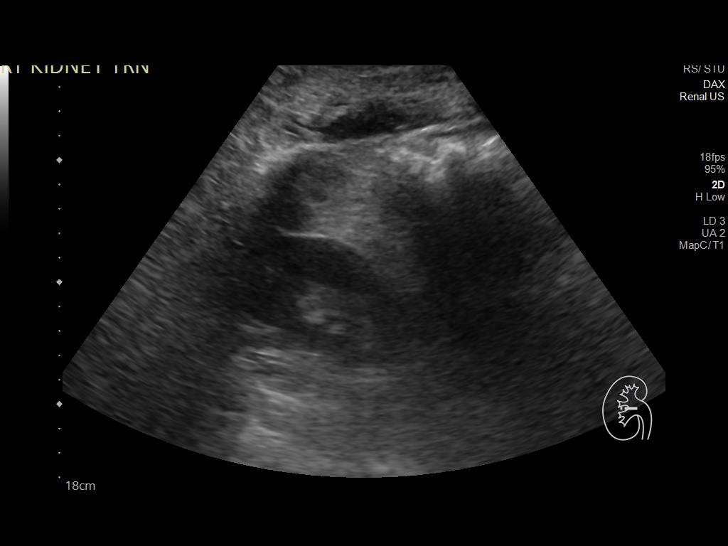
[im 18/34]
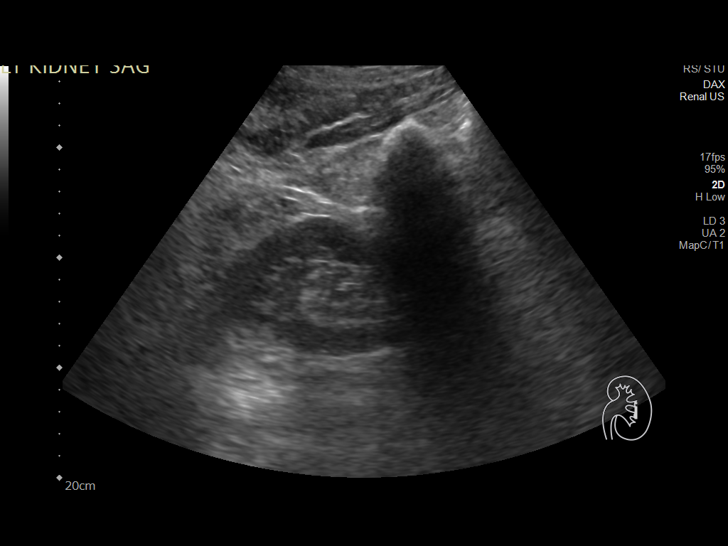
[im 21/34]
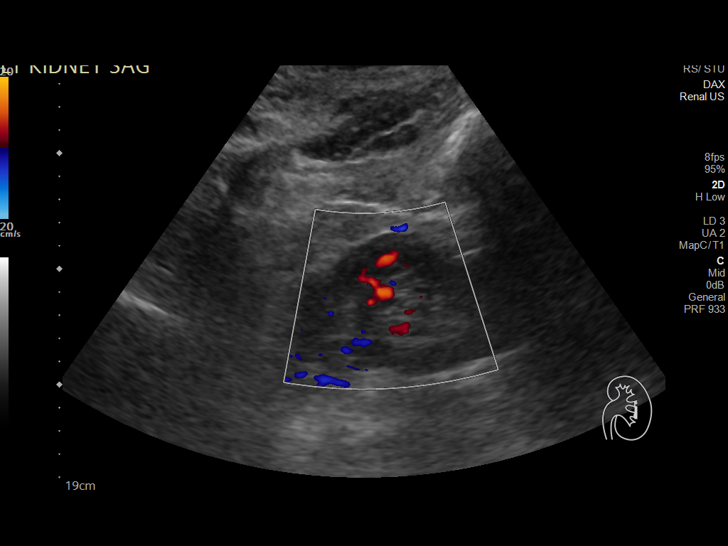
[im 23/34]
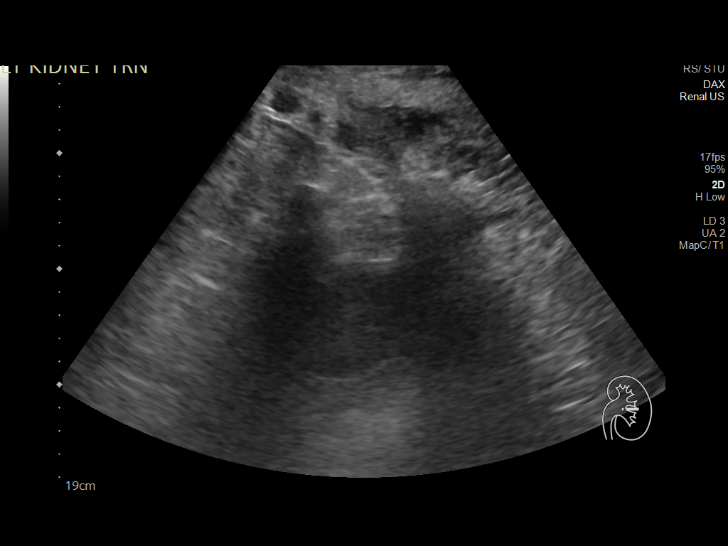
[im 25/34]
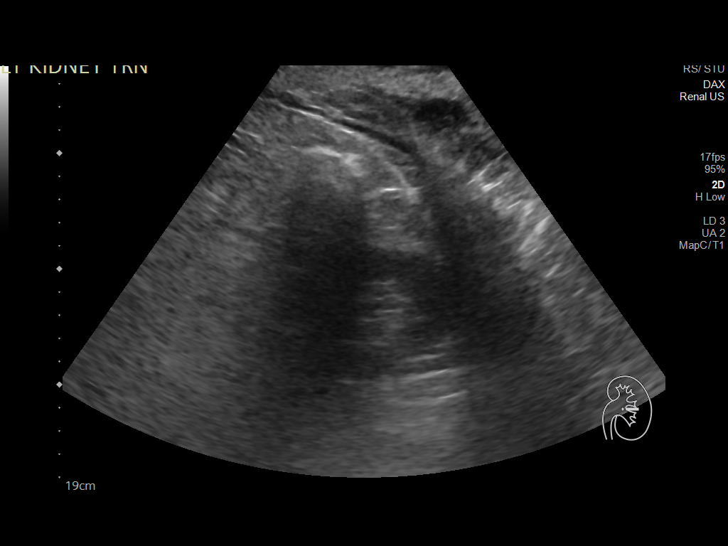
[im 28/34]
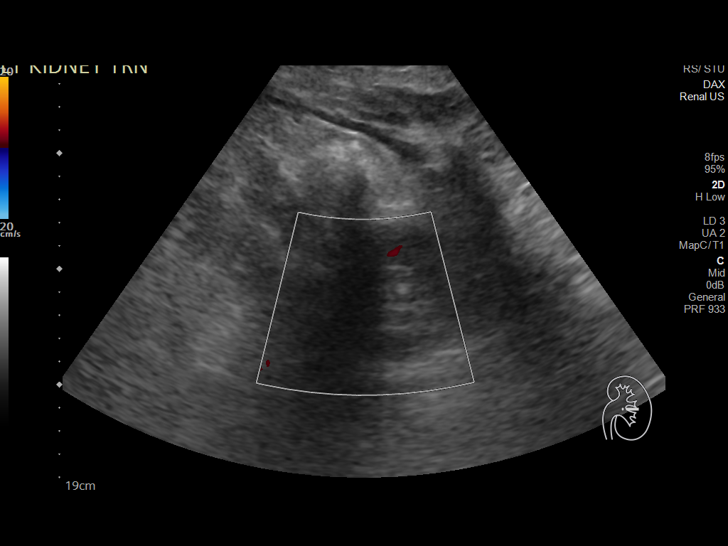
[im 31/34]
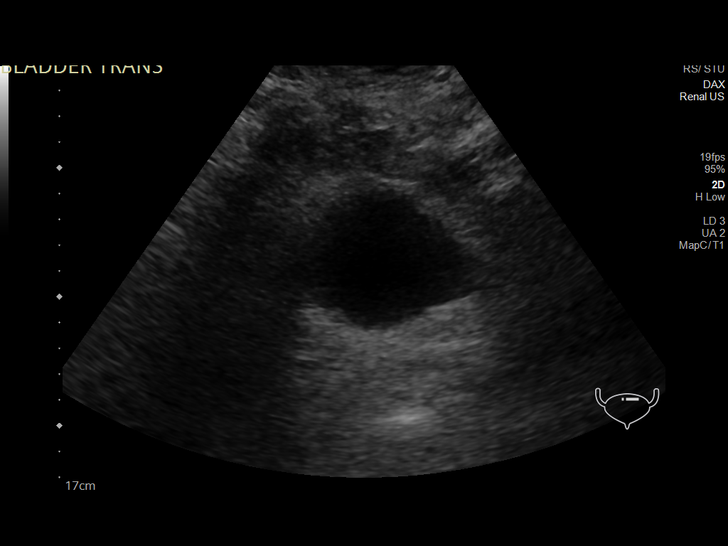
[im 34/34]
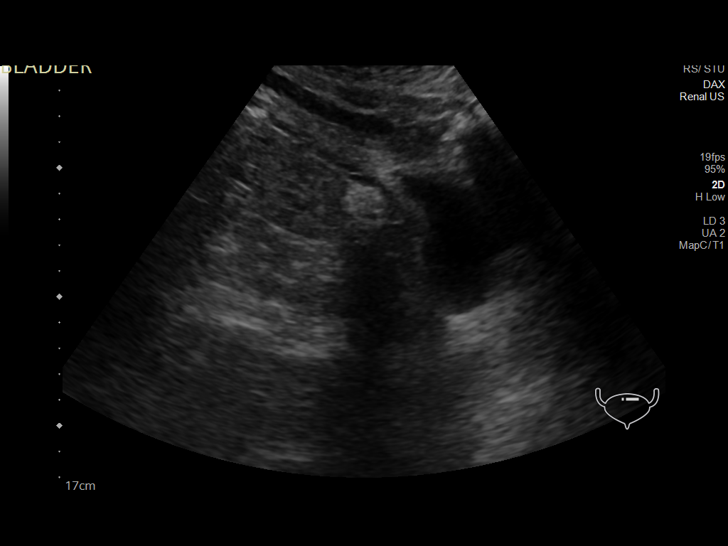

[14 of 25 positions shown; findings below may reference images not displayed]

FINDINGS: Right Kidney:

Renal measurements: 10.4 cm x 4.4 cm x 5.1 cm = volume: 122.1 mL.
Echogenicity within normal limits. No mass or hydronephrosis
visualized.

Left Kidney:

Renal measurements: 11.4 cm x 6.6 cm x 5.8 cm = volume: 225.9 mL.
Echogenicity within normal limits. No mass or hydronephrosis
visualized.

Bladder:

Appears normal for degree of bladder distention.

Other:

None.
IMPRESSION: Normal renal ultrasound.

## 2019-09-20 MED ORDER — SODIUM CHLORIDE 0.9 % IV SOLN
1.0000 g | Freq: Two times a day (BID) | INTRAVENOUS | Status: DC
  Administered 2019-09-20: 1 g via INTRAVENOUS
  Filled 2019-09-20 (×3): qty 1

## 2019-09-20 MED ORDER — DULOXETINE HCL 60 MG PO CPEP
60.0000 mg | ORAL_CAPSULE | Freq: Every day | ORAL | Status: DC
  Administered 2019-09-20 – 2019-09-30 (×11): 60 mg via ORAL
  Filled 2019-09-20 (×11): qty 1

## 2019-09-20 MED ORDER — SODIUM CHLORIDE 0.9 % IV SOLN: INTRAVENOUS | Status: AC

## 2019-09-20 MED ORDER — FINASTERIDE 5 MG PO TABS
5.0000 mg | ORAL_TABLET | Freq: Every day | ORAL | Status: DC
  Administered 2019-09-20 – 2019-09-30 (×11): 5 mg via ORAL
  Filled 2019-09-20 (×11): qty 1

## 2019-09-20 MED ORDER — CARBIDOPA-LEVODOPA 25-100 MG PO TABS
0.5000 | ORAL_TABLET | Freq: Two times a day (BID) | ORAL | Status: AC
  Administered 2019-09-20 – 2019-09-26 (×14): 0.5 via ORAL
  Filled 2019-09-20 (×14): qty 1

## 2019-09-20 MED ORDER — VANCOMYCIN VARIABLE DOSE PER UNSTABLE RENAL FUNCTION (PHARMACIST DOSING): Status: DC

## 2019-09-20 MED ORDER — LORATADINE 10 MG PO TABS
10.0000 mg | ORAL_TABLET | Freq: Every day | ORAL | Status: DC
  Administered 2019-09-20 – 2019-09-30 (×11): 10 mg via ORAL
  Filled 2019-09-20 (×11): qty 1

## 2019-09-20 MED ORDER — MIRABEGRON ER 50 MG PO TB24
50.0000 mg | ORAL_TABLET | Freq: Every day | ORAL | Status: DC
  Administered 2019-09-20 – 2019-09-30 (×11): 50 mg via ORAL
  Filled 2019-09-20 (×11): qty 1

## 2019-09-20 MED ORDER — TAMSULOSIN HCL 0.4 MG PO CAPS
0.4000 mg | ORAL_CAPSULE | Freq: Every day | ORAL | Status: DC
  Filled 2019-09-20: qty 1

## 2019-09-20 NOTE — Evaluation (Signed)
Occupational Therapy Evaluation Patient Details Name: Nicholas Ramirez MRN: [id number] DOB: 12-Oct-1945 Today's Date: 09/20/2019    History of Present Illness The pt is a 74 yo male presenting after a fall at home that resulted in injury to his L eye (rupture of anterior left globe). PMH includes: HTN, heart murmur, cataracts, hepatitis C, and ongoing workup for Parkinson's Disease with outpatient neurology.   Clinical Impression   Patient admitted for above and limited by problem list below, including impaired balance, impaired cognition, impaired vision, L sided tone and decreased functional use. PTA patient requires supervision for HHA mobility and assist for most ADLs but able to feed and groom self.  Patient currently min-total assist for ADLs, max assist +2 for bed mobility and mod assist +2 for transfers.  He is oriented, follows simple commands but demonstrates difficulty processing multiple step commands, sequencing and attending to tasks. He has limited functional use of L UE (family reports since carpal tunnel surgery) but will need to be monitored for splint. He has great support at home from family and believe he will best benefit from CIR level rehab to optimize independence/ decrease burden of care upon discharge home.  Will follow.     Follow Up Recommendations  Supervision/Assistance - 24 hour;CIR    Equipment Recommendations  Other (comment) (TBD at next venue of care )    Recommendations for Other Services       Precautions / Restrictions Precautions Precautions: Fall Precaution Comments: pt admitted for a fall, daughter reports 15-20 in last 6 months Restrictions Weight Bearing Restrictions: No      Mobility Bed Mobility Overal bed mobility: Needs Assistance Bed Mobility: Supine to Sit;Sit to Supine     Supine to sit: HOB elevated;Max assist Sit to supine: Max assist;+2 for physical assistance;+2 for safety/equipment   General bed mobility comments: max assist  for trunk and LB support to EOB, requires greatly increased time and multimodal cueing with poor sequencing and balance   Transfers Overall transfer level: Needs assistance Equipment used: 2 person hand held assist Transfers: Sit to/from Stand Sit to Stand: Mod assist;+2 physical assistance;+2 safety/equipment         General transfer comment: mod assist +2 to power up and steady using momentum/rocking to encouarge foward lean; mod-max assist +2 to maintain static standing due to heavy posterior lean    Balance Overall balance assessment: Needs assistance Sitting-balance support: No upper extremity supported;Feet supported;Single extremity supported Sitting balance-Leahy Scale: Poor Sitting balance - Comments: fluctates due to posterior lean, min-mod assist but preference to at least 1 UE support   Standing balance support: Bilateral upper extremity supported;During functional activity Standing balance-Leahy Scale: Poor Standing balance comment: relies on external support                           ADL either performed or assessed with clinical judgement   ADL Overall ADL's : Needs assistance/impaired     Grooming: Maximal assistance;Sitting   Upper Body Bathing: Maximal assistance;Sitting   Lower Body Bathing: Total assistance;+2 for physical assistance;+2 for safety/equipment;Sit to/from stand   Upper Body Dressing : Maximal assistance;Sitting   Lower Body Dressing: Total assistance;+2 for physical assistance;+2 for safety/equipment;Sit to/from stand               Functional mobility during ADLs: Moderate assistance;Maximal assistance;+2 for physical assistance;+2 for safety/equipment;Cueing for safety;Cueing for sequencing General ADL Comments: pt limited by decreased functional use of L UE, impaired  balance with heavy posterior lean, decreased activity tolerance, and impaired cognition      Vision   Additional Comments: pt initally reports can see out  of L eye "normally", but when tested individually patient unable to locate correct # of fingers with L eye (typically saying 2-3 more than up), depth perception appears WFL; L eye with clear hard plastic protector --red and swollen with drainage. Continue assessment      Perception     Praxis      Pertinent Vitals/Pain Pain Assessment: Faces Faces Pain Scale: Hurts a little bit Pain Location: back soreness Pain Descriptors / Indicators: Grimacing;Sore Pain Intervention(s): Limited activity within patient's tolerance;Monitored during session;Repositioned     Hand Dominance Right   Extremity/Trunk Assessment Upper Extremity Assessment Upper Extremity Assessment: LUE deficits/detail;Generalized weakness LUE Deficits / Details: hx of shoudler surgery 50 yrs ago and carpal tunnel surgery 2 years ago; presenting with increased tone and limited AROM/PROM at shoulder, eblow, wrist and hand (family reports pt not functionally using since carpal tunnel surgery) LUE Coordination: decreased fine motor;decreased gross motor   Lower Extremity Assessment Lower Extremity Assessment: Defer to PT evaluation       Communication Communication Communication: No difficulties   Cognition Arousal/Alertness: Awake/alert Behavior During Therapy: WFL for tasks assessed/performed Overall Cognitive Status: Impaired/Different from baseline Area of Impairment: Safety/judgement;Problem solving;Attention;Following commands;Awareness                   Current Attention Level: Sustained   Following Commands: Follows one step commands consistently;Follows one step commands with increased time;Follows multi-step commands inconsistently Safety/Judgement: Decreased awareness of safety;Decreased awareness of deficits Awareness: Emergent Problem Solving: Slow processing;Decreased initiation;Difficulty sequencing;Requires tactile cues;Requires verbal cues General Comments: patient following commands but  requires increased time and multimodal cueing for multistep commands during functional tasks, fair insight to deficts but requires constant redirection to tasks and repitition   General Comments  daughter present and supportive, L UE tone and will benefit from palm guard    Exercises     Shoulder Instructions      Home Living Family/patient expects to be discharged to:: Private residence Living Arrangements: Spouse/significant other Available Help at Discharge: Available 24 hours/day (granddaughers come to visit and assist as needed) Type of Home: House Home Access: Level entry     Home Layout: One level     Bathroom Shower/Tub: Teacher, early years/pre: Standard     Home Equipment: Hand held shower head;Shower seat   Additional Comments: no AD or grab bars. pt unable to grasp with LUE      Prior Functioning/Environment Level of Independence: Needs assistance  Gait / Transfers Assistance Needed: supervision for gait with HHA, pt with multiple falls at home ADL's / Homemaking Assistance Needed: spouse assist with bathing, dressing; pt able to feed and groom   Comments: high frequency of falls, unable to grip or use LUE functionally following carpal tunnel surgery a few years ago        OT Problem List: Decreased strength;Decreased activity tolerance;Impaired balance (sitting and/or standing);Decreased cognition;Decreased knowledge of use of DME or AE;Decreased safety awareness;Decreased knowledge of precautions;Pain;Cardiopulmonary status limiting activity;Impaired UE functional use;Obesity;Impaired tone;Decreased coordination;Impaired vision/perception;Decreased range of motion      OT Treatment/Interventions: Self-care/ADL training;DME and/or AE instruction;Therapeutic activities;Cognitive remediation/compensation;Patient/family education;Balance training;Therapeutic exercise;Splinting;Visual/perceptual remediation/compensation    OT Goals(Current goals can be  found in the care plan section) Acute Rehab OT Goals Patient Stated Goal: fall less OT Goal Formulation: With patient Time For Goal  Achievement: 10/04/19 Potential to Achieve Goals: Good  OT Frequency: Min 2X/week   Barriers to D/C:            Co-evaluation PT/OT/SLP Co-Evaluation/Treatment: Yes Reason for Co-Treatment: For patient/therapist safety;To address functional/ADL transfers   OT goals addressed during session: ADL's and self-care      AM-PAC OT "6 Clicks" Daily Activity     Outcome Measure Help from another person eating meals?: A Lot Help from another person taking care of personal grooming?: A Lot Help from another person toileting, which includes using toliet, bedpan, or urinal?: Total Help from another person bathing (including washing, rinsing, drying)?: A Lot Help from another person to put on and taking off regular upper body clothing?: A Lot Help from another person to put on and taking off regular lower body clothing?: Total 6 Click Score: 10   End of Session Equipment Utilized During Treatment: Gait belt Nurse Communication: Mobility status  Activity Tolerance: Patient tolerated treatment well Patient left: in bed;with call bell/phone within reach;with family/visitor present  OT Visit Diagnosis: Other abnormalities of gait and mobility (R26.89);Muscle weakness (generalized) (M62.81);Repeated falls (R29.6);Other symptoms and signs involving cognitive function                Time: 9144-4584 OT Time Calculation (min): 46 min Charges:  OT General Charges $OT Visit: 1 Visit OT Evaluation $OT Eval Moderate Complexity: 1 Mod OT Treatments $Self Care/Home Management : 8-22 mins  Jolaine Artist, OT Acute Rehabilitation Services Pager [phone] Office [phone]   [name] 09/20/2019, 1:50 PM

## 2019-09-20 NOTE — Telephone Encounter (Signed)
Patient's wife Vicente Males called to report the patient is in the hospital after a fall. Reportedly, he fell and hit his eye and they are trying to save it. He'll be at [facility] until Monday, 09/23/19.   She'd like to know if the MRI the patient needs can be done while he is at [place]? If so, the order will need changed/ updated.

## 2019-09-20 NOTE — Progress Notes (Signed)
[facility]                                                                              Patient Demographics  Nicholas Ramirez, is a 74 y.o. male, DOB - 09-16-1945, LYY:[id number]  Admit date - 09/18/2019   Admitting Physician Eben Burow, MD  Outpatient Primary MD for the patient is Townsend Roger, MD  Outpatient specialists:   LOS - 2  days   Medical records reviewed and are as summarized below:    Chief Complaint  Patient presents with  . Fall       Brief summary   Patient is a 74 year old male with HTN, heart murmur, cataracts, hepatitis C presented to EMS after a fall at home.  Patient had multiple falls over the last few months at home.  He has been evaluated at outpatient neurology (Dr Tomi Likens) has had CT scan and MRI of his brain performed.  He is scheduled for an MRI of his C-spine in the near future the neurologist ordered.  He reported he has had generalized weakness with a slow unsteady gait.  His daughter has not seen him walking recently and is unsure if he has a shuffling gait.  He was recently started on levodopa carbidopa for possible Parkinson's disease by neurology.  Reports he was at home tonight when he tripped over something and fell and hit the left side of his head on his dresser as he fell.  He sustained a laceration to the side of his eye and hit his eye.  He was unable to see and EMS was called.  He reports that he had normal vision with his glasses on in both eyes until this fall.  He reports he cannot see out of the left eye after his fall.  He was found to have hyphema and possible hematoma in left eye on CT scan.  He has not had any chest pain palpitation, nausea, vomiting or diarrhea, shortness of breath, cough.  He denies any loss of consciousness with the fall  ED Course: CT scan of his orbits shows probable rupture of anterior left globe.  Ophthalmology has been consulted and evaluated patient and plans for surgical intervention  to try to save the eye.  Patient was given broad-spectrum antibiotics in the emergency room as well as a tetanus shot.    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 Roda Shutters recommendations, visual prognosis for the eye is extremely guarded, eye shield at all times -Per recommendations, continue broad-spectrum IV antibiotics and transitioning to p.o. broad-spectrum on discharge, eyedrops, follow-up with ophthalmology outpatient on Monday   Active Problems: Accelerated hypertension -BP on admission elevated however soft today, will hold off on lisinopril, and amlodipine.   -Continue propranolol  -Placed on IV fluid hydration  Acute kidney injury, acute urinary retention -Patient did have an episode of acute urinary retention earlier this morning,  has history of BPH -Creatinine 2.71, hold vancomycin, lisinopril, avoid hypotension -Placed on IV fluid hydration, renal ultrasound to rule out any obstruction or hydronephrosis -Resume finasteride, mirabegron ER  -Recheck BMET later today   Mechanical fall, having recurrent falls at home, recent diagnosis of Parkinson's -PT OT evaluation recommended CIR, consult placed -Reviewed recent neurology note by Dr. Tomi Likens, had recommended starting Sinemet with up titration weekly, MRI of the C-spine. (Dr. Tomi Likens requested if this can be done inpatient) -Follow MRI C-spine, started Sinemet, discussed with the patient and daughter (who requested Sinemet to be started here as was recommended by outpatient neurology)    Hyponatremia -Serum osmolality 275, urine osmolality 41, urine sodium 108, SIADH component  -IV fluids were held yesterday, HCTZ was discontinued -Sodium  improving to 127 however due to acute kidney injury, will resume IV fluids    Prolonged QT interval Avoid QT prolonging medications -Monitor K, magnesium   Obesity Estimated body mass index is 38.19 kg/m as calculated from the following:   Height as of this encounter: 5\' 9"  (1.753 m).   Weight as of this encounter: 117.3 kg.  Code Status full CODE STATUS DVT Prophylaxis: SCDs Family Communication: Discussed all imaging results, lab results, explained to the patient's daughter at the bedside and wife on the phone   Disposition Plan:     Status is: Inpatient  Remains inpatient appropriate because:Inpatient level of care appropriate due to severity of illness   Dispo: The patient is from: Home              Anticipated d/c is to: CIR              Anticipated d/c date is: 2 days              Patient currently is not medically stable to d/c.      Time Spent in minutes   77mins    Procedures:    Consultants:   Ophthalmology Antimicrobials:   Anti-infectives (From admission, onward)   Start     Dose/Rate Route Frequency Ordered Stop   09/20/19 2200  cefTAZidime (FORTAZ) 1 g in sodium chloride 0.9 % 100 mL IVPB       Note to Pharmacy: Ruptured left eye globe after fall at home and hit head on furniture   1 g 200 mL/hr over 30 Minutes Intravenous Every 12 hours 09/20/19 1255     09/20/19 1231  vancomycin variable dose per unstable renal function (pharmacist dosing)  Status:  Discontinued         Does not apply See admin instructions 09/20/19 1231 09/20/19 1242   09/19/19 0800  vancomycin (VANCOCIN) IVPB 1000 mg/200 mL premix  Status:  Discontinued        1,000 mg 200 mL/hr over 60 Minutes Intravenous Every 12 hours 09/18/19 1932 09/20/19 1231   09/19/19 0300  cefTAZidime (FORTAZ) 1 g in sodium chloride 0.9 % 100 mL IVPB  Status:  Discontinued       Note to Pharmacy: Ruptured left eye globe after fall at home and hit head on furniture   1 g 200 mL/hr over 30 Minutes  Intravenous Every 8 hours 09/19/19 0138 09/20/19 1255   09/18/19 2304  polymyxin 500,000 units/ceftazidime 1 g ophthalmic irrigation  Status:  Discontinued          As needed 09/18/19 2304 09/19/19 0044   09/18/19 2000  vancomycin (VANCOCIN) 2,500 mg in sodium chloride 0.9 % 500 mL IVPB  2,500 mg 250 mL/hr over 120 Minutes Intravenous  Once 09/18/19 1903 09/18/19 2243   09/18/19 1845  cefTAZidime (FORTAZ) 2 g in sodium chloride 0.9 % 100 mL IVPB        2 g 200 mL/hr over 30 Minutes Intravenous  Once 09/18/19 1809 09/18/19 1916         Medications  Scheduled Meds: . amLODipine  10 mg Oral Daily  . carbidopa-levodopa  0.5 tablet Oral BID  . DULoxetine  60 mg Oral Daily  . finasteride  5 mg Oral Daily  . gatifloxacin  1 drop Left Eye QID  . loratadine  10 mg Oral Daily  . mirabegron ER  50 mg Oral Daily  . neomycin-polymyxin b-dexamethasone  1 application Left Eye QID  . prednisoLONE acetate  1 drop Left Eye QID  . propranolol  40 mg Oral BID   Continuous Infusions: . sodium chloride 100 mL/hr at 09/20/19 1257  . cefTAZidime (FORTAZ)  IV     PRN Meds:.hydrALAZINE, HYDROcodone-acetaminophen, HYDROmorphone (DILAUDID) injection, promethazine, senna-docusate      Subjective:   Finnigan Warriner was seen and examined today.  Earlier this morning having any urinary retention issues, in and out cath done.  Currently alert and oriented, no acute complaints, no nausea or vomiting.  Objective:   Vitals:   09/19/19 2119 09/19/19 2247 09/20/19 0508 09/20/19 0904  BP: 100/69 98/65 111/69 109/68  Pulse: 71 76 80 80  Resp: 14  14 16   Temp: 98.1 F (36.7 C)  98.3 F (36.8 C) 98.5 F (36.9 C)  TempSrc:    Oral  SpO2: 93%  94% 93%  Weight: 117.3 kg     Height:        Intake/Output Summary (Last 24 hours) at 09/20/2019 1510 Last data filed at 09/20/2019 1100 Gross per 24 hour  Intake 920 ml  Output 520 ml  Net 400 ml     Wt Readings from Last 3 Encounters:  09/19/19  117.3 kg  09/16/19 111.1 kg    Physical Exam  General: Alert and oriented x 3, NAD, left eye shield on  Cardiovascular: S1 S2 clear, RRR. No pedal edema b/l  Respiratory: CTAB, no wheezing, rales or rhonchi  Gastrointestinal: Soft, nontender, nondistended, NBS  Ext: no pedal edema bilaterally  Neuro: no new deficits  Musculoskeletal: No cyanosis, clubbing  Skin: No rashes  Psych: Normal affect and demeanor, alert and oriented x3     Data Reviewed:  I have personally reviewed following labs and imaging studies  Micro Results Recent Results (from the past 240 hour(s))  SARS Coronavirus 2 by RT PCR (hospital order, performed in [place] hospital lab) Nasopharyngeal Nasopharyngeal Swab     Status: None   Collection Time: 09/18/19  6:00 PM   Specimen: Nasopharyngeal Swab  Result Value Ref Range Status   SARS Coronavirus 2 NEGATIVE NEGATIVE Final    Comment: (NOTE) SARS-CoV-2 target nucleic acids are NOT DETECTED.  The SARS-CoV-2 RNA is generally detectable in upper and lower respiratory specimens during the acute phase of infection. The lowest concentration of SARS-CoV-2 viral copies this assay can detect is 250 copies / mL. A negative result does not preclude SARS-CoV-2 infection and should not be used as the sole basis for treatment or other patient management decisions.  A negative result may occur with improper specimen collection / handling, submission of specimen other than nasopharyngeal swab, presence of viral mutation(s) within the areas targeted by this assay, and inadequate number of viral copies (<  250 copies / mL). A negative result must be combined with clinical observations, patient history, and epidemiological information.  Fact Sheet for Patients:   StrictlyIdeas.no  Fact Sheet for Healthcare Providers: BankingDealers.co.za  This test is not yet approved or  cleared by the [place] FDA and has  been authorized for detection and/or diagnosis of SARS-CoV-2 by FDA under an Emergency Use Authorization (EUA).  This EUA will remain in effect (meaning this test can be used) for the duration of the COVID-19 declaration under Section 564(b)(1) of the Act, 21 U.S.C. section 360bbb-3(b)(1), unless the authorization is terminated or revoked sooner.  Performed at [facility], [place] [address], [place], [place] [address]     Radiology Reports CT Head Wo Contrast  Result Date: 09/18/2019 CLINICAL DATA:  Fall with left orbital trauma EXAM: CT HEAD AND ORBITS WITHOUT CONTRAST TECHNIQUE: Contiguous axial images were obtained from the base of the skull through the vertex without contrast. Multidetector CT imaging of the orbits was performed using the standard protocol without intravenous contrast. COMPARISON:  None. FINDINGS: CT HEAD FINDINGS Brain: No evidence of acute infarction, hemorrhage, hydrocephalus, extra-axial collection or mass lesion/mass effect. Vascular: No hyperdense vessel or unexpected calcification. Skull: Normal. Negative for fracture or focal lesion. Other: None. CT ORBITS FINDINGS Orbits: There is heterogeneous density material throughout all chambers of the left lobe. There is suspected globe disruption along the anterior aspect best seen series 10, image 63. The left optic nerve and extraocular muscles are normal. The right orbit is normal. Visualized sinuses: Clear. Soft tissues: Mild left periorbital soft tissue swelling. IMPRESSION: 1. No acute intracranial abnormality. 2. Suspected globe disruption along the anterior aspect of the left globe with intra-ocular hemorrhage. 3. Mild left periorbital soft tissue swelling. Electronically Signed   By: Ulyses Jarred M.D.   On: 09/18/2019 19:46   CT Orbits Wo Contrast  Result Date: 09/18/2019 CLINICAL DATA:  Fall with left orbital trauma EXAM: CT HEAD AND ORBITS WITHOUT CONTRAST TECHNIQUE: Contiguous axial images were obtained from  the base of the skull through the vertex without contrast. Multidetector CT imaging of the orbits was performed using the standard protocol without intravenous contrast. COMPARISON:  None. FINDINGS: CT HEAD FINDINGS Brain: No evidence of acute infarction, hemorrhage, hydrocephalus, extra-axial collection or mass lesion/mass effect. Vascular: No hyperdense vessel or unexpected calcification. Skull: Normal. Negative for fracture or focal lesion. Other: None. CT ORBITS FINDINGS Orbits: There is heterogeneous density material throughout all chambers of the left lobe. There is suspected globe disruption along the anterior aspect best seen series 10, image 63. The left optic nerve and extraocular muscles are normal. The right orbit is normal. Visualized sinuses: Clear. Soft tissues: Mild left periorbital soft tissue swelling. IMPRESSION: 1. No acute intracranial abnormality. 2. Suspected globe disruption along the anterior aspect of the left globe with intra-ocular hemorrhage. 3. Mild left periorbital soft tissue swelling. Electronically Signed   By: Ulyses Jarred M.D.   On: 09/18/2019 19:46    Lab Data:  CBC: Recent Labs  Lab 09/18/19 1811 09/19/19 0341  WBC 6.8 6.0  NEUTROABS 3.7  --   HGB 12.6* 12.7*  HCT 34.8* 34.6*  MCV 96.7 94.3  PLT 103* 812*   Basic Metabolic Panel: Recent Labs  Lab 09/18/19 1811 09/19/19 0341 09/20/19 0737  NA 123* 125* 127*  K 3.5 4.0 4.6  CL 91* 94* 95*  CO2 22 18* 21*  GLUCOSE 101* 133* 107*  BUN 6* 8 25*  CREATININE 0.80 0.79  2.71*  CALCIUM 7.9* 8.1* 8.2*   GFR: Estimated Creatinine Clearance: 31.1 mL/min (A) (by C-G formula based on SCr of 2.71 mg/dL (H)). Liver Function Tests: No results for input(s): AST, ALT, ALKPHOS, BILITOT, PROT, ALBUMIN in the last 168 hours. No results for input(s): LIPASE, AMYLASE in the last 168 hours. No results for input(s): AMMONIA in the last 168 hours. Coagulation Profile: Recent Labs  Lab 09/18/19 1811  INR 1.3*    Cardiac Enzymes: No results for input(s): CKTOTAL, CKMB, CKMBINDEX, TROPONINI in the last 168 hours. BNP (last 3 results) No results for input(s): PROBNP in the last 8760 hours. HbA1C: No results for input(s): HGBA1C in the last 72 hours. CBG: No results for input(s): GLUCAP in the last 168 hours. Lipid Profile: No results for input(s): CHOL, HDL, LDLCALC, TRIG, CHOLHDL, LDLDIRECT in the last 72 hours. Thyroid Function Tests: No results for input(s): TSH, T4TOTAL, FREET4, T3FREE, THYROIDAB in the last 72 hours. Anemia Panel: No results for input(s): VITAMINB12, FOLATE, FERRITIN, TIBC, IRON, RETICCTPCT in the last 72 hours. Urine analysis: No results found for: COLORURINE, APPEARANCEUR, LABSPEC, PHURINE, GLUCOSEU, HGBUR, BILIRUBINUR, KETONESUR, PROTEINUR, UROBILINOGEN, NITRITE, LEUKOCYTESUR   Cary Lothrop M.D. [facility] 09/20/2019, 3:10 PM   Call night coverage person covering after 7pm

## 2019-09-20 NOTE — TOC CAGE-AID Note (Signed)
Transition of Care [facility]) - CAGE-AID Screening   Patient Details  Name: Nicholas Ramirez MRN: [id number] Date of Birth: 1945/12/04  [facility] CM/SW Contact:    Emeterio Reeve, [place] Phone Number: 09/20/2019, 2:19 PM   Clinical Narrative:  CSW met with pt at bedside. CSW introduced self and explained her role at the hospital.  PT denies alcohol use and substance use. Pt did not need any resources at this time.    CAGE-AID Screening:    Have You Ever Felt You Ought to Cut Down on Your Drinking or Drug Use?: No Have People Annoyed You By Critizing Your Drinking Or Drug Use?: No Have You Felt Bad Or Guilty About Your Drinking Or Drug Use?: No Have You Ever Had a Drink or Used Drugs First Thing In The Morning to Steady Your Nerves or to Get Rid of a Hangover?: No CAGE-AID Score: 0  Substance Abuse Education Offered: Yes    Blima Ledger, [place] Social Worker [phone]

## 2019-09-20 NOTE — Plan of Care (Signed)
  Problem: Health Behavior/Discharge Planning: Goal: Ability to manage health-related needs will improve Outcome: Progressing   Problem: Activity: Goal: Risk for activity intolerance will decrease Outcome: Progressing   Problem: Elimination: Goal: Will not experience complications related to urinary retention Outcome: Progressing

## 2019-09-20 NOTE — Progress Notes (Signed)
Physical Therapy Treatment Patient Details Name: Nicholas Ramirez MRN: [id number] DOB: 03/27/1945 Today's Date: 09/20/2019    History of Present Illness The pt is a 74 yo male presenting after a fall at home that resulted in injury to his L eye (rupture of anterior left globe). PMH includes: HTN, heart murmur, cataracts, hepatitis C, and ongoing workup for Parkinson's Disease with outpatient neurology.    PT Comments    The pt was in bed upon arrival of PT/OT, agreeable to session with focus on progression of OOB mobility and balance. The pt required increased assist to complete bed mobility and maintain seated balance during today's session, requiring modA of 2 to reach EOB and mod/maxA initially to maintain seated balance. The pt continued to drift posteriorly through the session, but was better able to recognize the movement and correct without cues by the end of the session. The pt was able to complete sit-stand with improved technique today, but requires significant assist of 2 and multimodal cues to complete the transition and mod/maxA to stabilize in stance. The pt continues to be a great candidate for intensive therapies due to his great family support, significant risk of continued falls and fall-related injury, and to reduce burden on caregivers at home.     Follow Up Recommendations  CIR     Equipment Recommendations   (defer to post acute)    Recommendations for Other Services       Precautions / Restrictions Precautions Precautions: Fall Precaution Comments: pt admitted for a fall, daughter reports 15-20 in last 6 months Restrictions Weight Bearing Restrictions: No    Mobility  Bed Mobility Overal bed mobility: Needs Assistance Bed Mobility: Supine to Sit;Sit to Supine     Supine to sit: HOB elevated;Max assist Sit to supine: Max assist;+2 for physical assistance;+2 for safety/equipment   General bed mobility comments: max assist for trunk and LB support to EOB,  requires greatly increased time and multimodal cueing with poor sequencing and balance   Transfers Overall transfer level: Needs assistance Equipment used: 2 person hand held assist Transfers: Sit to/from Stand Sit to Stand: Mod assist;+2 physical assistance;+2 safety/equipment         General transfer comment: mod assist +2 to power up and steady using momentum/rocking to encouarge foward lean; mod-max assist +2 to maintain static standing due to heavy posterior lean  Ambulation/Gait Ambulation/Gait assistance: Max assist Gait Distance (Feet): 3 Feet Assistive device: 2 person hand held assist Gait Pattern/deviations: Step-to pattern;Shuffle   Gait velocity interpretation: <1.31 ft/sec, indicative of household ambulator General Gait Details: pt with difficulty clearing BLE to take steps, benefits from verbal cues and facilitation of wt shift to complete lateral steps in both directions.      Balance Overall balance assessment: Needs assistance Sitting-balance support: No upper extremity supported;Feet supported;Single extremity supported Sitting balance-Leahy Scale: Poor Sitting balance - Comments: fluctates due to posterior lean, min-mod assist but preference to at least 1 UE support Postural control: Posterior lean Standing balance support: Bilateral upper extremity supported;During functional activity Standing balance-Leahy Scale: Poor Standing balance comment: relies on external support                            Cognition Arousal/Alertness: Awake/alert Behavior During Therapy: WFL for tasks assessed/performed Overall Cognitive Status: Impaired/Different from baseline Area of Impairment: Safety/judgement;Problem solving;Attention;Following commands;Awareness                   Current Attention  Level: Sustained   Following Commands: Follows one step commands consistently;Follows one step commands with increased time;Follows multi-step commands  inconsistently Safety/Judgement: Decreased awareness of safety;Decreased awareness of deficits Awareness: Emergent Problem Solving: Slow processing;Decreased initiation;Difficulty sequencing;Requires tactile cues;Requires verbal cues General Comments: patient following commands but requires increased time and multimodal cueing for multistep commands during functional tasks, fair insight to deficts but requires constant redirection to tasks and repitition      Exercises      General Comments General comments (skin integrity, edema, etc.): daughter present and supportive      Pertinent Vitals/Pain Pain Assessment: Faces Faces Pain Scale: Hurts a little bit Pain Location: back soreness Pain Descriptors / Indicators: Grimacing;Sore Pain Intervention(s): Limited activity within patient's tolerance;Monitored during session;Repositioned    Home Living Family/patient expects to be discharged to:: Private residence Living Arrangements: Spouse/significant other Available Help at Discharge: Available 24 hours/day (granddaughers come to visit and assist as needed) Type of Home: House Home Access: Level entry   Home Layout: One level Home Equipment: Hand held shower head;Shower seat Additional Comments: no AD or grab bars. pt unable to grasp with LUE    Prior Function Level of Independence: Needs assistance  Gait / Transfers Assistance Needed: supervision for gait with HHA, pt with multiple falls at home ADL's / Homemaking Assistance Needed: spouse assist with bathing, dressing; pt able to feed and groom Comments: high frequency of falls, unable to grip or use LUE functionally following carpal tunnel surgery a few years ago   PT Goals (current goals can now be found in the care plan section) Acute Rehab PT Goals Patient Stated Goal: fall less PT Goal Formulation: With patient/family Time For Goal Achievement: 10/03/19 Potential to Achieve Goals: Good Progress towards PT goals:  Progressing toward goals    Frequency    Min 3X/week      PT Plan Current plan remains appropriate    Co-evaluation PT/OT/SLP Co-Evaluation/Treatment: Yes Reason for Co-Treatment: For patient/therapist safety;Necessary to address cognition/behavior during functional activity;To address functional/ADL transfers PT goals addressed during session: Mobility/safety with mobility;Balance;Strengthening/ROM OT goals addressed during session: ADL's and self-care      AM-PAC PT "6 Clicks" Mobility   Outcome Measure  Help needed turning from your back to your side while in a flat bed without using bedrails?: A Little Help needed moving from lying on your back to sitting on the side of a flat bed without using bedrails?: A Lot Help needed moving to and from a bed to a chair (including a wheelchair)?: A Lot Help needed standing up from a chair using your arms (e.g., wheelchair or bedside chair)?: A Lot Help needed to walk in hospital room?: Total Help needed climbing 3-5 steps with a railing? : Total 6 Click Score: 11    End of Session Equipment Utilized During Treatment: Gait belt Activity Tolerance: Patient tolerated treatment well Patient left: in bed;with call bell/phone within reach;with family/visitor present Nurse Communication: Mobility status PT Visit Diagnosis: Muscle weakness (generalized) (M62.81);History of falling (Z91.81);Repeated falls (R29.6);Difficulty in walking, not elsewhere classified (R26.2)     Time: 9326-7124 PT Time Calculation (min) (ACUTE ONLY): 48 min  Charges:  $Gait Training: 8-22 mins                     Karma Ganja, PT, DPT   Acute Rehabilitation Department Pager #: [phone]   Otho Bellows 09/20/2019, 4:16 PM

## 2019-09-20 NOTE — Anesthesia Postprocedure Evaluation (Signed)
Anesthesia Post Note  Patient: Nicholas Ramirez  Procedure(s) Performed: REPAIR OF RUPTURED GLOBE (Left Eye)     Patient location during evaluation: PACU Anesthesia Type: General Level of consciousness: awake and alert Pain management: pain level controlled Vital Signs Assessment: post-procedure vital signs reviewed and stable Respiratory status: spontaneous breathing, nonlabored ventilation and respiratory function stable Cardiovascular status: blood pressure returned to baseline and stable Postop Assessment: no apparent nausea or vomiting Anesthetic complications: no   No complications documented.  Last Vitals:  Vitals:   09/20/19 0508 09/20/19 0904  BP: 111/69 109/68  Pulse: 80 80  Resp: 14 16  Temp: 36.8 C 36.9 C  SpO2: 94% 93%    Last Pain:  Vitals:   09/20/19 0904  TempSrc: Oral  PainSc:                  Audry Pili

## 2019-09-20 NOTE — Progress Notes (Signed)
Rehab Admissions Coordinator Note:  Patient was screened by Cleatrice Burke for appropriateness for an Inpatient Acute Rehab Consult per therapy recs.   At this time, we are recommending Inpatient Rehab consult. I will place order per protocol.  Cleatrice Burke RN MSN 09/20/2019, 11:22 AM  I can be reached at [phone].

## 2019-09-20 NOTE — Op Note (Signed)
Indication: Ocular laceration and rupture with prolapse or loss of intraocular tissue, left eye, initial encounter S05.22XA Surgeon: Lonia Skinner, MD Anesthesia: General Procedure: Repair of open globe, scleral laceration with resection of prolapsed uveal tissue, left eye  Procedure Details:   After proper informed consent was obtained, the patient was taken to the operating room where the patient was placed under general anesthesia in the supine position.  The patient was then prepped and draped in the usual fashion, taking care to avoid excessive pressure on the globe.  A speculum was then placed with the surgical microscope brought into place.   The prolapsed uveal tissue was thoroughly irrigated with balanced salt solution and the eye was further examined using the operative microscope. There were no foreign bodies identified. There was a large scleral laceration/rupture extending from 5 o'clock inferiorly all the way up to 9 o'clock nasally. The mid-section of the scleral rupture was at the limbus and the ends of each side extended posteriorly from the limbus. The superior edge of the scleral laceration extended to the base of the medial rectus muscle. There was significant uveal prolapse and surrounding blood and significant wound gape in the scleral rupture. The anterior chamber was poorly visible due to complete hyphema.  A paracentesis was made in the cornea superotemporally using an MVR blade and dispersive viscoelastic was injected to reform the anterior chamber. A conjunctival peritomy was made superiorly and inferiorly around and extending from the edges of the scleral laceration and prolapsed uveal tissue using a combination of blunt and sharp dissection with Wescott scissors. The peritomy extended from approximately 5 o'clock to 12 o'clock.   The scleral laceration was closed using multiple 9-0 interrupted Nylon sutures, beginning at the edges of the laceration. An attempt was made to  reposit uveal tissue, but this was not possible. The prolapsed uveal tissue was cut flush at the base of the laceration and sent as specimen. The closure of the scleral laceration continued using multiple 9-0 Nylon sutures, replacing loose sutures as the laceration was zippered close and as further tension on the wound brought the edges closer together. Nylon sutures were tied and knots rotated into the tissue as they were placed. Despite multiple rounds of successively tighter sutures, there was tissue loss in the sclera and complete apposition of the scleral edges was not possible. This achieved a relatively water-tight closure, though again limited by tissue loss and edema.   The other quadrants of sclera were thoroughly inspected and no more lacerations were observed. The conjunctival peritomy was closed using multiple 8-0 Vicryl sutures in an interrupted fashion with the knots buried under the conjunctiva.   Hemostasis was achieved. A mixture of polymixin and ceftazidime was injected subconjunctivally. Maxitrol ointment was placed over the eye and a patch and shield were applied. The patient tolerated the procedure well.  Specimens: Prolapsed Uveal tissue, left eye Complications: none Disposition: The patient will return to the hospital floor and the patch and shield should be left on until patient is seen by ophthalmology tomorrow.

## 2019-09-20 NOTE — Progress Notes (Addendum)
Pharmacy Antibiotic Note  Nicholas Ramirez is a 74 y.o. male on day# 3 Vancomycin and Ceftazidime for ruptured globe left eye s/p fall PTA.    Creatinine up today and Vancomycin held.  Last Vanc 1gm IV dose given at 8:48am..  Vanc trough level tonight is supratherapeutic at 35 mcg/ml. Creatinine has trended up further tonight.  Lisinopril also stopped and NS at 100 ml/hr added.    Plan:  Continue to hold Vancomycin  Ceftazidime 1 gm IV adjusted from q8h > q12h today.  May need to adjust to q24h if Scr remains elevated.  Will follow renal function, clinical progress and antibiotic plans.  Will recheck Vanc random level in 1-2 days, depending on Scr trend.   Target Vanc troughs 15-20 mcg/ml.  Re-dose Vancomycin when level down into target trough range, if Vanc to continue.   Height: 5\' 9"  (175.3 cm) Weight: 117.3 kg (258 lb 9.6 oz) IBW/kg (Calculated) : 70.7  Temp (24hrs), Avg:98.3 F (36.8 C), Min:98.1 F (36.7 C), Max:98.5 F (36.9 C)  Recent Labs  Lab 09/18/19 1811 09/19/19 0341 09/20/19 0737 09/20/19 1855  WBC 6.8 6.0  --   --   CREATININE 0.80 0.79 2.71* 3.54*  VANCOTROUGH  --   --   --  35*    Estimated Creatinine Clearance: 23.8 mL/min (A) (by C-G formula based on SCr of 3.54 mg/dL (H)).    Allergies  Allergen Reactions  . Oxybutynin Other (See Comments)    cramps  . Sertraline Itching  . Tadalafil Rash    Antimicrobials this admission:  Vancomycin 9/15 >>  Ceftazidime 9/15 >>  Dose adjustments this admission:  9/17: VT = 35 mcg/ml - on 1gm IV q12h - now on hold   - Scr 0.79 > 2.71 > now 3.54 tonight  Microbiology results:  9/15 COVID: negative   Thank you for allowing pharmacy to be a part of this patient's care.  Arty Baumgartner, [place] Phone: [phone] 09/20/2019 8:04 PM

## 2019-09-20 NOTE — Telephone Encounter (Signed)
She can ask her treating physicians if they can order the MRI of the cervical spine and have them review my note.  If they will do that, then we can cancel the outpatient order.

## 2019-09-20 NOTE — Telephone Encounter (Signed)
Advised Pt Wife to see if the Attending Provider will ordered the Mri if so we will delete our order.

## 2019-09-20 NOTE — Progress Notes (Signed)
PHARMACY NOTE:  ANTIMICROBIAL RENAL DOSAGE ADJUSTMENT  Current antimicrobial regimen includes a mismatch between antimicrobial dosage and estimated renal function.  As per policy approved by the Pharmacy & Therapeutics and Medical Executive Committees, the antimicrobial dosage will be adjusted accordingly.  Current antimicrobial dosage:  Ceftazidime 1g IV every 8 hours Indication: Wound infection (Ruptured globe of left eye, open globe/letter rupture with uveal prolapse/loss of uvular tissue, complete hyphema) Renal Function: Scr increased to 2.71   Estimated Creatinine Clearance: 31.1 mL/min (A) (by C-G formula based on SCr of 2.71 mg/dL (H)). []      On intermittent HD, scheduled: []      On CRRT    Antimicrobial dosage has been changed to:  Ceftazidime 1 gm IV every 12 hours.  Additional comments:  Pharmacy dosing Vancomycin per consult.  We will hold vancomycin dose for now.  MD is starting IV fluids and will recheck BMET this evening.   Pharmacy will check Vancomycin trough level with this evenings labs and adjust vancomycin dose as needed for goal vancomycin trough 15-20 mcg/ml.     Thank you for allowing pharmacy to be a part of this patient's care. Nicole Cella, RPh Clinical Pharmacist [phone] Please check AMION for all [facility] phone numbers After 10:00 PM, call [place] [phone]  09/20/2019 12:56 PM

## 2019-09-21 LAB — BASIC METABOLIC PANEL
Anion gap: 10 (ref 5–15)
BUN: 38 mg/dL — ABNORMAL HIGH (ref 8–23)
CO2: 20 mmol/L — ABNORMAL LOW (ref 22–32)
Calcium: 8 mg/dL — ABNORMAL LOW (ref 8.9–10.3)
Chloride: 96 mmol/L — ABNORMAL LOW (ref 98–111)
Creatinine, Ser: 4.01 mg/dL — ABNORMAL HIGH (ref 0.61–1.24)
GFR calc Af Amer: 16 mL/min — ABNORMAL LOW (ref >60)
GFR calc non Af Amer: 14 mL/min — ABNORMAL LOW (ref >60)
Glucose, Bld: 95 mg/dL (ref 70–99)
Potassium: 4.2 mmol/L (ref 3.5–5.1)
Sodium: 126 mmol/L — ABNORMAL LOW (ref 135–145)

## 2019-09-21 MED ORDER — SODIUM CHLORIDE 0.9 % IV SOLN: INTRAVENOUS | Status: AC

## 2019-09-21 MED ORDER — SODIUM CHLORIDE 0.9 % IV SOLN
1.0000 g | INTRAVENOUS | Status: AC
  Administered 2019-09-21 – 2019-09-24 (×4): 1 g via INTRAVENOUS
  Filled 2019-09-21 (×5): qty 1

## 2019-09-21 MED ORDER — HYDROCODONE-ACETAMINOPHEN 5-325 MG PO TABS
1.0000 | ORAL_TABLET | ORAL | Status: DC | PRN
  Administered 2019-09-21 – 2019-09-27 (×8): 2 via ORAL
  Administered 2019-09-30: 1 via ORAL
  Filled 2019-09-21 (×5): qty 2
  Filled 2019-09-21: qty 1
  Filled 2019-09-21 (×3): qty 2

## 2019-09-21 NOTE — Consult Note (Signed)
[facility]  INPATIENT CONSULTATION  Reason for Consultation: AKI Requesting Provider: Dr. Dwyane Dee  HPI: Nicholas Ramirez is an 74 y.o. male with HTN, HCV, depression who presented 09/18/19 after fall at home; nephrology is consulted for AKI.   Presents on background of several falls over months, possible Park[place]. He fell day of admission, trauma, loss of vision L eye.  Imaging with ruptured globe.  Went to OR PM of 9/15 - general anesthesia, Maps initially 110, then 70s throughout case; no frank hypotension.   On presentation Cr 0.8 9/15 > 9.16 0.8 > 9/17 2.7 > 3.54 > 9/18 4.01.  Serum sodium initially 126 and has trended to 127 >  126 during admission;  9/16 urine osm 481, urine sodium 108.  Serum osm 275.  HCTZ was d/c'd 09/22/19 Renal US R 10.4, L 11.4, normal echogenicity, no retention.  Since the afternoon of 9/16 BPs have been 80-110s/60-70s. Amlodipine 10mg  9/17, lisinopril 9/16 and 9/17, inderal d/c'd afternoon 9/17.  No NSAIDs.  He rec'd IV hydration yesterday. I/Os show UOP 542mL yesterday, no UOP documented today. Per notes had urinary retention yesterday AM.  Rec'd ceftaz, vanc 2.5g loading and 1q12h until PM of 9/17- level 35 on 9/17.   Home meds include HCTZ, lisinopril 40, inderal 40 BID, mybetriq 40 daily, sinemet.   Daughter (doctoral nursing candidate to grad 05/2020) at bedside today -- feels he's sleepy from pain meds and not eating and drinking much.  Notes LE edema for several months.  Never had before.   LUE weak and atrophying since carpal tunnel sx 01/2018.   PMH: Past Medical History:  Diagnosis Date  . Asthma   . Depression   . Heart murmur   . Hepatitis C   . Hypertension    PSH: Past Surgical History:  Procedure Laterality Date  . CARPAL TUNNEL RELEASE Left   . RUPTURED GLOBE EXPLORATION AND REPAIR Left 09/18/2019   Procedure: REPAIR OF RUPTURED GLOBE;  Surgeon: Lonia Skinner, MD;  Location: [facility];  Service: Ophthalmology;  Laterality:  Left;     Past Medical History:  Diagnosis Date  . Asthma   . Depression   . Heart murmur   . Hepatitis C   . Hypertension     Medications:  I have reviewed the patient's current medications.  Medications Prior to Admission  Medication Sig Dispense Refill  . aspirin 81 MG EC tablet Take 81 mg by mouth daily.    . cetirizine (ZYRTEC) 10 MG tablet Take 10 mg by mouth daily.    . DULoxetine (CYMBALTA) 60 MG capsule Take 60 mg by mouth daily.    . finasteride (PROSCAR) 5 MG tablet Take 5 mg by mouth daily.    . hydrochlorothiazide (HYDRODIURIL) 25 MG tablet Take 25 mg by mouth daily.    Marland Kitchen lisinopril (ZESTRIL) 40 MG tablet Take 40 mg by mouth daily.    Marland Kitchen MYRBETRIQ 50 MG TB24 tablet Take 50 mg by mouth daily.    . propranolol (INDERAL) 40 MG tablet Take 40 mg by mouth 2 (two) times daily.    . carbidopa-levodopa (SINEMET IR) 25-100 MG tablet Take 1/2 tablet in AM and bedtime for one week, then 1/2 tablet three times daily for one week, then take 1 tablet three times daily. 90 tablet 0    ALLERGIES:   Allergies  Allergen Reactions  . Oxybutynin Other (See Comments)    cramps  . Sertraline Itching  . Tadalafil Rash    FAM HX:  History reviewed. No pertinent family history.  Social History:   reports that he quit smoking about 34 years ago. He has never used smokeless tobacco. He reports previous drug use. He reports that he does not drink alcohol.  ROS: 12 system ROS per HPI above  Blood pressure (!) 93/59, pulse 76, temperature 99.1 F (37.3 C), resp. rate 18, height 5\' 9"  (1.753 m), weight 117.3 kg, SpO2 92 %. PHYSICAL EXAM: Gen: obese man sleepy in bed  Eyes: anicteric, L with shield over  ENT: MM moist Neck: supple, no JVD CV:  RRR, II/VI SEM Abd:  Soft, obese Lungs: clear to bases, normal WOB GU: no foley Extr:  1+ pitting LE edema,trace on LUE with L wrist brace Neuro: oriented but sleepy, speaks slowly Skin:a few scattered ecchymoese no rashes   Results for  orders placed or performed during the hospital encounter of 09/18/19 (from the past 48 hour(s))  Basic metabolic panel     Status: Abnormal   Collection Time: 09/20/19  7:37 AM  Result Value Ref Range   Sodium 127 (L) 135 - 145 mmol/L   Potassium 4.6 3.5 - 5.1 mmol/L   Chloride 95 (L) 98 - 111 mmol/L   CO2 21 (L) 22 - 32 mmol/L   Glucose, Bld 107 (H) 70 - 99 mg/dL    Comment: Glucose reference range applies only to samples taken after fasting for at least 8 hours.   BUN 25 (H) 8 - 23 mg/dL   Creatinine, Ser 2.71 (H) 0.61 - 1.24 mg/dL    Comment: DELTA CHECK NOTED   Calcium 8.2 (L) 8.9 - 10.3 mg/dL   GFR calc non Af Amer 22 (L) >60 mL/min   GFR calc Af Amer 26 (L) >60 mL/min   Anion gap 11 5 - 15    Comment: Performed at [facility] [address], [place], [place] [address]  Basic metabolic panel     Status: Abnormal   Collection Time: 09/20/19  6:55 PM  Result Value Ref Range   Sodium 127 (L) 135 - 145 mmol/L   Potassium 4.2 3.5 - 5.1 mmol/L   Chloride 96 (L) 98 - 111 mmol/L   CO2 20 (L) 22 - 32 mmol/L   Glucose, Bld 137 (H) 70 - 99 mg/dL    Comment: Glucose reference range applies only to samples taken after fasting for at least 8 hours.   BUN 34 (H) 8 - 23 mg/dL   Creatinine, Ser 3.54 (H) 0.61 - 1.24 mg/dL   Calcium 8.0 (L) 8.9 - 10.3 mg/dL   GFR calc non Af Amer 16 (L) >60 mL/min   GFR calc Af Amer 19 (L) >60 mL/min   Anion gap 11 5 - 15    Comment: Performed at [facility] [address], [place], [place] [address]  Vancomycin, trough     Status: Abnormal   Collection Time: 09/20/19  6:55 PM  Result Value Ref Range   Vancomycin Tr 35 (HH) 15 - 20 ug/mL    Comment: CRITICAL RESULT CALLED TO, READ BACK BY AND VERIFIED WITH: T.ISAACS RN 1944 09/20/19 MCCORMICK K Performed at [facility], [place] [address] [place], [place] [address]   Basic metabolic panel     Status: Abnormal   Collection Time: 09/21/19  2:14 AM  Result Value Ref Range   Sodium 126  (L) 135 - 145 mmol/L   Potassium 4.2 3.5 - 5.1 mmol/L   Chloride 96 (L) 98 -  111 mmol/L   CO2 20 (L) 22 - 32 mmol/L   Glucose, Bld 95 70 - 99 mg/dL    Comment: Glucose reference range applies only to samples taken after fasting for at least 8 hours.   BUN 38 (H) 8 - 23 mg/dL   Creatinine, Ser 4.01 (H) 0.61 - 1.24 mg/dL   Calcium 8.0 (L) 8.9 - 10.3 mg/dL   GFR calc non Af Amer 14 (L) >60 mL/min   GFR calc Af Amer 16 (L) >60 mL/min   Anion gap 10 5 - 15    Comment: Performed at [facility] [address] [place], [place] [address]    MR CERVICAL SPINE WO CONTRAST  Result Date: 09/20/2019 CLINICAL DATA:  74 year old male with multiple falls. Left upper extremity weakness, muscle spasm. Myelopathy. EXAM: MRI CERVICAL SPINE WITHOUT CONTRAST TECHNIQUE: Multiplanar, multisequence MR imaging of the cervical spine was performed. No intravenous contrast was administered. COMPARISON:  Neck MRA 07/02/2019.  CT head and orbits 09/18/2019. FINDINGS: Alignment: Mild straightening of cervical lordosis. Mild degenerative appearing retrolisthesis at from C4 on C5-C6 on C7. Vertebrae: No marrow edema or evidence of acute osseous abnormality. Degenerative endplate marrow signal changes. Cord: Capacious spinal canal at most levels. Cervical and visible upper thoracic spinal cord remain within normal limits. Posterior Fossa, vertebral arteries, paraspinal tissues: Cervicomedullary junction is within normal limits. Negative visible posterior fossa. Preserved major vascular flow voids in the neck. Negative visible neck soft tissues. Disc levels: C2-C3:  Negative. C3-C4: Disc space loss. Mostly foraminal disc bulge and endplate spurring. No spinal stenosis. Moderate bilateral C4 foraminal stenosis. C4-C5: Mild retrolisthesis and disc space loss. Circumferential disc osteophyte complex and mild posterior element hypertrophy. No significant spinal stenosis. Moderate to severe left and moderate right C5 foraminal  stenosis. C5-C6: Mild retrolisthesis with disc space loss and circumferential disc osteophyte complex. Mild posterior element hypertrophy. No significant spinal stenosis. Moderate to severe bilateral C6 foraminal stenosis. C6-C7: Mild retrolisthesis. Disc space loss. Mild circumferential disc osteophyte complex. Mild posterior element hypertrophy. No spinal stenosis. Mild to moderate C7 foraminal stenosis greater on the right. C7-T1: Negative disc. Mild to moderate facet hypertrophy. No stenosis. No visible upper thoracic spinal stenosis. There is bilateral upper thoracic facet hypertrophy. 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 But capacious spinal canal with no significant spinal stenosis. Normal cervical spinal cord. 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 Electronically Signed   By: Genevie Ann M.D.   On: 09/20/2019 19:25   US RENAL  Result Date: 09/20/2019 CLINICAL DATA:  Acute renal injury. EXAM: RENAL / URINARY TRACT ULTRASOUND COMPLETE COMPARISON:  None. FINDINGS: Right Kidney: Renal measurements: 10.4 cm x 4.4 cm x 5.1 cm = volume: 122.1 mL. Echogenicity within normal limits. No mass or hydronephrosis visualized. Left Kidney: Renal measurements: 11.4 cm x 6.6 cm x 5.8 cm = volume: 225.9 mL. Echogenicity within normal limits. No mass or hydronephrosis visualized. Bladder: Appears normal for degree of bladder distention. Other: None. IMPRESSION: Normal renal ultrasound. Electronically Signed   By: Virgina Norfolk M.D.   On: 09/20/2019 18:04    Assessment/Plan  **AKI:  Renal US ok. Given time course I suspect that this is hemodynamically mediated AKI/ATN in setting of protracted modest hypotension with possible contributors of hypovolemia (poor po intake), Vanc, urinary retention. No indications for dialysis and will give supportive care and avoid further insults --> need may arise, discussed with  daughter today --  Check UA, daily labs; further w/u if UA indicates --PVR q8h ordered, I/O cath if > 26mL  --NS 50/hr x 12h  **Hyponatremia: initial serum sodium 123, now 126.  Appears edematous (daughter notes x months) but in setting of sleepy and dec po intake could be intravasc depleted.  Certainly could have SIADH as well.   NS 50/hr per above and reassess fluid/diuretic need daily.  For completeness check TSH and cortisol in AM.  Strict I/Os, daily weights.   **HTN: currently hypotensive. Hold all meds.   **Thrombocytopenia: modest. Follow.  **Ruptured globe: per optho.  Justin Mend 09/21/2019, 2:37 PM

## 2019-09-21 NOTE — Progress Notes (Signed)
PROGRESS NOTE    Nicholas Ramirez  VZD:[id number] DOB: 03/21/1945 DOA: 09/18/2019 PCP: Townsend Roger, MD  Brief Narrative:  Patient is a 74 year old male with HTN, heart murmur, cataracts, hepatitis C presented to EMSafter a fall at home.  Patient had multiple falls over the last few months at home. He has been evaluated at outpatient neurology (Dr Tomi Likens) and has had CT scan and MRI of his brain performed. He reported he has had generalized weakness with a slow unsteady gait. He was recently started on levodopa, carbidopa for possible Parkinson's disease by neurology.He fell on the dresser and has sustained a laceration to the side of his eye and hit his eye. He was unable to see and EMS was called. He reports that he had normal vision with his glasses on in both eyes until this fall. He reports he cannot see out of the left eye after his fall. 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 Ophthalmology has been consulted and  He underwent surgical repair 9/16. Patient was given broad-spectrum antibiotics in the emergency room as well as a tetanus shot. Renal functions worsening, nephrology consulted.   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 -Patient was evaluated by ophthalmology, status post surgical repair on 9/16 am. -Ophthalmology following, appreciate Dr. Roda Shutters recommendations, visual prognosis for the eye is extremely guarded, eye shield at all times -Per recommendations, continue broad-spectrum IV antibiotics and  transitioning to p.o. broad-spectrum on discharge, eyedrops, follow-up with ophthalmology outpatient on Monday.   Active Problems: Accelerated hypertension -BP on admission elevated however been running soft , will hold off on lisinopril, and amlodipine.   -Continue propranolol. -Placed on IV fluid hydration  Acute kidney injury, acute urinary retention: -Patient did have an episode of acute urinary retention earlier this morning, has history of BPH -Creatinine 2.71, hold vancomycin, lisinopril, avoid hypotension. -Placed on IV fluid hydration, renal ultrasound ruled out any obstruction or hydronephrosis -Resume finasteride, mirabegron ER  -Recheck BMP showed worsening renal functions, Nephrology consulted. -Patient may require foley catheter if he continues to have urinary retention. -No indication for Hemodialysis at this time, continue supportive care. - Gentle hydration for 12 hrs.  Mechanical fall, recurrent falls at home, recent diagnosis of Parkinson's -PT OT evaluation recommended CIR, consult placed -Reviewed recent neurology note by Dr. Tomi Likens, had recommended starting Sinemet with up titration  weekly, MRI of the C-spine. (Dr. Tomi Likens requested if this can be done inpatient) -Follow MRI C-spine, started Sinemet, discussed with the patient and daughter (who requested Sinemet to be started here as was recommended by outpatient neurology)   Hyponatremia -Serum osmolality 275, urine osmolality 41, urine sodium 108, SIADH component  -IV fluids were held 9/17. Nephrology consulted, recommended gentle hydration x 12 hrs. -Sodium down to 126 however due to acute kidney injury, will resume IV fluids.   Prolonged QT interval Avoid QT prolonging medications -Monitor K, magnesium   Obesity Estimated body mass index is 38.19 kg/m as calculated from the following:   Height as of this encounter:  5\' 9"  (1.753 m).   Weight as of this encounter: 117.3 kg.   DVT  prophylaxis:SCDs. Code Status: Full Family Communication: Spoke with daughter at bed side. Disposition Plan:  Status is: Inpatient  Remains inpatient appropriate because:Inpatient level of care appropriate due to severity of illness   Dispo: The patient is from: Home              Anticipated d/c is to: CIR              Anticipated d/c date is: 3 days              Patient currently is not medically stable to d/c.   Consultants:   Ophthalmology, nephrology  Procedures: Antimicrobials:  Anti-infectives (From admission, onward)   Start     Dose/Rate Route Frequency Ordered Stop   09/21/19 2200  cefTAZidime (FORTAZ) 1 g in sodium chloride 0.9 % 100 mL IVPB       Note to Pharmacy: Ruptured left eye globe after fall at home and hit head on furniture   1 g 200 mL/hr over 30 Minutes Intravenous Every 24 hours 09/21/19 0731     09/20/19 2200  cefTAZidime (FORTAZ) 1 g in sodium chloride 0.9 % 100 mL IVPB  Status:  Discontinued       Note to Pharmacy: Ruptured left eye globe after fall at home and hit head on furniture   1 g 200 mL/hr over 30 Minutes Intravenous Every 12 hours 09/20/19 1255 09/21/19 0731   09/20/19 1231  vancomycin variable dose per unstable renal function (pharmacist dosing)  Status:  Discontinued         Does not apply See admin instructions 09/20/19 1231 09/20/19 1242   09/19/19 0800  vancomycin (VANCOCIN) IVPB 1000 mg/200 mL premix  Status:  Discontinued        1,000 mg 200 mL/hr over 60 Minutes Intravenous Every 12 hours 09/18/19 1932 09/20/19 1231   09/19/19 0300  cefTAZidime (FORTAZ) 1 g in sodium chloride 0.9 % 100 mL IVPB  Status:  Discontinued       Note to Pharmacy: Ruptured left eye globe after fall at home and hit head on furniture   1 g 200 mL/hr over 30 Minutes Intravenous Every 8 hours 09/19/19 0138 09/20/19 1255   09/18/19 2304  polymyxin 500,000 units/ceftazidime 1 g ophthalmic irrigation  Status:  Discontinued          As needed 09/18/19 2304 09/19/19  0044   09/18/19 2000  vancomycin (VANCOCIN) 2,500 mg in sodium chloride 0.9 % 500 mL IVPB        2,500 mg 250 mL/hr over 120 Minutes Intravenous  Once 09/18/19 1903 09/18/19 2243   09/18/19 1845  cefTAZidime (FORTAZ) 2 g in sodium chloride 0.9 % 100 mL IVPB        2 g 200 mL/hr over 30 Minutes Intravenous  Once 09/18/19 1809 09/18/19 1916      Subjective: Patient was seen and examined at bedside.  Overnight events noted.  Left eye covered with shield.  Reports pain is not controlled.  Daughter is at bedside all questions answered.  Objective: Vitals:   09/20/19 2127 09/20/19 2312 09/21/19 0528 09/21/19 0951  BP: (!) 89/58 96/70 100/65 (!) 93/59  Pulse: 80 84 71 76  Resp: 16  16 18   Temp: 98.6 F (37 C)  98.4 F (36.9 C) 99.1 F (37.3 C)  TempSrc:      SpO2: 94%  95% 92%  Weight: 117.3  kg     Height:        Intake/Output Summary (Last 24 hours) at 09/21/2019 1624 Last data filed at 09/21/2019 0900 Gross per 24 hour  Intake 2406.67 ml  Output 0 ml  Net 2406.67 ml   Filed Weights   09/18/19 1734 09/19/19 2119 09/20/19 2127  Weight: 111.1 kg 117.3 kg 117.3 kg    Examination:  General exam: Appears calm and comfortable , Left eye covered with shield. Respiratory system: Clear to auscultation. Respiratory effort normal. Cardiovascular system: S1 & S2 heard, RRR. No JVD, murmurs, rubs, gallops or clicks. No pedal edema. Gastrointestinal system: Abdomen is nondistended, soft and nontender. No organomegaly or masses felt. Normal bowel sounds heard. Central nervous system: Alert and oriented. No focal neurological deficits. Extremities:  No cyanosis, no clubbing, No edema. Skin: No rashes, lesions or ulcers Psychiatry: Judgement and insight appear normal. Mood & affect appropriate.     Data Reviewed: I have personally reviewed following labs and imaging studies  CBC: Recent Labs  Lab 09/18/19 1811 09/19/19 0341  WBC 6.8 6.0  NEUTROABS 3.7  --   HGB 12.6* 12.7*  HCT  34.8* 34.6*  MCV 96.7 94.3  PLT 103* 426*   Basic Metabolic Panel: Recent Labs  Lab 09/18/19 1811 09/19/19 0341 09/20/19 0737 09/20/19 1855 09/21/19 0214  NA 123* 125* 127* 127* 126*  K 3.5 4.0 4.6 4.2 4.2  CL 91* 94* 95* 96* 96*  CO2 22 18* 21* 20* 20*  GLUCOSE 101* 133* 107* 137* 95  BUN 6* 8 25* 34* 38*  CREATININE 0.80 0.79 2.71* 3.54* 4.01*  CALCIUM 7.9* 8.1* 8.2* 8.0* 8.0*   GFR: Estimated Creatinine Clearance: 20.7 mL/min (A) (by C-G formula based on SCr of 4.01 mg/dL (H)). Liver Function Tests: No results for input(s): AST, ALT, ALKPHOS, BILITOT, PROT, ALBUMIN in the last 168 hours. No results for input(s): LIPASE, AMYLASE in the last 168 hours. No results for input(s): AMMONIA in the last 168 hours. Coagulation Profile: Recent Labs  Lab 09/18/19 1811  INR 1.3*   Cardiac Enzymes: No results for input(s): CKTOTAL, CKMB, CKMBINDEX, TROPONINI in the last 168 hours. BNP (last 3 results) No results for input(s): PROBNP in the last 8760 hours. HbA1C: No results for input(s): HGBA1C in the last 72 hours. CBG: No results for input(s): GLUCAP in the last 168 hours. Lipid Profile: No results for input(s): CHOL, HDL, LDLCALC, TRIG, CHOLHDL, LDLDIRECT in the last 72 hours. Thyroid Function Tests: No results for input(s): TSH, T4TOTAL, FREET4, T3FREE, THYROIDAB in the last 72 hours. Anemia Panel: No results for input(s): VITAMINB12, FOLATE, FERRITIN, TIBC, IRON, RETICCTPCT in the last 72 hours. Sepsis Labs: No results for input(s): PROCALCITON, LATICACIDVEN in the last 168 hours.  Recent Results (from the past 240 hour(s))  SARS Coronavirus 2 by RT PCR (hospital order, performed in [facility] hospital lab) Nasopharyngeal Nasopharyngeal Swab     Status: None   Collection Time: 09/18/19  6:00 PM   Specimen: Nasopharyngeal Swab  Result Value Ref Range Status   SARS Coronavirus 2 NEGATIVE NEGATIVE Final    Comment: (NOTE) SARS-CoV-2 target nucleic acids are NOT  DETECTED.  The SARS-CoV-2 RNA is generally detectable in upper and lower respiratory specimens during the acute phase of infection. The lowest concentration of SARS-CoV-2 viral copies this assay can detect is 250 copies / mL. A negative result does not preclude SARS-CoV-2 infection and should not be used as the sole basis for treatment or other patient management decisions.  A negative result may occur with improper specimen collection / handling, submission of specimen other than nasopharyngeal swab, presence of viral mutation(s) within the areas targeted by this assay, and inadequate number of viral copies (<250 copies / mL). A negative result must be combined with clinical observations, patient history, and epidemiological information.  Fact Sheet for Patients:   StrictlyIdeas.no  Fact Sheet for Healthcare Providers: BankingDealers.co.za  This test is not yet approved or  cleared by the [place] FDA and has been authorized for detection and/or diagnosis of SARS-CoV-2 by FDA under an Emergency Use Authorization (EUA).  This EUA will remain in effect (meaning this test can be used) for the duration of the COVID-19 declaration under Section 564(b)(1) of the Act, 21 U.S.C. section 360bbb-3(b)(1), unless the authorization is terminated or revoked sooner.  Performed at [facility], [place] [address], [place], [place] [address]          Radiology Studies: MR CERVICAL SPINE WO CONTRAST  Result Date: 09/20/2019 CLINICAL DATA:  74 year old male with multiple falls. Left upper extremity weakness, muscle spasm. Myelopathy. EXAM: MRI CERVICAL SPINE WITHOUT CONTRAST TECHNIQUE: Multiplanar, multisequence MR imaging of the cervical spine was performed. No intravenous contrast was administered. COMPARISON:  Neck MRA 07/02/2019.  CT head and orbits 09/18/2019. FINDINGS: Alignment: Mild straightening of cervical lordosis. Mild  degenerative appearing retrolisthesis at from C4 on C5-C6 on C7. Vertebrae: No marrow edema or evidence of acute osseous abnormality. Degenerative endplate marrow signal changes. Cord: Capacious spinal canal at most levels. Cervical and visible upper thoracic spinal cord remain within normal limits. Posterior Fossa, vertebral arteries, paraspinal tissues: Cervicomedullary junction is within normal limits. Negative visible posterior fossa. Preserved major vascular flow voids in the neck. Negative visible neck soft tissues. Disc levels: C2-C3:  Negative. C3-C4: Disc space loss. Mostly foraminal disc bulge and endplate spurring. No spinal stenosis. Moderate bilateral C4 foraminal stenosis. C4-C5: Mild retrolisthesis and disc space loss. Circumferential disc osteophyte complex and mild posterior element hypertrophy. No significant spinal stenosis. Moderate to severe left and moderate right C5 foraminal stenosis. C5-C6: Mild retrolisthesis with disc space loss and circumferential disc osteophyte complex. Mild posterior element hypertrophy. No significant spinal stenosis. Moderate to severe bilateral C6 foraminal stenosis. C6-C7: Mild retrolisthesis. Disc space loss. Mild circumferential disc osteophyte complex. Mild posterior element hypertrophy. No spinal stenosis. Mild to moderate C7 foraminal stenosis greater on the right. C7-T1: Negative disc. Mild to moderate facet hypertrophy. No stenosis. No visible upper thoracic spinal stenosis. There is bilateral upper thoracic facet hypertrophy. 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 But capacious spinal canal with no significant spinal stenosis. Normal cervical spinal cord. 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 Electronically Signed   By: Genevie Ann M.D.   On: 09/20/2019 19:25   US RENAL  Result Date: 09/20/2019 CLINICAL DATA:  Acute renal injury. EXAM: RENAL / URINARY TRACT  ULTRASOUND COMPLETE COMPARISON:  None. FINDINGS: Right Kidney: Renal measurements: 10.4 cm x 4.4 cm x 5.1 cm = volume: 122.1 mL. Echogenicity within normal limits. No mass or hydronephrosis visualized. Left Kidney: Renal measurements: 11.4 cm x 6.6 cm x 5.8 cm = volume: 225.9 mL. Echogenicity within normal limits. No mass or hydronephrosis visualized. Bladder: Appears normal for degree of bladder distention. Other: None. IMPRESSION: Normal renal ultrasound. Electronically Signed   By: Virgina Norfolk M.D.   On: 09/20/2019 18:04    Scheduled Meds: . carbidopa-levodopa  0.5 tablet Oral  BID  . DULoxetine  60 mg Oral Daily  . finasteride  5 mg Oral Daily  . gatifloxacin  1 drop Left Eye QID  . loratadine  10 mg Oral Daily  . mirabegron ER  50 mg Oral Daily  . neomycin-polymyxin b-dexamethasone  1 application Left Eye QID  . prednisoLONE acetate  1 drop Left Eye QID   Continuous Infusions: . sodium chloride 50 mL/hr at 09/21/19 1613  . cefTAZidime (FORTAZ)  IV       LOS: 3 days    Time spent:  35 mins.    Shawna Clamp, MD [facility]   If 7PM-7AM, please contact night-coverage

## 2019-09-21 NOTE — Progress Notes (Signed)
PHARMACY NOTE:  ANTIMICROBIAL RENAL DOSAGE ADJUSTMENT  Current antimicrobial regimen includes a mismatch between antimicrobial dosage and estimated renal function.  As per policy approved by the Pharmacy & Therapeutics and Medical Executive Committees, the antimicrobial dosage will be adjusted accordingly.  Current antimicrobial dosage:  Ceftazidime 1g IV every 12 hours Indication: Wound infection (Ruptured globe of left eye, open globe/letter rupture with uveal prolapse/loss of uvular tissue, complete hyphema) Renal Function: Scr increased to 4   Estimated Creatinine Clearance: 20.7 mL/min (A) (by C-G formula based on SCr of 4.01 mg/dL (H)). []      On intermittent HD, scheduled: []      On CRRT    Antimicrobial dosage has been changed to:  Ceftazidime 1 gm IV every 24 hours.    Thank you for allowing pharmacy to be a part of this patient's care.  [employer], Pharm.D., BCPS Clinical Pharmacist [phone] Please check AMION for all [facility] phone numbers After 10:00 PM, call [place] [phone]  09/21/2019 7:29 AM

## 2019-09-21 NOTE — Progress Notes (Signed)
Inpatient Rehab Admissions Coordinator:   Met with patient at bedside to discuss potential CIR admission. Pt. Stated interest. His daughter was present and confirmed that Pt.'s wife can provide 24/7 support at discharge.  Will pursue for potential admit next week, pending bed availability.   Clemens Catholic, [place], [place] Admissions Coordinator  [phone] ([place]) [phone] (office)

## 2019-09-22 LAB — VANCOMYCIN, RANDOM: Vancomycin Rm: 23

## 2019-09-22 LAB — CBC
HCT: 33.4 % — ABNORMAL LOW (ref 39.0–52.0)
Hemoglobin: 11.7 g/dL — ABNORMAL LOW (ref 13.0–17.0)
MCH: 33.8 pg (ref 26.0–34.0)
MCHC: 35 g/dL (ref 30.0–36.0)
MCV: 96.5 fL (ref 80.0–100.0)
Platelets: 117 10*3/uL — ABNORMAL LOW (ref 150–400)
RBC: 3.46 MIL/uL — ABNORMAL LOW (ref 4.22–5.81)
RDW: 19 % — ABNORMAL HIGH (ref 11.5–15.5)
WBC: 7.1 10*3/uL (ref 4.0–10.5)
nRBC: 0 % (ref 0.0–0.2)

## 2019-09-22 LAB — TSH: TSH: 0.506 u[IU]/mL (ref 0.350–4.500)

## 2019-09-22 LAB — URINALYSIS, ROUTINE W REFLEX MICROSCOPIC
Bilirubin Urine: NEGATIVE
Glucose, UA: NEGATIVE mg/dL
Ketones, ur: NEGATIVE mg/dL
Leukocytes,Ua: NEGATIVE
Nitrite: NEGATIVE
Protein, ur: NEGATIVE mg/dL
Specific Gravity, Urine: 1.006 (ref 1.005–1.030)
pH: 5 (ref 5.0–8.0)

## 2019-09-22 LAB — CORTISOL-AM, BLOOD: Cortisol - AM: 3.3 ug/dL — ABNORMAL LOW (ref 6.7–22.6)

## 2019-09-22 LAB — BASIC METABOLIC PANEL
Anion gap: 9 (ref 5–15)
BUN: 53 mg/dL — ABNORMAL HIGH (ref 8–23)
CO2: 19 mmol/L — ABNORMAL LOW (ref 22–32)
Calcium: 8 mg/dL — ABNORMAL LOW (ref 8.9–10.3)
Chloride: 100 mmol/L (ref 98–111)
Creatinine, Ser: 4.4 mg/dL — ABNORMAL HIGH (ref 0.61–1.24)
GFR calc Af Amer: 14 mL/min — ABNORMAL LOW (ref >60)
GFR calc non Af Amer: 12 mL/min — ABNORMAL LOW (ref >60)
Glucose, Bld: 102 mg/dL — ABNORMAL HIGH (ref 70–99)
Potassium: 3.9 mmol/L (ref 3.5–5.1)
Sodium: 128 mmol/L — ABNORMAL LOW (ref 135–145)

## 2019-09-22 LAB — CREATININE, URINE, RANDOM: Creatinine, Urine: 83.97 mg/dL

## 2019-09-22 LAB — OSMOLALITY, URINE: Osmolality, Ur: 201 mOsm/kg — ABNORMAL LOW (ref 300–900)

## 2019-09-22 LAB — OSMOLALITY: Osmolality: 289 mOsm/kg (ref 275–295)

## 2019-09-22 LAB — PHOSPHORUS: Phosphorus: 6.2 mg/dL — ABNORMAL HIGH (ref 2.5–4.6)

## 2019-09-22 LAB — MAGNESIUM: Magnesium: 2.1 mg/dL (ref 1.7–2.4)

## 2019-09-22 LAB — SODIUM, URINE, RANDOM: Sodium, Ur: 20 mmol/L

## 2019-09-22 MED ORDER — HYDROCORTISONE NA SUCCINATE PF 100 MG IJ SOLR
100.0000 mg | Freq: Three times a day (TID) | INTRAMUSCULAR | Status: DC
  Administered 2019-09-22 – 2019-09-28 (×18): 100 mg via INTRAVENOUS
  Filled 2019-09-22 (×18): qty 2

## 2019-09-22 NOTE — PMR Pre-admission (Signed)
PMR Admission Coordinator Pre-Admission Assessment  Patient: Nicholas Ramirez is an 74 y.o., male MRN: [id number] DOB: [birth year] Height: 5' 9" (175.3 cm) Weight: 101.6 kg  Insurance Information HMO:     PPO:      PCP:      IPA:      80/20:      OTHER:  PRIMARY: [employer] Medicare      Policy#: [id number]      Subscriber: Pt.  CM Name: Marjory Lies      Phone#: [phone]      Fax#:   Pre-Cert#:    [id number]   Approval for 9/24 for 7 days.  Updates due on 10/03/19.  Employer:  Benefits:  Phone #: [phone]      Name: Eff. Date:  04/04/2019 - present     Deduct: $0 (does not have deductible)      Out of Pocket Max: $6,700 ($372.21 met)       Life Max: n/a CIR:$430/day co-pay for days 1-4, $0/day co-pay for days 6+   SNF: $0/day co-pay for days 1-20, $184/day co-pay for days Outpatient:$40/visit co-pay; limited by medical necessity Home Health: 100% coverage, 0% co-insurance; limited by medical necessity DME: 80%     Co-Pay: 20% Providers: in network  SECONDARY: n/a The "Data Collection Information Summary" for patients in Inpatient Rehabilitation Facilities with attached "Privacy Act [place] Records" was provided and verbally reviewed with: patient, family  Emergency Contact Information Contact Information    Name Relation Home Work [place] [phone]  [phone]   Letha Cape Daughter [phone]  [phone]      Current Medical History  Patient Admitting Diagnosis: Fall with injury to the eye History of Present Illness: Patient had multiple falls over the last few months at home. He has been evaluated at outpatient neurology (Dr Tomi Likens) and has had CT scan and MRI of his brain performed. He reported he has had generalized weakness with a slow unsteady gait. He was recently started on levodopa, carbidopa for possible Parkinson's disease by neurology.He fell on the dresser and has sustained a laceration to the side of his eye and hit his  eye. He was unable to see and EMS was called. He reports that he had normal vision with his glasses on in both eyes until this fall. He reports he cannot see out of the left eye after his fall. 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  Ophthalmology has been consulted and  He underwent surgical repair 9/16. Patient was given broad-spectrum antibiotics in the emergency room as well as a tetanus shot. Stay complicated by AKI and worsening renal function and nephrology was consulted.     Patient's medical record from [facility] has been reviewed by the rehabilitation admission coordinator and physician.  Past Medical History  Past Medical History:  Diagnosis Date  . Asthma   . Depression   . Heart murmur   . Hepatitis C   . Hypertension     Family History   family history is not on file.  Prior Rehab/Hospitalizations Has the patient had prior rehab or hospitalizations prior to admission? No  Has the patient had major surgery during 100 days prior to admission? Yes   Current Medications  Current Facility-Administered Medications:  .  carbidopa-levodopa (SINEMET IR) 25-100 MG per tablet immediate release 0.5  tablet, 0.5 tablet, Oral, BID, Rai, Ripudeep K, MD, 0.5 tablet at 09/25/19 0943 .  Chlorhexidine Gluconate Cloth 2 % PADS 6 each, 6 each, Topical, Daily, Shawna Clamp, MD, 6 each at 09/25/19 [phone] .  doxycycline (VIBRA-TABS) tablet 100 mg, 100 mg, Oral, Q12H, Lonia Skinner, MD, 100 mg at 09/25/19 0943 .  DULoxetine (CYMBALTA) DR capsule 60 mg, 60 mg, Oral, Daily, Rai, Ripudeep K, MD, 60 mg at 09/25/19 0942 .  finasteride (PROSCAR) tablet 5 mg, 5 mg, Oral, Daily, Rai, Ripudeep K, MD, 5 mg at 09/25/19 0944 .  gatifloxacin (ZYMAXID) 0.5 % ophthalmic drops 1 drop, 1 drop, Left Eye, QID, Lonia Skinner, MD, 1  drop at 09/25/19 1429 .  hydrALAZINE (APRESOLINE) injection 10 mg, 10 mg, Intravenous, Q6H PRN, Vernelle Emerald, MD, 10 mg at 09/19/19 0307 .  HYDROcodone-acetaminophen (NORCO/VICODIN) 5-325 MG per tablet 1-2 tablet, 1-2 tablet, Oral, Q4H PRN, Shawna Clamp, MD, 2 tablet at 09/23/19 0500 .  hydrocortisone sodium succinate (SOLU-CORTEF) 100 MG injection 100 mg, 100 mg, Intravenous, Q8H, Justin Mend, MD, 100 mg at 09/25/19 1147 .  HYDROmorphone (DILAUDID) injection 1 mg, 1 mg, Intravenous, Q3H PRN, Chotiner, Yevonne Aline, MD, 1 mg at 09/25/19 1434 .  levofloxacin (LEVAQUIN) tablet 500 mg, 500 mg, Oral, Q48H, Lonia Skinner, MD, 500 mg at 09/25/19 0942 .  loratadine (CLARITIN) tablet 10 mg, 10 mg, Oral, Daily, Rai, Ripudeep K, MD, 10 mg at 09/25/19 0944 .  mirabegron ER (MYRBETRIQ) tablet 50 mg, 50 mg, Oral, Daily, Rai, Ripudeep K, MD, 50 mg at 09/25/19 0942 .  neomycin-polymyxin b-dexamethasone (MAXITROL) ophthalmic ointment 1 application, 1 application, Left Eye, QID, Lonia Skinner, MD, 1 application at 81/01/75 1429 .  prednisoLONE acetate (PRED FORTE) 1 % ophthalmic suspension 1 drop, 1 drop, Left Eye, QID, Lonia Skinner, MD, 1 drop at 09/25/19 1429 .  promethazine (PHENERGAN) tablet 12.5 mg, 12.5 mg, Oral, Q6H PRN, Chotiner, Yevonne Aline, MD .  senna-docusate (Senokot-S) tablet 1 tablet, 1 tablet, Oral, BID, Shawna Clamp, MD, 1 tablet at 09/25/19 0944  Patients Current Diet:  Diet Order            Diet Heart Room service appropriate? Yes; Fluid consistency: Thin  Diet effective now                 Precautions / Restrictions Precautions Precautions: Fall Precaution Comments: pt admitted for a fall, daughter reports 15-20 in last 6 months Restrictions Weight Bearing Restrictions: No   Has the patient had 2 or more falls or a fall with injury in the past year? Yes  Prior Activity Level Limited Community (1-2x/wk): Pt. went out only occasionally for appointments  Prior  Functional Level Self Care: Did the patient need help bathing, dressing, using the toilet or eating? Needed some help  Indoor Mobility: Did the patient need assistance with walking from room to room (with or without device)? Independent  Stairs: Did the patient need assistance with internal or external stairs (with or without device)? Dependent  Functional Cognition: Did the patient need help planning regular tasks such as shopping or remembering to take medications? Needed some help  Home Assistive Devices / [place] Devices/Equipment: Gilford Rile (specify type), Wheelchair Home Equipment: Hand held shower head, Shower seat  Prior Device Use: Indicate devices/aids used by the patient prior to current illness, exacerbation or injury? None of the above  Current Functional Level Cognition  Overall Cognitive Status: Impaired/Different from baseline Current Attention Level: Selective  Orientation Level: Oriented X4 Following Commands: Follows multi-step commands inconsistently, Follows one step commands with increased time Safety/Judgement: Decreased awareness of safety, Decreased awareness of deficits General Comments: pt with improved awareness today, able to state "I know when I am leaning". Continues to require cues for problem solving basic mobility/ADL tasks.    Extremity Assessment (includes Sensation/Coordination)  Upper Extremity Assessment: LUE deficits/detail, Generalized weakness LUE Deficits / Details: hx of shoudler surgery 50 yrs ago and carpal tunnel surgery 2 years ago; presenting with increased tone and limited AROM/PROM at shoulder, eblow, wrist and hand (family reports pt not functionally using since carpal tunnel surgery) LUE Coordination: decreased fine motor, decreased gross motor  Lower Extremity Assessment: Defer to PT evaluation    ADLs  Overall ADL's : Needs assistance/impaired Grooming: Maximal assistance, Sitting Upper Body Bathing: Maximal  assistance, Sitting Lower Body Bathing: Total assistance, +2 for physical assistance, +2 for safety/equipment, Sit to/from stand Upper Body Dressing : Maximal assistance, Sitting Lower Body Dressing: Total assistance, +2 for physical assistance, +2 for safety/equipment, Sit to/from stand Toilet Transfer: Minimal assistance, +2 for physical assistance, +2 for safety/equipment Toilet Transfer Details (indicate cue type and reason): with use of stedy. Pt able to rise into standing with multimodal cues for appropriate posture (tendency to assume L lateral lean) Functional mobility during ADLs: Minimal assistance, +2 for physical assistance, +2 for safety/equipment (with use of stedy) General ADL Comments: pt able to progress in ADL mobility with use of stedy for OOB activities.    Mobility  Overal bed mobility: Needs Assistance Bed Mobility: Supine to Sit, Sit to Supine Supine to sit: Mod assist, +2 for physical assistance, +2 for safety/equipment Sit to supine: Max assist, +2 for physical assistance, +2 for safety/equipment General bed mobility comments: performed transfer to strong R side. increased time and cueing needed to utilize rails. assist for BLE translation to EOB with additional truncal support. Increased assist of max A +2 back to bed. Pt with difficulty controlling trunk with return to supine position    Transfers  Overall transfer level: Needs assistance Equipment used: Ambulation equipment used Transfer via Lift Equipment: Stedy Transfers: Sit to/from Stand Sit to Stand: Min assist, +2 physical assistance, +2 safety/equipment General transfer comment: assist to power into standing in stedy frame. Increased assist/facilitation used on L side due to LUE weakness. Multimodal cues used for appropriate posture    Ambulation / Gait / Stairs / Wheelchair Mobility  Ambulation/Gait Ambulation/Gait assistance: Mod assist, +2 safety/equipment Gait Distance (Feet): 8 Feet Assistive  device: 2 person hand held assist, 1 person hand held assist Gait Pattern/deviations: Step-to pattern, Shuffle, Wide base of support General Gait Details: Slow, unsteady gait with wide BoS, difficulty advancing LLE, assist with weight shifting to advance LEs, posterior lean. Gait velocity: decreased Gait velocity interpretation: <1.31 ft/sec, indicative of household ambulator    Posture / Balance Dynamic Sitting Balance Sitting balance - Comments: fluctates due to posterior lean, min-mod assist but preference to at least 1 UE support Balance Overall balance assessment: Needs assistance Sitting-balance support: Feet supported, No upper extremity supported Sitting balance-Leahy Scale: Fair Sitting balance - Comments: fluctates due to posterior lean, min-mod assist but preference to at least 1 UE support Postural control: Posterior lean Standing balance support: During functional activity Standing balance-Leahy Scale: Poor Standing balance comment: relies on external support due to posterior lean    Special needs/care consideration  Skin Surgical incision: Left eye; Excoriated: leg; right/left/lower; Eccymosis: arm, hip, leg;; bilateral; Abrasion: flank; right; Blister:  leg; right/lateral; Cracking: foot/leg; bilateral and Designated Visitor Khaalid Lefkowitz, wife   Previous Home Environment (from acute therapy documentation) Living Arrangements: Spouse/significant other Available Help at Discharge: Available 24 hours/day (granddaughers come to visit and assist as needed) Type of Home: House Home Layout: One level Home Access: Level entry Bathroom Shower/Tub: Chiropodist: Bangs: No Additional Comments: no AD or grab bars. pt unable to grasp with LUE  Discharge Living Setting Plans for Discharge Living Setting: Patient's home Type of Home at Discharge: House Discharge Home Layout: One level Discharge Home Access: Level entry Discharge Bathroom  Shower/Tub: Tub/shower unit Discharge Bathroom Toilet: Standard Discharge Bathroom Accessibility: Yes How Accessible: Accessible via walker Does the patient have any problems obtaining your medications?: No  Social/Family/Support Systems Patient Roles: Partner Contact Information: [phone] Anticipated Caregiver: Kendrick Haapala  Anticipated Caregiver's Contact Information: [phone] Ability/Limitations of Caregiver: Can provide mod A Caregiver Availability: 24/7 Discharge Plan Discussed with Primary Caregiver: Yes Is Caregiver In Agreement with Plan?: Yes Does Caregiver/Family have Issues with Lodging/Transportation while Pt is in Rehab?: No  Goals Patient/Family Goal for Rehab: PT/OT Mod A Expected length of stay: 17-21 days  Pt/Family Agrees to Admission and willing to participate: Yes Program Orientation Provided & Reviewed with Pt/Caregiver Including Roles  & Responsibilities: Yes  Decrease burden of Care through IP rehab admission:  NA  Possible need for SNF placement upon discharge: not anticipated  Patient Condition: I have reviewed medical records from [facility], spoken with CM, and patient and spouse. I met with patient at the bedside and discussed via phone with wife for inpatient rehabilitation assessment.  Patient will benefit from ongoing PT, ST and OT, can actively participate in 3 hours of therapy a day 5 days of the week, and can make measurable gains during the admission.  Patient will also benefit from the coordinated team approach during an Inpatient Acute Rehabilitation admission.  The patient will receive intensive therapy as well as Rehabilitation physician, nursing, social worker, and care management interventions.  Due to bladder management, safety, skin/wound care, disease management, medication administration, pain management and patient education the patient requires 24 hour a day rehabilitation nursing.  The patient is currently Min A  +2 with transfers and Mod A +2 with mobility and Max A with basic ADLs.  Discharge setting and therapy post discharge at home with home health is anticipated.  Patient has agreed to participate in the Acute Inpatient Rehabilitation Program and will admit today.  Preadmission Screen Completed By:  Genella Mech, 09/25/2019 2:35 PM ______________________________________________________________________   Discussed status with Dr. Ella Bodo on 09/30/19 at 4:59 PM  and received approval for admission today.  Admission Coordinator:  Genella Mech, CCC-SLP, time 4:59 PM Sudie Grumbling 09/30/19    Assessment/Plan: Diagnosis: Debility after fall 1. Does the need for close, 24 hr/day Medical supervision in concert with the patient's rehab needs make it unreasonable for this patient to be served in a less intensive setting? Yes 2. Co-Morbidities requiring supervision/potential complications: Parkinson's disease, Left eye hyphema with visual loss 3. Due to bladder management, bowel management, safety, skin/wound care, disease management, medication administration, pain management and patient education, does the patient require 24 hr/day rehab nursing? Yes 4. Does the patient require coordinated care of a physician, rehab nurse, PT, OT, and SLP to address physical and functional deficits in the context of the above medical diagnosis(es)? Yes Addressing deficits in the following areas: balance, endurance, locomotion, strength, transferring, bowel/bladder control, bathing,  dressing, feeding, grooming, toileting, cognition, speech, language, swallowing and psychosocial support 5. Can the patient actively participate in an intensive therapy program of at least 3 hrs of therapy 5 days a week? Yes 6. The potential for patient to make measurable gains while on inpatient rehab is fair 7. Anticipated functional outcomes upon discharge from inpatient rehab: mod assist PT, mod assist OT, min assist SLP 8. Estimated rehab length  of stay to reach the above functional goals is: 14-18d 9. Anticipated discharge destination: Home 10. Overall Rehab/Functional Prognosis: good   MD Signature: Charlett Blake M.D. [facility] FAAPM&R (Neuromuscular Med) Diplomate Am Board of Electrodiagnostic Med Fellow Am Board of Interventional Pain

## 2019-09-22 NOTE — Progress Notes (Signed)
Inpatient Rehab Admissions Coordinator:   I have spoken with Pt.'s wife who states that she is interested in [place] placement for patient as well. She reports a recent decline in function and frequent falls over the past several months. I will request insurance authorization for this patient and pursue for admission pending insurance auth and bed availability.   Clemens Catholic, [place], [place] Admissions Coordinator  [phone] ([place]) [phone] (office)

## 2019-09-22 NOTE — Plan of Care (Signed)

## 2019-09-22 NOTE — Progress Notes (Signed)
Pharmacy Antibiotic Note  Adelbert Gaspard is a 74 y.o. male on day# 5 of broad spectrum antibiotics for ruptured globe left eye s/p fall PTA.  Vancomycin has been held since 9/17 with development of AKI.  Vancomycin random level this AM = 23.  Calculated patient specific Vancomycin clearance (ke = 0.0115) and estimate Vancomycin level ~ 15 on 9/20 PM.  Reached out to Dr. Dwyane Dee to see if MRSA coverage was still desired and to formulate a plan for when Vancomycin clears the patient's system.  For now, MD requesting Daptomycin.  Noted Daptomycin required ID physician approval.  Since a dose is not needed until 9/20 PM, will defer for ID and pharmacy team to follow-up in AM.  Plan: No further doses of Vancomycin scheduled.  Vancomycin consult discontinued. Continues on Ceftazidine 1gm IV q24h. Follow-up on plans for Daptomycin. Follow-up opthalmology recommendations for abx selection and LOT.   Height: 5\' 9"  (175.3 cm) Weight: 108.9 kg (240 lb) (bed scale) IBW/kg (Calculated) : 70.7  Temp (24hrs), Avg:98.2 F (36.8 C), Min:97.4 F (36.3 C), Max:98.8 F (37.1 C)  Recent Labs  Lab 09/18/19 1811 09/18/19 1811 09/19/19 0341 09/20/19 0737 09/20/19 1855 09/21/19 0214 09/22/19 0736  WBC 6.8  --  6.0  --   --   --  7.1  CREATININE 0.80   < > 0.79 2.71* 3.54* 4.01* 4.40*  VANCOTROUGH  --   --   --   --  35*  --   --   VANCORANDOM  --   --   --   --   --   --  23   < > = values in this interval not displayed.    Estimated Creatinine Clearance: 18.2 mL/min (A) (by C-G formula based on SCr of 4.4 mg/dL (H)).    Allergies  Allergen Reactions  . Oxybutynin Other (See Comments)    cramps  . Sertraline Itching  . Tadalafil Rash    Antimicrobials this admission:  Vancomycin 9/15 >> 9/17 (hold for AKI)  Ceftazidime 9/15 >>  Dose adjustments this admission:  9/17: VT = 35 mcg/ml - on 1gm IV q12h - now on hold  9/19: VR 23 (36 hours from last level, pt specific ke =  0.0115)  Microbiology results:  9/15 COVID: negative   Thank you for allowing pharmacy to be a part of this patient's care.  [employer], Pharm.D., BCPS Clinical Pharmacist Clinical phone for 09/22/2019 from 8:30-4:00 is [phone].  **Pharmacist phone directory can be found on [email or web address] listed under [place].  09/22/2019 1:12 PM

## 2019-09-22 NOTE — Progress Notes (Signed)
PROGRESS NOTE    Nicholas Ramirez  KGM:[id number] DOB: Mar 06, 1945 DOA: 09/18/2019 PCP: Townsend Roger, MD  Brief Narrative:  Patient is a 74 year old male with HTN, heart murmur, cataracts, hepatitis C presented to [facility] a fall at home.  Patient had multiple falls over the last few months at home. He has been evaluated at outpatient neurology (Dr Tomi Likens) and has had CT scan and MRI of his brain performed. He reported he has had generalized weakness with a slow unsteady gait. He was recently started on levodopa, carbidopa for possible Parkinson's disease by neurology.He fell on the dresser and has sustained a laceration to the side of his eye and hit his eye. He was unable to see and EMS was called. He reports that he had normal vision with his glasses in both eyes until this fall. He reports he cannot see out of the left eye after his fall. He was found to have hyphema and possible hematoma in left eye on CT scan.He denies any loss of consciousness with the fall. CT scan of his orbits shows probable rupture of anterior left globe. Ophthalmology has been consulted and  He underwent surgical repair 9/16. Patient was given broad-spectrum antibiotics in the emergency room as well as a tetanus shot. Renal functions worsening, Nephrology consulted. Vancomycin discontinued, continued on supportive care, No need for hemodialysis at this time.   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 -Patient was evaluated by ophthalmology, status post surgical repair on 9/16 am. -Ophthalmology following, appreciate Dr. Roda Shutters recommendations, visual prognosis for the eye is extremely guarded, eye  shield at all times -Per recommendations, continue broad-spectrum IV antibiotics and transitioning to p.o. broad-spectrum on discharge, eyedrops, follow-up with ophthalmology outpatient on Monday.   Active Problems: Accelerated hypertension -BP on admission elevated however been running soft , will hold off on lisinopril, and amlodipine.   - Hold propranolol.  Acute kidney injury, acute urinary retention: -Patient did have an episode of acute urinary retention , has history of BPH -Creatinine 2.71-> 4.1 , held vancomycin, lisinopril, avoid hypotension. -Placed on IV fluid hydration, renal ultrasound ruled out any obstruction or hydronephrosis -Resume finasteride, mirabegron ER  -Recheck BMP showed worsening renal functions, Nephrology consulted. -Patient may require foley catheter if he continues to have urinary retention. -No indication for Hemodialysis at this time, continue supportive care. - Gentle hydration for 12 hrs. Started on stress dose of steroids.  Mechanical fall, recurrent falls at home, recent diagnosis of Parkinson's -PT OT evaluation recommended CIR, consult placed -Reviewed recent neurology note by Dr. 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 - started Sinemet, discussed with the patient and daughter.   Hyponatremia -Serum osmolality 275, urine osmolality 41, urine sodium 108, SIADH component  -IV fluids were held 9/17. Nephrology consulted, recommended gentle hydration x 12 hrs. -Sodium improved to 128 today with gentle hydration.   Prolonged QT interval Avoid QT prolonging medications -Monitor K, magnesium   Obesity Estimated body mass index is 38.19 kg/m as calculated from the following:  Height as of this encounter: 5\' 9"  (1.753 m).   Weight  as of this encounter: 117.3 kg.   DVT prophylaxis:SCDs. Code Status: Full Family Communication: Spoke with daughter at bed side. Disposition Plan:  Status is: Inpatient  Remains inpatient appropriate because:Inpatient level of care appropriate due to severity of illness   Dispo: The patient is from: Home              Anticipated d/c is to: CIR              Anticipated d/c date is: 3 days              Patient currently is not medically stable to d/c.   Consultants:   Ophthalmology, nephrology  Procedures: Antimicrobials:  Anti-infectives (From admission, onward)   Start     Dose/Rate Route Frequency Ordered Stop   09/21/19 2200  cefTAZidime (FORTAZ) 1 g in sodium chloride 0.9 % 100 mL IVPB       Note to Pharmacy: Ruptured left eye globe after fall at home and hit head on furniture   1 g 200 mL/hr over 30 Minutes Intravenous Every 24 hours 09/21/19 0731     09/20/19 2200  cefTAZidime (FORTAZ) 1 g in sodium chloride 0.9 % 100 mL IVPB  Status:  Discontinued       Note to Pharmacy: Ruptured left eye globe after fall at home and hit head on furniture   1 g 200 mL/hr over 30 Minutes Intravenous Every 12 hours 09/20/19 1255 09/21/19 0731   09/20/19 1231  vancomycin variable dose per unstable renal function (pharmacist dosing)  Status:  Discontinued         Does not apply See admin instructions 09/20/19 1231 09/20/19 1242   09/19/19 0800  vancomycin (VANCOCIN) IVPB 1000 mg/200 mL premix  Status:  Discontinued        1,000 mg 200 mL/hr over 60 Minutes Intravenous Every 12 hours 09/18/19 1932 09/20/19 1231   09/19/19 0300  cefTAZidime (FORTAZ) 1 g in sodium chloride 0.9 % 100 mL IVPB  Status:  Discontinued       Note to Pharmacy: Ruptured left eye globe after fall at home and hit head on furniture   1 g 200 mL/hr over 30 Minutes Intravenous Every 8 hours 09/19/19 0138 09/20/19 1255   09/18/19 2304  polymyxin 500,000 units/ceftazidime 1 g ophthalmic irrigation  Status:  Discontinued           As needed 09/18/19 2304 09/19/19 0044   09/18/19 2000  vancomycin (VANCOCIN) 2,500 mg in sodium chloride 0.9 % 500 mL IVPB        2,500 mg 250 mL/hr over 120 Minutes Intravenous  Once 09/18/19 1903 09/18/19 2243   09/18/19 1845  cefTAZidime (FORTAZ) 2 g in sodium chloride 0.9 % 100 mL IVPB        2 g 200 mL/hr over 30 Minutes Intravenous  Once 09/18/19 1809 09/18/19 1916      Subjective: Patient was seen and examined at bedside.  Left eye covered with shield.   He reports pain is better controlled, denies any overnight events.   Objective: Vitals:   09/21/19 2042 09/22/19 0454 09/22/19 0500 09/22/19 0953  BP: 100/63 115/66  103/69  Pulse: 80 74  76  Resp: 18 14  16   Temp: 98.7 F (37.1 C) (!) 97.4 F (36.3 C)  97.7 F (36.5 C)  TempSrc: Oral Oral  Oral  SpO2: 95% 95%  95%  Weight:  108.9 kg   Height:        Intake/Output Summary (Last 24 hours) at 09/22/2019 1452 Last data filed at 09/22/2019 1000 Gross per 24 hour  Intake 1375.9 ml  Output 1250 ml  Net 125.9 ml   Filed Weights   09/19/19 2119 09/20/19 2127 09/22/19 0500  Weight: 117.3 kg 117.3 kg 108.9 kg    Examination:  General exam: Appears calm and comfortable , Left eye covered with shield. Respiratory system: Clear to auscultation. Respiratory effort normal. Cardiovascular system: S1 & S2 heard, RRR. No JVD, murmurs, rubs, gallops or clicks. No pedal edema. Gastrointestinal system: Abdomen is nondistended, soft and nontender. No organomegaly or masses felt.  Normal bowel sounds heard. Central nervous system: Alert and oriented. No focal neurological deficits. Extremities:  No cyanosis, no clubbing,  Pitting edema+ noted Skin: No rashes, lesions or ulcers Psychiatry: Judgement and insight appear normal. Mood & affect appropriate.     Data Reviewed: I have personally reviewed following labs and imaging studies  CBC: Recent Labs  Lab 09/18/19 1811 09/19/19 0341 09/22/19 0736  WBC 6.8 6.0 7.1    NEUTROABS 3.7  --   --   HGB 12.6* 12.7* 11.7*  HCT 34.8* 34.6* 33.4*  MCV 96.7 94.3 96.5  PLT 103* 115* 784*   Basic Metabolic Panel: Recent Labs  Lab 09/19/19 0341 09/20/19 0737 09/20/19 1855 09/21/19 0214 09/22/19 0736  NA 125* 127* 127* 126* 128*  K 4.0 4.6 4.2 4.2 3.9  CL 94* 95* 96* 96* 100  CO2 18* 21* 20* 20* 19*  GLUCOSE 133* 107* 137* 95 102*  BUN 8 25* 34* 38* 53*  CREATININE 0.79 2.71* 3.54* 4.01* 4.40*  CALCIUM 8.1* 8.2* 8.0* 8.0* 8.0*  MG  --   --   --   --  2.1  PHOS  --   --   --   --  6.2*   GFR: Estimated Creatinine Clearance: 18.2 mL/min (A) (by C-G formula based on SCr of 4.4 mg/dL (H)). Liver Function Tests: No results for input(s): AST, ALT, ALKPHOS, BILITOT, PROT, ALBUMIN in the last 168 hours. No results for input(s): LIPASE, AMYLASE in the last 168 hours. No results for input(s): AMMONIA in the last 168 hours. Coagulation Profile: Recent Labs  Lab 09/18/19 1811  INR 1.3*   Cardiac Enzymes: No results for input(s): CKTOTAL, CKMB, CKMBINDEX, TROPONINI in the last 168 hours. BNP (last 3 results) No results for input(s): PROBNP in the last 8760 hours. HbA1C: No results for input(s): HGBA1C in the last 72 hours. CBG: No results for input(s): GLUCAP in the last 168 hours. Lipid Profile: No results for input(s): CHOL, HDL, LDLCALC, TRIG, CHOLHDL, LDLDIRECT in the last 72 hours. Thyroid Function Tests: Recent Labs    09/22/19 0736  TSH 0.506   Anemia Panel: No results for input(s): VITAMINB12, FOLATE, FERRITIN, TIBC, IRON, RETICCTPCT in the last 72 hours. Sepsis Labs: No results for input(s): PROCALCITON, LATICACIDVEN in the last 168 hours.  Recent Results (from the past 240 hour(s))  SARS Coronavirus 2 by RT PCR (hospital order, performed in [facility] hospital lab) Nasopharyngeal Nasopharyngeal Swab     Status: None   Collection Time: 09/18/19  6:00 PM   Specimen: Nasopharyngeal Swab  Result Value Ref Range Status   SARS Coronavirus  2 NEGATIVE NEGATIVE Final    Comment: (NOTE) SARS-CoV-2 target nucleic acids are NOT DETECTED.  The SARS-CoV-2 RNA is generally detectable in upper and lower respiratory specimens during the acute phase of  infection. The lowest concentration of SARS-CoV-2 viral copies this assay can detect is 250 copies / mL. A negative result does not preclude SARS-CoV-2 infection and should not be used as the sole basis for treatment or other patient management decisions.  A negative result may occur with improper specimen collection / handling, submission of specimen other than nasopharyngeal swab, presence of viral mutation(s) within the areas targeted by this assay, and inadequate number of viral copies (<250 copies / mL). A negative result must be combined with clinical observations, patient history, and epidemiological information.  Fact Sheet for Patients:   StrictlyIdeas.no  Fact Sheet for Healthcare Providers: BankingDealers.co.za  This test is not yet approved or  cleared by the [place] FDA and has been authorized for detection and/or diagnosis of SARS-CoV-2 by FDA under an Emergency Use Authorization (EUA).  This EUA will remain in effect (meaning this test can be used) for the duration of the COVID-19 declaration under Section 564(b)(1) of the Act, 21 U.S.C. section 360bbb-3(b)(1), unless the authorization is terminated or revoked sooner.  Performed at [facility], [place] [address], [place], [place] [address]          Radiology Studies: MR CERVICAL SPINE WO CONTRAST  Result Date: 09/20/2019 CLINICAL DATA:  74 year old male with multiple falls. Left upper extremity weakness, muscle spasm. Myelopathy. EXAM: MRI CERVICAL SPINE WITHOUT CONTRAST TECHNIQUE: Multiplanar, multisequence MR imaging of the cervical spine was performed. No intravenous contrast was administered. COMPARISON:  Neck MRA 07/02/2019.  CT head and orbits  09/18/2019. FINDINGS: Alignment: Mild straightening of cervical lordosis. Mild degenerative appearing retrolisthesis at from C4 on C5-C6 on C7. Vertebrae: No marrow edema or evidence of acute osseous abnormality. Degenerative endplate marrow signal changes. Cord: Capacious spinal canal at most levels. Cervical and visible upper thoracic spinal cord remain within normal limits. Posterior Fossa, vertebral arteries, paraspinal tissues: Cervicomedullary junction is within normal limits. Negative visible posterior fossa. Preserved major vascular flow voids in the neck. Negative visible neck soft tissues. Disc levels: C2-C3:  Negative. C3-C4: Disc space loss. Mostly foraminal disc bulge and endplate spurring. No spinal stenosis. Moderate bilateral C4 foraminal stenosis. C4-C5: Mild retrolisthesis and disc space loss. Circumferential disc osteophyte complex and mild posterior element hypertrophy. No significant spinal stenosis. Moderate to severe left and moderate right C5 foraminal stenosis. C5-C6: Mild retrolisthesis with disc space loss and circumferential disc osteophyte complex. Mild posterior element hypertrophy. No significant spinal stenosis. Moderate to severe bilateral C6 foraminal stenosis. C6-C7: Mild retrolisthesis. Disc space loss. Mild circumferential disc osteophyte complex. Mild posterior element hypertrophy. No spinal stenosis. Mild to moderate C7 foraminal stenosis greater on the right. C7-T1: Negative disc. Mild to moderate facet hypertrophy. No stenosis. No visible upper thoracic spinal stenosis. There is bilateral upper thoracic facet hypertrophy. 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 But capacious spinal canal with no significant spinal stenosis. Normal cervical spinal cord. 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 Electronically Signed   By: Genevie Ann M.D.   On: 09/20/2019 19:25   US RENAL  Result  Date: 09/20/2019 CLINICAL DATA:  Acute renal injury. EXAM: RENAL / URINARY TRACT ULTRASOUND COMPLETE COMPARISON:  None. FINDINGS: Right Kidney: Renal measurements: 10.4 cm x 4.4 cm x 5.1 cm = volume: 122.1 mL. Echogenicity within normal limits. No mass or hydronephrosis visualized. Left Kidney: Renal measurements: 11.4 cm x 6.6 cm x 5.8 cm = volume: 225.9 mL. Echogenicity within normal limits. No mass  or hydronephrosis visualized. Bladder: Appears normal for degree of bladder distention. Other: None. IMPRESSION: Normal renal ultrasound. Electronically Signed   By: Virgina Norfolk M.D.   On: 09/20/2019 18:04    Scheduled Meds: . carbidopa-levodopa  0.5 tablet Oral BID  . DULoxetine  60 mg Oral Daily  . finasteride  5 mg Oral Daily  . gatifloxacin  1 drop Left Eye QID  . hydrocortisone sod succinate (SOLU-CORTEF) inj  100 mg Intravenous Q8H  . loratadine  10 mg Oral Daily  . mirabegron ER  50 mg Oral Daily  . neomycin-polymyxin b-dexamethasone  1 application Left Eye QID  . prednisoLONE acetate  1 drop Left Eye QID   Continuous Infusions: . cefTAZidime (FORTAZ)  IV Stopped (09/21/19 2150)     LOS: 4 days    Time spent:  25 mins.    Shawna Clamp, MD [facility]   If 7PM-7AM, please contact night-coverage

## 2019-09-22 NOTE — Progress Notes (Signed)
[name] Progress Note   Subjective:   Seems to be feeling a bit better - eating breakfast with aid of daughter.  AM cortisol ~3, starting stress dose steroids.  I/Os yest:  1.3 / 1.7L all UOP - has condom cath  Background from initial c/s 09/21/19: Nicholas Ramirez is an 74 y.o. male with HTN, HCV, depression who presented 09/18/19 after fall at home; nephrology is consulted for AKI.   Presents on background of several falls over months, possible Parkisons. He fell day of admission, trauma, loss of vision L eye.  Imaging with ruptured globe.  Went to OR PM of 9/15 - general anesthesia, Maps initially 110, then 70s throughout case; no frank hypotension.   On presentation Cr 0.8 9/15 > 9.16 0.8 > 9/17 2.7 > 3.54 > 9/18 4.01.  Serum sodium initially 126 and has trended to 127 >  126 during admission;  9/16 urine osm 481, urine sodium 108.  Serum osm 275.  HCTZ was d/c'd 09/22/19 Renal US R 10.4, L 11.4, normal echogenicity, no retention.  Since the afternoon of 9/16 BPs have been 80-110s/60-70s. Amlodipine 10mg  9/17, lisinopril 9/16 and 9/17, inderal d/c'd afternoon 9/17.  No NSAIDs.  He rec'd IV hydration yesterday. I/Os show UOP 587mL yesterday, no UOP documented today. Per notes had urinary retention yesterday AM.  Rec'd ceftaz, vanc 2.5g loading and 1q12h until PM of 9/17- level 35 on 9/17.   Home meds include HCTZ, lisinopril 40, inderal 40 BID, mybetriq 40 daily, sinemet.   Daughter (doctoral nursing candidate to grad 05/2020) at bedside today -- feels he's sleepy from pain meds and not eating and drinking much.  Notes LE edema for several months.  Never had before.   LUE weak and atrophying since carpal tunnel sx 01/2018.   Objective Vitals:   09/21/19 1702 09/21/19 2042 09/22/19 0454 09/22/19 0500  BP: (!) 102/55 100/63 115/66   Pulse: 75 80 74   Resp: 18 18 14    Temp: 98.8 F (37.1 C) 98.7 F (37.1 C) (!) 97.4 F (36.3 C)   TempSrc:  Oral Oral   SpO2:  95% 95% 95%   Weight:    108.9 kg  Height:       Physical Exam Gen: obese man eating breakfast, looks better Eyes: anicteric, L with shield over  ENT: MM moist Neck: supple, no JVD CV:  RRR, II/VI SEM Abd:  Soft, obese Lungs: clear to bases, normal WOB GU: no foley, has condom cath with amber urine in cannister Extr:  1+ pitting LE edema,trace on LUE with L wrist brace Neuro: oriented but sleepy, speaks slowly Skin:a few scattered ecchymoese no rashes  Additional Objective Labs: Basic Metabolic Panel: Recent Labs  Lab 09/20/19 0737 09/20/19 1855 09/21/19 0214  NA 127* 127* 126*  K 4.6 4.2 4.2  CL 95* 96* 96*  CO2 21* 20* 20*  GLUCOSE 107* 137* 95  BUN 25* 34* 38*  CREATININE 2.71* 3.54* 4.01*  CALCIUM 8.2* 8.0* 8.0*   Liver Function Tests: No results for input(s): AST, ALT, ALKPHOS, BILITOT, PROT, ALBUMIN in the last 168 hours. No results for input(s): LIPASE, AMYLASE in the last 168 hours. CBC: Recent Labs  Lab 09/18/19 1811 09/19/19 0341  WBC 6.8 6.0  NEUTROABS 3.7  --   HGB 12.6* 12.7*  HCT 34.8* 34.6*  MCV 96.7 94.3  PLT 103* 115*   Blood Culture No results found for: SDES, SPECREQUEST, CULT, REPTSTATUS  Cardiac Enzymes: No results for input(s): CKTOTAL, CKMB,  CKMBINDEX, TROPONINI in the last 168 hours. CBG: No results for input(s): GLUCAP in the last 168 hours. Iron Studies: No results for input(s): IRON, TIBC, TRANSFERRIN, FERRITIN in the last 72 hours. @lablastinr3 @ Studies/Results: MR CERVICAL SPINE WO CONTRAST  Result Date: 09/20/2019 CLINICAL DATA:  74 year old male with multiple falls. Left upper extremity weakness, muscle spasm. Myelopathy. EXAM: MRI CERVICAL SPINE WITHOUT CONTRAST TECHNIQUE: Multiplanar, multisequence MR imaging of the cervical spine was performed. No intravenous contrast was administered. COMPARISON:  Neck MRA 07/02/2019.  CT head and orbits 09/18/2019. FINDINGS: Alignment: Mild straightening of cervical lordosis. Mild  degenerative appearing retrolisthesis at from C4 on C5-C6 on C7. Vertebrae: No marrow edema or evidence of acute osseous abnormality. Degenerative endplate marrow signal changes. Cord: Capacious spinal canal at most levels. Cervical and visible upper thoracic spinal cord remain within normal limits. Posterior Fossa, vertebral arteries, paraspinal tissues: Cervicomedullary junction is within normal limits. Negative visible posterior fossa. Preserved major vascular flow voids in the neck. Negative visible neck soft tissues. Disc levels: C2-C3:  Negative. C3-C4: Disc space loss. Mostly foraminal disc bulge and endplate spurring. No spinal stenosis. Moderate bilateral C4 foraminal stenosis. C4-C5: Mild retrolisthesis and disc space loss. Circumferential disc osteophyte complex and mild posterior element hypertrophy. No significant spinal stenosis. Moderate to severe left and moderate right C5 foraminal stenosis. C5-C6: Mild retrolisthesis with disc space loss and circumferential disc osteophyte complex. Mild posterior element hypertrophy. No significant spinal stenosis. Moderate to severe bilateral C6 foraminal stenosis. C6-C7: Mild retrolisthesis. Disc space loss. Mild circumferential disc osteophyte complex. Mild posterior element hypertrophy. No spinal stenosis. Mild to moderate C7 foraminal stenosis greater on the right. C7-T1: Negative disc. Mild to moderate facet hypertrophy. No stenosis. No visible upper thoracic spinal stenosis. There is bilateral upper thoracic facet hypertrophy. 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 But capacious spinal canal with no significant spinal stenosis. Normal cervical spinal cord. 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 Electronically Signed   By: Genevie Ann M.D.   On: 09/20/2019 19:25   US RENAL  Result Date: 09/20/2019 CLINICAL DATA:  Acute renal injury. EXAM: RENAL / URINARY TRACT  ULTRASOUND COMPLETE COMPARISON:  None. FINDINGS: Right Kidney: Renal measurements: 10.4 cm x 4.4 cm x 5.1 cm = volume: 122.1 mL. Echogenicity within normal limits. No mass or hydronephrosis visualized. Left Kidney: Renal measurements: 11.4 cm x 6.6 cm x 5.8 cm = volume: 225.9 mL. Echogenicity within normal limits. No mass or hydronephrosis visualized. Bladder: Appears normal for degree of bladder distention. Other: None. IMPRESSION: Normal renal ultrasound. Electronically Signed   By: Virgina Norfolk M.D.   On: 09/20/2019 18:04   Medications: . cefTAZidime (FORTAZ)  IV Stopped (09/21/19 2150)   . carbidopa-levodopa  0.5 tablet Oral BID  . DULoxetine  60 mg Oral Daily  . finasteride  5 mg Oral Daily  . gatifloxacin  1 drop Left Eye QID  . loratadine  10 mg Oral Daily  . mirabegron ER  50 mg Oral Daily  . neomycin-polymyxin b-dexamethasone  1 application Left Eye QID  . prednisoLONE acetate  1 drop Left Eye QID     Assessment/Plan: **AKI:  Normal baseline renal function now with nonoliguric severe AKI with Cr up to 4 9/18.   Renal US ok. Given time course I suspect that this is hemodynamically mediated AKI/ATN in setting of protracted modest hypotension with possible contributors of hypovolemia (poor po intake), Vanc, urinary retention. UA ok (blood  on dip, none really on microscopy). No indications for dialysis and will give supportive care and avoid further insults --> need may arise, discussed with daughter.  --PVR q8h ordered, I/O cath if > 267mL  --tolerating oral intake, hold on further IVF for now --Check CK with blood on dipstick but none really on microscopy --Avoid further insults.  BP support with hydrocortisone per below may help.  **Hyponatremia: initial serum sodium 123, now 128.   Urine osm 201 and improved with volume c/w yesterday.  AM cortisol 3 - I think adrenal insufficiency may be playing a role and starting stress dose steroids today. Low urine osm inconsistent with  SIADH.  Strict I/Os, daily weights. Trend.  **HTN: currently hypotensive. Hold all meds.   **Thrombocytopenia: modest. Follow.  **Ruptured globe: per optho.  Jannifer Hick MD 09/22/2019, 7:52 AM  [facility] Pager: [phone]

## 2019-09-23 LAB — COMPREHENSIVE METABOLIC PANEL
ALT: 13 U/L (ref 0–44)
AST: 136 U/L — ABNORMAL HIGH (ref 15–41)
Albumin: 1.7 g/dL — ABNORMAL LOW (ref 3.5–5.0)
Alkaline Phosphatase: 201 U/L — ABNORMAL HIGH (ref 38–126)
Anion gap: 10 (ref 5–15)
BUN: 63 mg/dL — ABNORMAL HIGH (ref 8–23)
CO2: 21 mmol/L — ABNORMAL LOW (ref 22–32)
Calcium: 8.2 mg/dL — ABNORMAL LOW (ref 8.9–10.3)
Chloride: 100 mmol/L (ref 98–111)
Creatinine, Ser: 5.05 mg/dL — ABNORMAL HIGH (ref 0.61–1.24)
GFR calc Af Amer: 12 mL/min — ABNORMAL LOW (ref >60)
GFR calc non Af Amer: 10 mL/min — ABNORMAL LOW (ref >60)
Glucose, Bld: 135 mg/dL — ABNORMAL HIGH (ref 70–99)
Potassium: 4.7 mmol/L (ref 3.5–5.1)
Sodium: 131 mmol/L — ABNORMAL LOW (ref 135–145)
Total Bilirubin: 6.4 mg/dL — ABNORMAL HIGH (ref 0.3–1.2)
Total Protein: 7.1 g/dL (ref 6.5–8.1)

## 2019-09-23 LAB — CBC
HCT: 33.8 % — ABNORMAL LOW (ref 39.0–52.0)
Hemoglobin: 12.3 g/dL — ABNORMAL LOW (ref 13.0–17.0)
MCH: 35.1 pg — ABNORMAL HIGH (ref 26.0–34.0)
MCHC: 36.4 g/dL — ABNORMAL HIGH (ref 30.0–36.0)
MCV: 96.6 fL (ref 80.0–100.0)
Platelets: 132 10*3/uL — ABNORMAL LOW (ref 150–400)
RBC: 3.5 MIL/uL — ABNORMAL LOW (ref 4.22–5.81)
RDW: 18.5 % — ABNORMAL HIGH (ref 11.5–15.5)
WBC: 7.1 10*3/uL (ref 4.0–10.5)
nRBC: 0 % (ref 0.0–0.2)

## 2019-09-23 LAB — SURGICAL PATHOLOGY

## 2019-09-23 LAB — MAGNESIUM: Magnesium: 2.3 mg/dL (ref 1.7–2.4)

## 2019-09-23 LAB — PHOSPHORUS: Phosphorus: 7.1 mg/dL — ABNORMAL HIGH (ref 2.5–4.6)

## 2019-09-23 LAB — CK: Total CK: 149 U/L (ref 49–397)

## 2019-09-23 MED ORDER — SODIUM CHLORIDE 0.9 % IV SOLN
800.0000 mg | INTRAVENOUS | Status: DC
  Administered 2019-09-23: 800 mg via INTRAVENOUS
  Filled 2019-09-23: qty 16

## 2019-09-23 MED ORDER — SENNOSIDES-DOCUSATE SODIUM 8.6-50 MG PO TABS
1.0000 | ORAL_TABLET | Freq: Two times a day (BID) | ORAL | Status: DC
  Administered 2019-09-23 – 2019-09-30 (×14): 1 via ORAL
  Filled 2019-09-23 (×14): qty 1

## 2019-09-23 NOTE — Progress Notes (Signed)
PROGRESS NOTE    Nicholas Ramirez  YKD:[id number] DOB: 1945/09/22 DOA: 09/18/2019 PCP: Townsend Roger, MD  Brief Narrative:  Patient is a 74 year old male with HTN, heart murmur, cataracts, hepatitis C presented to [facility] a fall at home.  Patient had multiple falls over the last few months at home. He has been evaluated at outpatient neurology (Dr Tomi Likens) and has had CT scan and MRI of his brain performed. He reported he has had generalized weakness with a slow unsteady gait. He was recently started on levodopa, carbidopa for possible Parkinson's disease by neurology.He fell on the dresser and has sustained a laceration to the side of his eye and hit his eye. He was unable to see and EMS was called. He reports that he had normal vision with his glasses in both eyes until this fall. He reports he cannot see out of the left eye after his fall. He was found to have hyphema and possible hematoma in left eye on CT scan.He denies any loss of consciousness with the fall. CT scan of his orbits shows probable rupture of anterior left globe. Ophthalmology has been consulted and  He underwent surgical repair 9/16. Patient was given broad-spectrum antibiotics in the emergency room as well as a tetanus shot. Renal functions worsening, Nephrology consulted. Vancomycin discontinued, continued on supportive care, No need for hemodialysis at this time.   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 -Patient was evaluated by ophthalmology, status post surgical repair on 9/16 am. -Ophthalmology following, appreciate Dr. Roda Shutters recommendations, visual prognosis for the eye is extremely guarded, eye  shield at all times -Per recommendations, continue broad-spectrum IV antibiotics and transitioning to p.o. broad-spectrum on discharge, eyedrops, follow-up with ophthalmology outpatient on Monday.   Active Problems: Accelerated hypertension - BP on admission elevated however been running soft , will hold off on lisinopril, and amlodipine.   - Hold propranolol.  Acute kidney injury, acute urinary retention: -Patient did have an episode of acute urinary retention , has history of BPH -Creatinine 2.71-> 4.1 , held vancomycin, lisinopril, avoid hypotension. -Placed on IV fluid hydration, renal ultrasound ruled out any obstruction or hydronephrosis -Resumed finasteride, mirabegron ER  -Recheck BMP showed worsening renal functions, Nephrology consulted. -Patient may require foley catheter if he continues to have urinary retention. -No indication for Hemodialysis at this time, continue supportive care. - Gentle hydration for 12 hrs. Started on stress dose of steroids. - serum creatinine continues to go up .  Mechanical fall, recurrent falls at home, recent diagnosis of Parkinson's -PT/OT evaluation recommended CIR, consult placed -Reviewed recent neurology note by Dr. 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 - started Sinemet, discussed with the patient and daughter.   Hyponatremia -Serum osmolality 275, urine osmolality 41, urine sodium 108, SIADH component  -IV fluids were held 9/17. Nephrology consulted, recommended gentle hydration x 12 hrs. -Sodium improved to 131 today with gentle hydration.   Prolonged QT interval Avoid QT prolonging medications -Monitor K, magnesium   Obesity Estimated body mass index is  38.19 kg/m as calculated from the following:   Height as of  this encounter: 5\' 9"  (1.753 m).   Weight as of this encounter: 117.3 kg.   DVT prophylaxis:SCDs. Code Status: Full Family Communication: Spoke with daughter at bed side. Disposition Plan:  Status is: Inpatient  Remains inpatient appropriate because:Inpatient level of care appropriate due to severity of illness   Dispo: The patient is from: Home              Anticipated d/c is to: CIR              Anticipated d/c date is: 3 days              Patient currently is not medically stable to d/c.   Consultants:   Ophthalmology, nephrology  Procedures: Antimicrobials:  Anti-infectives (From admission, onward)   Start     Dose/Rate Route Frequency Ordered Stop   09/21/19 2200  cefTAZidime (FORTAZ) 1 g in sodium chloride 0.9 % 100 mL IVPB       Note to Pharmacy: Ruptured left eye globe after fall at home and hit head on furniture   1 g 200 mL/hr over 30 Minutes Intravenous Every 24 hours 09/21/19 0731     09/20/19 2200  cefTAZidime (FORTAZ) 1 g in sodium chloride 0.9 % 100 mL IVPB  Status:  Discontinued       Note to Pharmacy: Ruptured left eye globe after fall at home and hit head on furniture   1 g 200 mL/hr over 30 Minutes Intravenous Every 12 hours 09/20/19 1255 09/21/19 0731   09/20/19 1231  vancomycin variable dose per unstable renal function (pharmacist dosing)  Status:  Discontinued         Does not apply See admin instructions 09/20/19 1231 09/20/19 1242   09/19/19 0800  vancomycin (VANCOCIN) IVPB 1000 mg/200 mL premix  Status:  Discontinued        1,000 mg 200 mL/hr over 60 Minutes Intravenous Every 12 hours 09/18/19 1932 09/20/19 1231   09/19/19 0300  cefTAZidime (FORTAZ) 1 g in sodium chloride 0.9 % 100 mL IVPB  Status:  Discontinued       Note to Pharmacy: Ruptured left eye globe after fall at home and hit head on furniture   1 g 200 mL/hr over 30 Minutes Intravenous Every 8 hours 09/19/19 0138 09/20/19 1255   09/18/19 2304  polymyxin 500,000 units/ceftazidime 1 g  ophthalmic irrigation  Status:  Discontinued          As needed 09/18/19 2304 09/19/19 0044   09/18/19 2000  vancomycin (VANCOCIN) 2,500 mg in sodium chloride 0.9 % 500 mL IVPB        2,500 mg 250 mL/hr over 120 Minutes Intravenous  Once 09/18/19 1903 09/18/19 2243   09/18/19 1845  cefTAZidime (FORTAZ) 2 g in sodium chloride 0.9 % 100 mL IVPB        2 g 200 mL/hr over 30 Minutes Intravenous  Once 09/18/19 1809 09/18/19 1916      Subjective: Patient was seen and examined at bedside.  Overnight events noted , Patient reports feeling slightly better.   Left eye covered with shield.   He reports pain is better controlled, denies Nausea, vomiting or diarrhea.  Objective: Vitals:   09/22/19 2056 09/23/19 0443 09/23/19 0454 09/23/19 0900  BP: 110/78 117/85  115/77  Pulse: 85 88  82  Resp: 15 18  18   Temp: 98.9 F (37.2 C) 98.8 F (37.1 C)  98.6  F (37 C)  TempSrc: Oral Oral  Oral  SpO2: 96% 98%  95%  Weight:   109.3 kg   Height:        Intake/Output Summary (Last 24 hours) at 09/23/2019 1544 Last data filed at 09/23/2019 1300 Gross per 24 hour  Intake 610 ml  Output 850 ml  Net -240 ml   Filed Weights   09/20/19 2127 09/22/19 0500 09/23/19 0454  Weight: 117.3 kg 108.9 kg 109.3 kg    Examination:  General exam: Appears calm and comfortable , Left eye covered with shield. Respiratory system: Clear to auscultation. Respiratory effort normal.Good air entry  Cardiovascular system: S1 & S2 heard, RRR. No JVD, murmurs, rubs, gallops or clicks. No pedal edema. Gastrointestinal system: Abdomen is nondistended, soft and nontender. No organomegaly or masses felt.  Normal bowel sounds heard. Central nervous system: Alert and oriented. No focal neurological deficits. Extremities:  No cyanosis, no clubbing,  Pitting edema+ noted Skin: No rashes, lesions or ulcers Psychiatry: Judgement and insight appear normal. Mood & affect appropriate.     Data Reviewed: I have personally reviewed  following labs and imaging studies  CBC: Recent Labs  Lab 09/18/19 1811 09/19/19 0341 09/22/19 0736 09/23/19 0455  WBC 6.8 6.0 7.1 7.1  NEUTROABS 3.7  --   --   --   HGB 12.6* 12.7* 11.7* 12.3*  HCT 34.8* 34.6* 33.4* 33.8*  MCV 96.7 94.3 96.5 96.6  PLT 103* 115* 117* 751*   Basic Metabolic Panel: Recent Labs  Lab 09/20/19 0737 09/20/19 1855 09/21/19 0214 09/22/19 0736 09/23/19 0455  NA 127* 127* 126* 128* 131*  K 4.6 4.2 4.2 3.9 4.7  CL 95* 96* 96* 100 100  CO2 21* 20* 20* 19* 21*  GLUCOSE 107* 137* 95 102* 135*  BUN 25* 34* 38* 53* 63*  CREATININE 2.71* 3.54* 4.01* 4.40* 5.05*  CALCIUM 8.2* 8.0* 8.0* 8.0* 8.2*  MG  --   --   --  2.1 2.3  PHOS  --   --   --  6.2* 7.1*   GFR: Estimated Creatinine Clearance: 15.9 mL/min (A) (by C-G formula based on SCr of 5.05 mg/dL (H)). Liver Function Tests: Recent Labs  Lab 09/23/19 0455  AST 136*  ALT 13  ALKPHOS 201*  BILITOT 6.4*  PROT 7.1  ALBUMIN 1.7*   No results for input(s): LIPASE, AMYLASE in the last 168 hours. No results for input(s): AMMONIA in the last 168 hours. Coagulation Profile: Recent Labs  Lab 09/18/19 1811  INR 1.3*   Cardiac Enzymes: Recent Labs  Lab 09/23/19 0455  CKTOTAL 149   BNP (last 3 results) No results for input(s): PROBNP in the last 8760 hours. HbA1C: No results for input(s): HGBA1C in the last 72 hours. CBG: No results for input(s): GLUCAP in the last 168 hours. Lipid Profile: No results for input(s): CHOL, HDL, LDLCALC, TRIG, CHOLHDL, LDLDIRECT in the last 72 hours. Thyroid Function Tests: Recent Labs    09/22/19 0736  TSH 0.506   Anemia Panel: No results for input(s): VITAMINB12, FOLATE, FERRITIN, TIBC, IRON, RETICCTPCT in the last 72 hours. Sepsis Labs: No results for input(s): PROCALCITON, LATICACIDVEN in the last 168 hours.  Recent Results (from the past 240 hour(s))  SARS Coronavirus 2 by RT PCR (hospital order, performed in [facility] hospital lab)  Nasopharyngeal Nasopharyngeal Swab     Status: None   Collection Time: 09/18/19  6:00 PM   Specimen: Nasopharyngeal Swab  Result Value Ref Range Status  SARS Coronavirus 2 NEGATIVE NEGATIVE Final    Comment: (NOTE) SARS-CoV-2 target nucleic acids are NOT DETECTED.  The SARS-CoV-2 RNA is generally detectable in upper and lower respiratory specimens during the acute phase of infection. The lowest concentration of SARS-CoV-2 viral copies this assay can detect is 250 copies / mL. A negative result does not preclude SARS-CoV-2 infection and should not be used as the sole basis for treatment or other patient management decisions.  A negative result may occur with improper specimen collection / handling, submission of specimen other than nasopharyngeal swab, presence of viral mutation(s) within the areas targeted by this assay, and inadequate number of viral copies (<250 copies / mL). A negative result must be combined with clinical observations, patient history, and epidemiological information.  Fact Sheet for Patients:   StrictlyIdeas.no  Fact Sheet for Healthcare Providers: BankingDealers.co.za  This test is not yet approved or  cleared by the [place] FDA and has been authorized for detection and/or diagnosis of SARS-CoV-2 by FDA under an Emergency Use Authorization (EUA).  This EUA will remain in effect (meaning this test can be used) for the duration of the COVID-19 declaration under Section 564(b)(1) of the Act, 21 U.S.C. section 360bbb-3(b)(1), unless the authorization is terminated or revoked sooner.  Performed at [facility], [place] [address] [place], [place] [address]          Radiology Studies: No results found.  Scheduled Meds: . carbidopa-levodopa  0.5 tablet Oral BID  . DULoxetine  60 mg Oral Daily  . finasteride  5 mg Oral Daily  . gatifloxacin  1 drop Left Eye QID  . hydrocortisone sod succinate  (SOLU-CORTEF) inj  100 mg Intravenous Q8H  . loratadine  10 mg Oral Daily  . mirabegron ER  50 mg Oral Daily  . neomycin-polymyxin b-dexamethasone  1 application Left Eye QID  . prednisoLONE acetate  1 drop Left Eye QID   Continuous Infusions: . cefTAZidime (FORTAZ)  IV Stopped (09/22/19 2151)     LOS: 5 days    Time spent:  25 mins.    Shawna Clamp, MD [facility]   If 7PM-7AM, please contact night-coverage

## 2019-09-23 NOTE — Progress Notes (Signed)
Inpatient Rehab Admissions Coordinator:   I spoke with Pt.'s wife over the phone and she expressed interest in CIR admission as well due to Pt.'s recent frequent falls and potential Parkinson's dx. She confirmed that she can provide 24/7 support at d/c  Clemens Catholic, [place], [place] Admissions Coordinator  [phone] ([place]) [phone] (office)

## 2019-09-23 NOTE — Progress Notes (Addendum)
Patient ID: Jaykwon Morones, male   DOB: 06-21-1945, 74 y.o.   MRN: [id number] S: Feeling a little better  O:BP 115/77 (BP Location: Right Arm)   Pulse 82   Temp 98.6 F (37 C) (Oral)   Resp 18   Ht 5\' 9"  (1.753 m)   Wt 109.3 kg   SpO2 95%   BMI 35.59 kg/m   Intake/Output Summary (Last 24 hours) at 09/23/2019 1542 Last data filed at 09/23/2019 1300 Gross per 24 hour  Intake 610 ml  Output 850 ml  Net -240 ml   Intake/Output: I/O last 3 completed shifts: In: 1505.9 [P.O.:690; I.V.:608.9; IV Piggyback:207] Out: 2050 [Urine:2050]  Intake/Output this shift:  Total I/O In: 360 [P.O.:360] Out: -  Weight change: 0.454 kg Gen: chronically ill-appearing AAM in NAD CVS: RRR, no rub Resp: cta Abd: +BS, soft,NT/ND Ext: trace pretibial edema of LLE AND LUE   Recent Labs  Lab 09/18/19 1811 09/19/19 0341 09/20/19 0737 09/20/19 1855 09/21/19 0214 09/22/19 0736 09/23/19 0455  NA 123* 125* 127* 127* 126* 128* 131*  K 3.5 4.0 4.6 4.2 4.2 3.9 4.7  CL 91* 94* 95* 96* 96* 100 100  CO2 22 18* 21* 20* 20* 19* 21*  GLUCOSE 101* 133* 107* 137* 95 102* 135*  BUN 6* 8 25* 34* 38* 53* 63*  CREATININE 0.80 0.79 2.71* 3.54* 4.01* 4.40* 5.05*  ALBUMIN  --   --   --   --   --   --  1.7*  CALCIUM 7.9* 8.1* 8.2* 8.0* 8.0* 8.0* 8.2*  PHOS  --   --   --   --   --  6.2* 7.1*  AST  --   --   --   --   --   --  136*  ALT  --   --   --   --   --   --  13   Liver Function Tests: Recent Labs  Lab 09/23/19 0455  AST 136*  ALT 13  ALKPHOS 201*  BILITOT 6.4*  PROT 7.1  ALBUMIN 1.7*   No results for input(s): LIPASE, AMYLASE in the last 168 hours. No results for input(s): AMMONIA in the last 168 hours. CBC: Recent Labs  Lab 09/18/19 1811 09/18/19 1811 09/19/19 0341 09/22/19 0736 09/23/19 0455  WBC 6.8   < > 6.0 7.1 7.1  NEUTROABS 3.7  --   --   --   --   HGB 12.6*   < > 12.7* 11.7* 12.3*  HCT 34.8*   < > 34.6* 33.4* 33.8*  MCV 96.7  --  94.3 96.5 96.6  PLT 103*   < > 115* 117* 132*    < > = values in this interval not displayed.   Cardiac Enzymes: Recent Labs  Lab 09/23/19 0455  CKTOTAL 149   CBG: No results for input(s): GLUCAP in the last 168 hours.  Iron Studies: No results for input(s): IRON, TIBC, TRANSFERRIN, FERRITIN in the last 72 hours. Studies/Results: No results found. . carbidopa-levodopa  0.5 tablet Oral BID  . DULoxetine  60 mg Oral Daily  . finasteride  5 mg Oral Daily  . gatifloxacin  1 drop Left Eye QID  . hydrocortisone sod succinate (SOLU-CORTEF) inj  100 mg Intravenous Q8H  . loratadine  10 mg Oral Daily  . mirabegron ER  50 mg Oral Daily  . neomycin-polymyxin b-dexamethasone  1 application Left Eye QID  . prednisoLONE acetate  1 drop Left Eye QID  BMET    Component Value Date/Time   NA 131 (L) 09/23/2019 0455   K 4.7 09/23/2019 0455   CL 100 09/23/2019 0455   CO2 21 (L) 09/23/2019 0455   GLUCOSE 135 (H) 09/23/2019 0455   BUN 63 (H) 09/23/2019 0455   CREATININE 5.05 (H) 09/23/2019 0455   CALCIUM 8.2 (L) 09/23/2019 0455   GFRNONAA 10 (L) 09/23/2019 0455   GFRAA 12 (L) 09/23/2019 0455   CBC    Component Value Date/Time   WBC 7.1 09/23/2019 0455   RBC 3.50 (L) 09/23/2019 0455   HGB 12.3 (L) 09/23/2019 0455   HCT 33.8 (L) 09/23/2019 0455   PLT 132 (L) 09/23/2019 0455   MCV 96.6 09/23/2019 0455   MCH 35.1 (H) 09/23/2019 0455   MCHC 36.4 (H) 09/23/2019 0455   RDW 18.5 (H) 09/23/2019 0455   LYMPHSABS 1.8 09/18/2019 1811   MONOABS 0.9 09/18/2019 1811   EOSABS 0.2 09/18/2019 1811   BASOSABS 0.1 09/18/2019 1811     Assessment/Plan:  1. AKI, non-oliguric, multifactorial, in setting of hemodynamically mediated ischemic ATN (hypotension, hypovolemia, IV vanco and urinary retention in setting of ACE inhibitor).  UOP has been increasing but need to recheck bladder scans and use I&O cath if PVRs remain elevated. 2. 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  ophthamology performed  surgical repair on 9/16. 3. HTN- on low side and taken off of lisinopril 9/18. 4. Hyponatremia- resolved 5. Urinary retention- bladder scan and I&O cath vs foley 6. Abnormal LFT's- increasing t. Bilirubin and elevated AST.  Continue to follow LFT's 7. Constipation- per pt no BM since last Wednesday.  Consider laxatives. Per primary.   Donetta Potts, MD [employer] [phone]

## 2019-09-24 LAB — COMPREHENSIVE METABOLIC PANEL
ALT: 17 U/L (ref 0–44)
AST: 106 U/L — ABNORMAL HIGH (ref 15–41)
Albumin: 1.7 g/dL — ABNORMAL LOW (ref 3.5–5.0)
Alkaline Phosphatase: 183 U/L — ABNORMAL HIGH (ref 38–126)
Anion gap: 13 (ref 5–15)
BUN: 75 mg/dL — ABNORMAL HIGH (ref 8–23)
CO2: 20 mmol/L — ABNORMAL LOW (ref 22–32)
Calcium: 8.3 mg/dL — ABNORMAL LOW (ref 8.9–10.3)
Chloride: 100 mmol/L (ref 98–111)
Creatinine, Ser: 4.92 mg/dL — ABNORMAL HIGH (ref 0.61–1.24)
GFR calc Af Amer: 13 mL/min — ABNORMAL LOW (ref >60)
GFR calc non Af Amer: 11 mL/min — ABNORMAL LOW (ref >60)
Glucose, Bld: 132 mg/dL — ABNORMAL HIGH (ref 70–99)
Potassium: 4.7 mmol/L (ref 3.5–5.1)
Sodium: 133 mmol/L — ABNORMAL LOW (ref 135–145)
Total Bilirubin: 5.7 mg/dL — ABNORMAL HIGH (ref 0.3–1.2)
Total Protein: 6.8 g/dL (ref 6.5–8.1)

## 2019-09-24 LAB — CBC
HCT: 34.2 % — ABNORMAL LOW (ref 39.0–52.0)
Hemoglobin: 12.2 g/dL — ABNORMAL LOW (ref 13.0–17.0)
MCH: 34.1 pg — ABNORMAL HIGH (ref 26.0–34.0)
MCHC: 35.7 g/dL (ref 30.0–36.0)
MCV: 95.5 fL (ref 80.0–100.0)
Platelets: 113 10*3/uL — ABNORMAL LOW (ref 150–400)
RBC: 3.58 MIL/uL — ABNORMAL LOW (ref 4.22–5.81)
RDW: 18.5 % — ABNORMAL HIGH (ref 11.5–15.5)
WBC: 8.3 10*3/uL (ref 4.0–10.5)
nRBC: 0 % (ref 0.0–0.2)

## 2019-09-24 LAB — MAGNESIUM: Magnesium: 2.4 mg/dL (ref 1.7–2.4)

## 2019-09-24 LAB — PHOSPHORUS: Phosphorus: 6.3 mg/dL — ABNORMAL HIGH (ref 2.5–4.6)

## 2019-09-24 MED ORDER — LEVOFLOXACIN 500 MG PO TABS
500.0000 mg | ORAL_TABLET | ORAL | Status: DC
  Administered 2019-09-25 – 2019-09-30 (×4): 500 mg via ORAL
  Filled 2019-09-24 (×6): qty 1

## 2019-09-24 MED ORDER — DOXYCYCLINE HYCLATE 100 MG PO TABS
100.0000 mg | ORAL_TABLET | Freq: Two times a day (BID) | ORAL | Status: DC
  Administered 2019-09-25 – 2019-09-30 (×11): 100 mg via ORAL
  Filled 2019-09-24 (×11): qty 1

## 2019-09-24 MED ORDER — CHLORHEXIDINE GLUCONATE CLOTH 2 % EX PADS
6.0000 | MEDICATED_PAD | Freq: Every day | CUTANEOUS | Status: DC
  Administered 2019-09-24 – 2019-09-30 (×7): 6 via TOPICAL

## 2019-09-24 NOTE — Progress Notes (Signed)
Subjective: Patient feels he may have seen some light in the left eye yesterday, but his is not sure. Complains of soreness OS.  EXAMINATION  VAsc OS:NLP  Pupils:  QG:BEEFE not visible due to complete hyphema  T(Pen): OS:4 mm Hg   Anterior SegmentExam (patient lying in bed, exam with penlight/indirect/20D lens): Ext/Lids:R:normalL:+lidedema, no lid laceration Conj/Sclera: ODwhite and quiet OS: scleral sutures intact and well covered by conjunctiva. 360 subconj hemorrhage. +Serosanguinous discharge Cornea:ODclear OS:no corneal laceration, +Small K epi defect 1-73mm nasally, +Corneal edema AC:OD:Deep and Quiet, OS: 100%/completehyphema, difficult to assess depth, though formed and with fair depth observed during tonopen applanation, improved depth compared to preop Iris:OD: round and flat, OS: not visible Lens:OD2+ NSOS: not visible   Imp/Plan:  1.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 - POD#6 s/p Open globe repair with resection of prolapsed uveal tissue - complete water-tight closure not able to be achieved due to tissue loss and edema - No light perception vision on presentation and on exam today - eyeshield at all times - We discussed the visual prognosis for the eye is extremely guarded as before - AKI during hospital stay and Vanc stopped. Consider Levofloxacin PO and Doxycycline PO to finish prophylactic antibiotic course - Eye drops as below      - gatifloxacin QID OS      - Pred acetate 1% QID OS      - Neo/Poly/Dex ointment QID OS - Please space drops and ointment by at least 5 minutes, placing the ointment last, and then resecure/retape the [facility]

## 2019-09-24 NOTE — Progress Notes (Addendum)
Pt. A&O x 4. Heart and lung sounds clear. Total care,  He is 2+ person assist.  Tolerates small meals and fluids well. q2h turns in bed.  Pt. Left eye is intermittently drains blood.   Foley placed @ 1418, performed by 1 RN, viewed by another RN & me.  CHG bath & oral care completed. Weakness/immobilty in left arm due to carpal tunnel.   Spoke to daughter and wife @ 1500. I stated" He is alert and oriented x 4, tolerating meals, foley has been place, bath & oral care completed, physical therapy help assist him in the chair this AM."   Daughter will be here @ 0700 9/22.

## 2019-09-24 NOTE — Progress Notes (Signed)
PROGRESS NOTE    Rushawn Capshaw  AVW:[id number] DOB: [birth year] DOA: 09/18/2019 PCP: Townsend Roger, MD  Brief Narrative:  Patient is a 74 year old male with HTN, heart murmur, cataracts, hepatitis C presented to [place] a fall at home.  Patient had multiple falls over the last few months at home. He has been evaluated at outpatient neurology (Dr Tomi Likens) and has had CT scan and MRI of his brain performed. He reported he has had generalized weakness with a slow unsteady gait. He was recently started on levodopa, carbidopa for possible Parkinson's disease by neurology.He fell on the dresser and has sustained a laceration to the side of his eye and hit his eye. He was unable to see and EMS was called. He reports that he had normal vision with his glasses in both eyes until this fall. He reports he cannot see out of the left eye after his fall. He was found to have hyphema and possible hematoma in left eye on CT scan.He denies any loss of consciousness with the fall. CT scan of his orbits shows probable rupture of anterior left globe. Ophthalmology has been consulted and  He underwent surgical repair 9/16. ophthalmology recommended broad spectrum antibiotics. He has developed AKI , Nephrology consulted. Vancomycin discontinued, continued on supportive care, No need for hemodialysis at this time.   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 -Patient was evaluated by ophthalmology, status post surgical repair on 9/16 am. -Ophthalmology following, appreciate Dr. Roda Shutters recommendations, visual prognosis for the eye is extremely guarded, eye shield at all times -Per recommendations,  continue broad-spectrum IV antibiotics and transitioning to p.o. broad-spectrum on discharge, eyedrops, follow-up with ophthalmology as outpatient. -Antibiotic changed to levofloxacin and doxycycline p.o.   Active Problems: Accelerated hypertension - BP on admission elevated however been normal. , will hold off on lisinopril, and amlodipine.   - Hold propranolol.  Acute kidney injury, acute urinary retention: -Patient did have an episode of acute urinary retention , has history of BPH -Creatinine 2.71-> 4.1 , held vancomycin, lisinopril, avoid hypotension. -Placed on IV fluid hydration, renal ultrasound ruled out any obstruction or hydronephrosis -Resumed finasteride, mirabegron ER  -Recheck BMP showed worsening renal functions, Nephrology consulted. -Patient needed foley catheter , since he continues to have urinary retention. -No indication for Hemodialysis at this time, continue supportive care. - Given stress dose of steroids. - serum creatinine peaked 5.05 trending down 4.92  Mechanical fall, recurrent falls at home, recent diagnosis of Parkinson's -PT/OT evaluation recommended CIR, consult placed -Reviewed recent neurology note by Dr. 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 - started Sinemet, discussed with the patient and daughter.   Hyponatremia >>> Improving -Serum osmolality 275, urine osmolality 41, urine sodium 108, SIADH component  -IV fluids were held 9/17. Nephrology consulted, recommended gentle hydration x 12 hrs. -Sodium improved to 133 today with gentle hydration.   Prolonged QT interval Avoid QT prolonging medications -Monitor K, magnesium   Obesity Estimated body mass index is 38.19 kg/m as calculated from the following:  Height as of this  encounter: 5\' 9"  (1.753 m).   Weight as of this encounter: 117.3 kg.   DVT prophylaxis:SCDs. Code Status: Full Family Communication: Spoke with daughter at bed side. Disposition Plan:  Status is: Inpatient  Remains inpatient appropriate because:Inpatient level of care appropriate due to severity of illness   Dispo: The patient is from: Home              Anticipated d/c is to: CIR              Anticipated d/c date is: 3 days              Patient currently is not medically stable to d/c.   Consultants:   Ophthalmology, nephrology  Procedures: Antimicrobials:  Anti-infectives (From admission, onward)   Start     Dose/Rate Route Frequency Ordered Stop   09/25/19 1000  doxycycline (VIBRA-TABS) tablet 100 mg        100 mg Oral Every 12 hours 09/24/19 1039 10/02/19 0959   09/25/19 1000  levofloxacin (LEVAQUIN) tablet 500 mg        500 mg Oral Every 48 hours 09/24/19 1039     09/23/19 2000  DAPTOmycin (CUBICIN) 800 mg in sodium chloride 0.9 % IVPB  Status:  Discontinued        800 mg 232 mL/hr over 30 Minutes Intravenous Every 48 hours 09/23/19 1728 09/24/19 1039   09/21/19 2200  cefTAZidime (FORTAZ) 1 g in sodium chloride 0.9 % 100 mL IVPB       Note to Pharmacy: Ruptured left eye globe after fall at home and hit head on furniture   1 g 200 mL/hr over 30 Minutes Intravenous Every 24 hours 09/21/19 0731 09/25/19 2159   09/20/19 2200  cefTAZidime (FORTAZ) 1 g in sodium chloride 0.9 % 100 mL IVPB  Status:  Discontinued       Note to Pharmacy: Ruptured left eye globe after fall at home and hit head on furniture   1 g 200 mL/hr over 30 Minutes Intravenous Every 12 hours 09/20/19 1255 09/21/19 0731   09/20/19 1231  vancomycin variable dose per unstable renal function (pharmacist dosing)  Status:  Discontinued         Does not apply See admin instructions 09/20/19 1231 09/20/19 1242   09/19/19 0800  vancomycin (VANCOCIN) IVPB 1000 mg/200 mL premix  Status:  Discontinued        1,000  mg 200 mL/hr over 60 Minutes Intravenous Every 12 hours 09/18/19 1932 09/20/19 1231   09/19/19 0300  cefTAZidime (FORTAZ) 1 g in sodium chloride 0.9 % 100 mL IVPB  Status:  Discontinued       Note to Pharmacy: Ruptured left eye globe after fall at home and hit head on furniture   1 g 200 mL/hr over 30 Minutes Intravenous Every 8 hours 09/19/19 0138 09/20/19 1255   09/18/19 2304  polymyxin 500,000 units/ceftazidime 1 g ophthalmic irrigation  Status:  Discontinued          As needed 09/18/19 2304 09/19/19 0044   09/18/19 2000  vancomycin (VANCOCIN) 2,500 mg in sodium chloride 0.9 % 500 mL IVPB        2,500 mg 250 mL/hr over 120 Minutes Intravenous  Once 09/18/19 1903 09/18/19 2243   09/18/19 1845  cefTAZidime (FORTAZ) 2 g in sodium chloride 0.9 % 100 mL IVPB        2 g 200 mL/hr over 30 Minutes Intravenous  Once 09/18/19 1809 09/18/19 1916  Subjective: Patient was seen and examined at bedside.  Overnight events noted , Left eye covered with shield. Patient reports feeling better,  he complains of having left eye pain,  denies any headache, nausea and vomiting.   He report not having a bowel movement in last couple of days. Objective: Vitals:   09/23/19 1600 09/23/19 2134 09/24/19 0451 09/24/19 1000  BP: 126/86 93/63 98/68  129/82  Pulse: 92 88 90 91  Resp: 18 18 18 18   Temp: 98.6 F (37 C) 98.9 F (37.2 C) 98.9 F (37.2 C) 97.8 F (36.6 C)  TempSrc: Oral Oral Oral Oral  SpO2: 92% 96% 98% 100%  Weight:      Height:        Intake/Output Summary (Last 24 hours) at 09/24/2019 1609 Last data filed at 09/24/2019 1550 Gross per 24 hour  Intake 691.07 ml  Output 2550 ml  Net -1858.93 ml   Filed Weights   09/20/19 2127 09/22/19 0500 09/23/19 0454  Weight: 117.3 kg 108.9 kg 109.3 kg    Examination:  General exam: Appears calm and comfortable , Left eye covered with shield. Respiratory system: Clear to auscultation. Respiratory effort normal.Good air entry  Cardiovascular  system: S1 & S2 heard, RRR. No JVD, murmurs, rubs, gallops or clicks. No pedal edema. Gastrointestinal system: Abdomen is distended, soft and nontender. No organomegaly or masses felt.  Normal bowel sounds heard. Central nervous system: Alert and oriented. No focal neurological deficits. Extremities:  No cyanosis, no clubbing,  Pitting edema+ noted Skin: No rashes, lesions or ulcers Psychiatry: Judgement and insight appear normal. Mood & affect appropriate.     Data Reviewed: I have personally reviewed following labs and imaging studies  CBC: Recent Labs  Lab 09/18/19 1811 09/19/19 0341 09/22/19 0736 09/23/19 0455 09/24/19 0247  WBC 6.8 6.0 7.1 7.1 8.3  NEUTROABS 3.7  --   --   --   --   HGB 12.6* 12.7* 11.7* 12.3* 12.2*  HCT 34.8* 34.6* 33.4* 33.8* 34.2*  MCV 96.7 94.3 96.5 96.6 95.5  PLT 103* 115* 117* 132* 570*   Basic Metabolic Panel: Recent Labs  Lab 09/20/19 1855 09/21/19 0214 09/22/19 0736 09/23/19 0455 09/24/19 0247  NA 127* 126* 128* 131* 133*  K 4.2 4.2 3.9 4.7 4.7  CL 96* 96* 100 100 100  CO2 20* 20* 19* 21* 20*  GLUCOSE 137* 95 102* 135* 132*  BUN 34* 38* 53* 63* 75*  CREATININE 3.54* 4.01* 4.40* 5.05* 4.92*  CALCIUM 8.0* 8.0* 8.0* 8.2* 8.3*  MG  --   --  2.1 2.3 2.4  PHOS  --   --  6.2* 7.1* 6.3*   GFR: Estimated Creatinine Clearance: 16.3 mL/min (A) (by C-G formula based on SCr of 4.92 mg/dL (H)). Liver Function Tests: Recent Labs  Lab 09/23/19 0455 09/24/19 0247  AST 136* 106*  ALT 13 17  ALKPHOS 201* 183*  BILITOT 6.4* 5.7*  PROT 7.1 6.8  ALBUMIN 1.7* 1.7*   No results for input(s): LIPASE, AMYLASE in the last 168 hours. No results for input(s): AMMONIA in the last 168 hours. Coagulation Profile: Recent Labs  Lab 09/18/19 1811  INR 1.3*   Cardiac Enzymes: Recent Labs  Lab 09/23/19 0455  CKTOTAL 149   BNP (last 3 results) No results for input(s): PROBNP in the last 8760 hours. HbA1C: No results for input(s): HGBA1C in the  last 72 hours. CBG: No results for input(s): GLUCAP in the last 168 hours. Lipid Profile: No results for input(s):  CHOL, HDL, LDLCALC, TRIG, CHOLHDL, LDLDIRECT in the last 72 hours. Thyroid Function Tests: Recent Labs    09/22/19 0736  TSH 0.506   Anemia Panel: No results for input(s): VITAMINB12, FOLATE, FERRITIN, TIBC, IRON, RETICCTPCT in the last 72 hours. Sepsis Labs: No results for input(s): PROCALCITON, LATICACIDVEN in the last 168 hours.  Recent Results (from the past 240 hour(s))  SARS Coronavirus 2 by RT PCR (hospital order, performed in [facility] hospital lab) Nasopharyngeal Nasopharyngeal Swab     Status: None   Collection Time: 09/18/19  6:00 PM   Specimen: Nasopharyngeal Swab  Result Value Ref Range Status   SARS Coronavirus 2 NEGATIVE NEGATIVE Final    Comment: (NOTE) SARS-CoV-2 target nucleic acids are NOT DETECTED.  The SARS-CoV-2 RNA is generally detectable in upper and lower respiratory specimens during the acute phase of infection. The lowest concentration of SARS-CoV-2 viral copies this assay can detect is 250 copies / mL. A negative result does not preclude SARS-CoV-2 infection and should not be used as the sole basis for treatment or other patient management decisions.  A negative result may occur with improper specimen collection / handling, submission of specimen other than nasopharyngeal swab, presence of viral mutation(s) within the areas targeted by this assay, and inadequate number of viral copies (<250 copies / mL). A negative result must be combined with clinical observations, patient history, and epidemiological information.  Fact Sheet for Patients:   StrictlyIdeas.no  Fact Sheet for Healthcare Providers: BankingDealers.co.za  This test is not yet approved or  cleared by the [place] FDA and has been authorized for detection and/or diagnosis of SARS-CoV-2 by FDA under an Emergency Use  Authorization (EUA).  This EUA will remain in effect (meaning this test can be used) for the duration of the COVID-19 declaration under Section 564(b)(1) of the Act, 21 U.S.C. section 360bbb-3(b)(1), unless the authorization is terminated or revoked sooner.  Performed at [facility], [place] [address] [place], [place] [address]      Radiology Studies: No results found.  Scheduled Meds: . carbidopa-levodopa  0.5 tablet Oral BID  . Chlorhexidine Gluconate Cloth  6 each Topical Daily  . [START ON 09/25/2019] doxycycline  100 mg Oral Q12H  . DULoxetine  60 mg Oral Daily  . finasteride  5 mg Oral Daily  . gatifloxacin  1 drop Left Eye QID  . hydrocortisone sod succinate (SOLU-CORTEF) inj  100 mg Intravenous Q8H  . [START ON 09/25/2019] levofloxacin  500 mg Oral Q48H  . loratadine  10 mg Oral Daily  . mirabegron ER  50 mg Oral Daily  . neomycin-polymyxin b-dexamethasone  1 application Left Eye QID  . prednisoLONE acetate  1 drop Left Eye QID  . senna-docusate  1 tablet Oral BID   Continuous Infusions: . cefTAZidime (FORTAZ)  IV 1 g (09/23/19 2137)     LOS: 6 days    Time spent:  25 mins.    Shawna Clamp, MD [facility]   If 7PM-7AM, please contact night-coverage

## 2019-09-24 NOTE — Progress Notes (Signed)
Patient has 638mL of urinary rentention, RN notified MD. MD placed order for in and out catheter which was unsuccessful due to increasing resistance, RN notified MD. MD reported that they will place order for a foley catheter. RN will continue to monitor the patient while waiting for completion of this order.

## 2019-09-24 NOTE — Progress Notes (Signed)
Patient ID: Mory Herrman, male   DOB: 04-09-1945, 74 y.o.   MRN: [id number] S: No new complaints but PVR is rising and will need foley catheter O:BP 129/82 (BP Location: Right Arm)   Pulse 91   Temp 97.8 F (36.6 C) (Oral)   Resp 18   Ht 5\' 9"  (1.753 m)   Wt 109.3 kg   SpO2 100%   BMI 35.59 kg/m   Intake/Output Summary (Last 24 hours) at 09/24/2019 1442 Last data filed at 09/24/2019 1100 Gross per 24 hour  Intake 691.07 ml  Output 1350 ml  Net -658.93 ml   Intake/Output: I/O last 3 completed shifts: In: 946.1 [P.O.:630; IV Piggyback:316.1] Out: 1950 [XBJYN:8295]  Intake/Output this shift:  Total I/O In: 355 [P.O.:355] Out: 250 [Urine:250] Weight change:  Gen: NAD CVS: RRR, no rub Resp: cta Abd: benign Ext: trace LLE and LUE edema  Recent Labs  Lab 09/19/19 0341 09/20/19 0737 09/20/19 1855 09/21/19 0214 09/22/19 0736 09/23/19 0455 09/24/19 0247  NA 125* 127* 127* 126* 128* 131* 133*  K 4.0 4.6 4.2 4.2 3.9 4.7 4.7  CL 94* 95* 96* 96* 100 100 100  CO2 18* 21* 20* 20* 19* 21* 20*  GLUCOSE 133* 107* 137* 95 102* 135* 132*  BUN 8 25* 34* 38* 53* 63* 75*  CREATININE 0.79 2.71* 3.54* 4.01* 4.40* 5.05* 4.92*  ALBUMIN  --   --   --   --   --  1.7* 1.7*  CALCIUM 8.1* 8.2* 8.0* 8.0* 8.0* 8.2* 8.3*  PHOS  --   --   --   --  6.2* 7.1* 6.3*  AST  --   --   --   --   --  136* 106*  ALT  --   --   --   --   --  13 17   Liver Function Tests: Recent Labs  Lab 09/23/19 0455 09/24/19 0247  AST 136* 106*  ALT 13 17  ALKPHOS 201* 183*  BILITOT 6.4* 5.7*  PROT 7.1 6.8  ALBUMIN 1.7* 1.7*   No results for input(s): LIPASE, AMYLASE in the last 168 hours. No results for input(s): AMMONIA in the last 168 hours. CBC: Recent Labs  Lab 09/18/19 1811 09/18/19 1811 09/19/19 0341 09/19/19 0341 09/22/19 0736 09/23/19 0455 09/24/19 0247  WBC 6.8   < > 6.0   < > 7.1 7.1 8.3  NEUTROABS 3.7  --   --   --   --   --   --   HGB 12.6*   < > 12.7*   < > 11.7* 12.3* 12.2*  HCT  34.8*   < > 34.6*   < > 33.4* 33.8* 34.2*  MCV 96.7  --  94.3  --  96.5 96.6 95.5  PLT 103*   < > 115*   < > 117* 132* 113*   < > = values in this interval not displayed.   Cardiac Enzymes: Recent Labs  Lab 09/23/19 0455  CKTOTAL 149   CBG: No results for input(s): GLUCAP in the last 168 hours.  Iron Studies: No results for input(s): IRON, TIBC, TRANSFERRIN, FERRITIN in the last 72 hours. Studies/Results: No results found. . carbidopa-levodopa  0.5 tablet Oral BID  . [START ON 09/25/2019] doxycycline  100 mg Oral Q12H  . DULoxetine  60 mg Oral Daily  . finasteride  5 mg Oral Daily  . gatifloxacin  1 drop Left Eye QID  . hydrocortisone sod succinate (SOLU-CORTEF) inj  100 mg  Intravenous Q8H  . [START ON 09/25/2019] levofloxacin  500 mg Oral Q48H  . loratadine  10 mg Oral Daily  . mirabegron ER  50 mg Oral Daily  . neomycin-polymyxin b-dexamethasone  1 application Left Eye QID  . prednisoLONE acetate  1 drop Left Eye QID  . senna-docusate  1 tablet Oral BID    BMET    Component Value Date/Time   NA 133 (L) 09/24/2019 0247   K 4.7 09/24/2019 0247   CL 100 09/24/2019 0247   CO2 20 (L) 09/24/2019 0247   GLUCOSE 132 (H) 09/24/2019 0247   BUN 75 (H) 09/24/2019 0247   CREATININE 4.92 (H) 09/24/2019 0247   CALCIUM 8.3 (L) 09/24/2019 0247   GFRNONAA 11 (L) 09/24/2019 0247   GFRAA 13 (L) 09/24/2019 0247   CBC    Component Value Date/Time   WBC 8.3 09/24/2019 0247   RBC 3.58 (L) 09/24/2019 0247   HGB 12.2 (L) 09/24/2019 0247   HCT 34.2 (L) 09/24/2019 0247   PLT 113 (L) 09/24/2019 0247   MCV 95.5 09/24/2019 0247   MCH 34.1 (H) 09/24/2019 0247   MCHC 35.7 09/24/2019 0247   RDW 18.5 (H) 09/24/2019 0247   LYMPHSABS 1.8 09/18/2019 1811   MONOABS 0.9 09/18/2019 1811   EOSABS 0.2 09/18/2019 1811   BASOSABS 0.1 09/18/2019 1811    Assessment/Plan:  1. AKI, non-oliguric, multifactorial, in setting of hemodynamically mediated ischemic ATN (hypotension, hypovolemia, IV vanco  (trough of 35 on 09/20/19), and urinary retention in setting of ACE inhibitor).  UOP has been increasing but will need foley catheter placement due to BOO. 1. Continue to follow UOP and daily BUN/Cr 2. No uremic symptoms or indication for urgent dialysis. 2. 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  ophthamology performed surgical repair on 9/16. 3. HTN- on low side and taken off of lisinopril 9/18. 4. Hyponatremia- resolved 5. Urinary retention- for foley placement today. 6. Abnormal LFT's- increased t. Bilirubin and elevated AST.  Continue to follow LFT's 7. Constipation- per pt no BM since last Wednesday.  Consider laxatives. Per primary.  Donetta Potts, MD [employer] [phone]

## 2019-09-24 NOTE — Progress Notes (Signed)
Physical Therapy Treatment Patient Details Name: Nicholas Ramirez MRN: [id number] DOB: 1945/02/15 Today's Date: 09/24/2019    History of Present Illness The pt is a 74 yo male presenting after a fall at home that resulted in injury to his L eye (rupture of anterior left globe). PMH includes: HTN, heart murmur, cataracts, hepatitis C, and ongoing workup for Parkinson's Disease with outpatient neurology.    PT Comments    Patient progressing slowly towards PT goals. Continues to require Max A for bed mobility and Mod A of 2 for standing. Pt demonstrates retropulsion upon sitting EOB and with standing. Able to self correct with cues but not sustain. Able to initiate gait training with Mod A of 2, with assist for weight shifting, balance and advancing LLE. Increased tone in LUE. Motivated to maximize independence. Continue to recommend CIR. Will follow.    Follow Up Recommendations  CIR     Equipment Recommendations  Other (comment) (defer)    Recommendations for Other Services       Precautions / Restrictions Precautions Precautions: Fall Precaution Comments: pt admitted for a fall, daughter reports 15-20 in last 6 months Restrictions Weight Bearing Restrictions: No    Mobility  Bed Mobility Overal bed mobility: Needs Assistance Bed Mobility: Supine to Sit     Supine to sit: HOB elevated;Max assist     General bed mobility comments: Assist with LLE, trunk and scooting bottom to EOB with increased time.Retropulsion initially which improved with cues.  Transfers Overall transfer level: Needs assistance Equipment used: 2 person hand held assist Transfers: Sit to/from Stand Sit to Stand: Mod assist;+2 physical assistance;+2 safety/equipment         General transfer comment: Assist of 2 to power to standing with cues for anterior translation; tendency for retropulsion. Transferred to chair post ambulation.  Ambulation/Gait Ambulation/Gait assistance: Mod assist;+2  safety/equipment Gait Distance (Feet): 8 Feet Assistive device: 2 person hand held assist;1 person hand held assist Gait Pattern/deviations: Step-to pattern;Shuffle;Wide base of support Gait velocity: decreased   General Gait Details: Slow, unsteady gait with wide BoS, difficulty advancing LLE, assist with weight shifting to advance LEs, posterior lean.   Stairs             Wheelchair Mobility    Modified Rankin (Stroke Patients Only)       Balance Overall balance assessment: Needs assistance Sitting-balance support: Feet supported;No upper extremity supported Sitting balance-Leahy Scale: Fair Sitting balance - Comments: fluctates due to posterior lean, min-mod assist but preference to at least 1 UE support Postural control: Posterior lean Standing balance support: During functional activity Standing balance-Leahy Scale: Poor Standing balance comment: relies on external supportdue to posterior lean                            Cognition Arousal/Alertness: Awake/alert Behavior During Therapy: WFL for tasks assessed/performed Overall Cognitive Status: Impaired/Different from baseline Area of Impairment: Safety/judgement;Problem solving;Attention;Following commands;Awareness                   Current Attention Level: Sustained   Following Commands: Follows one step commands consistently;Follows one step commands with increased time;Follows multi-step commands inconsistently Safety/Judgement: Decreased awareness of safety;Decreased awareness of deficits Awareness: Emergent Problem Solving: Slow processing;Decreased initiation;Difficulty sequencing;Requires tactile cues;Requires verbal cues General Comments: patient following commands but requires increased time and multimodal cueing for multistep commands during functional tasks, fair insight to deficts but requires constant redirection to tasks. Tangential.  Exercises      General Comments         Pertinent Vitals/Pain Pain Assessment: Faces Faces Pain Scale: Hurts little more Pain Location: "everywhere" Pain Descriptors / Indicators: Sore Pain Intervention(s): Monitored during session;Repositioned    Home Living                      Prior Function            PT Goals (current goals can now be found in the care plan section) Progress towards PT goals: Progressing toward goals    Frequency    Min 3X/week      PT Plan Current plan remains appropriate    Co-evaluation              AM-PAC PT "6 Clicks" Mobility   Outcome Measure  Help needed turning from your back to your side while in a flat bed without using bedrails?: A Little Help needed moving from lying on your back to sitting on the side of a flat bed without using bedrails?: A Lot Help needed moving to and from a bed to a chair (including a wheelchair)?: A Lot Help needed standing up from a chair using your arms (e.g., wheelchair or bedside chair)?: A Lot Help needed to walk in hospital room?: A Lot Help needed climbing 3-5 steps with a railing? : Total 6 Click Score: 12    End of Session Equipment Utilized During Treatment: Gait belt Activity Tolerance: Patient tolerated treatment well Patient left: in chair;with call bell/phone within reach;with chair alarm set Nurse Communication: Mobility status PT Visit Diagnosis: Muscle weakness (generalized) (M62.81);History of falling (Z91.81);Repeated falls (R29.6);Difficulty in walking, not elsewhere classified (R26.2)     Time: 7116-5790 PT Time Calculation (min) (ACUTE ONLY): 23 min  Charges:  $Therapeutic Activity: 8-22 mins $Neuromuscular Re-education: 8-22 mins                     [name], PT, DPT Acute Rehabilitation Services Pager [phone] Office [phone]       [name] 09/24/2019, 1:04 PM

## 2019-09-25 LAB — COMPREHENSIVE METABOLIC PANEL
ALT: 25 U/L (ref 0–44)
AST: 99 U/L — ABNORMAL HIGH (ref 15–41)
Albumin: 1.5 g/dL — ABNORMAL LOW (ref 3.5–5.0)
Alkaline Phosphatase: 155 U/L — ABNORMAL HIGH (ref 38–126)
Anion gap: 10 (ref 5–15)
BUN: 82 mg/dL — ABNORMAL HIGH (ref 8–23)
CO2: 20 mmol/L — ABNORMAL LOW (ref 22–32)
Calcium: 8.3 mg/dL — ABNORMAL LOW (ref 8.9–10.3)
Chloride: 106 mmol/L (ref 98–111)
Creatinine, Ser: 4.28 mg/dL — ABNORMAL HIGH (ref 0.61–1.24)
GFR calc Af Amer: 15 mL/min — ABNORMAL LOW (ref >60)
GFR calc non Af Amer: 13 mL/min — ABNORMAL LOW (ref >60)
Glucose, Bld: 128 mg/dL — ABNORMAL HIGH (ref 70–99)
Potassium: 4.5 mmol/L (ref 3.5–5.1)
Sodium: 136 mmol/L (ref 135–145)
Total Bilirubin: 5.1 mg/dL — ABNORMAL HIGH (ref 0.3–1.2)
Total Protein: 6.5 g/dL (ref 6.5–8.1)

## 2019-09-25 LAB — HEMOGLOBIN AND HEMATOCRIT, BLOOD
HCT: 32.9 % — ABNORMAL LOW (ref 39.0–52.0)
Hemoglobin: 11.9 g/dL — ABNORMAL LOW (ref 13.0–17.0)

## 2019-09-25 LAB — PHOSPHORUS: Phosphorus: 5.6 mg/dL — ABNORMAL HIGH (ref 2.5–4.6)

## 2019-09-25 LAB — MAGNESIUM: Magnesium: 2.3 mg/dL (ref 1.7–2.4)

## 2019-09-25 NOTE — Progress Notes (Signed)
Patient ID: Nicholas Ramirez, male   DOB: 03-Apr-1945, 74 y.o.   MRN: [id number] S: No new complaints O:BP 127/78 (BP Location: Right Arm)   Pulse 92   Temp 98.4 F (36.9 C) (Oral)   Resp 18   Ht 5\' 9"  (1.753 m)   Wt 101.6 kg   SpO2 96%   BMI 33.08 kg/m   Intake/Output Summary (Last 24 hours) at 09/25/2019 1159 Last data filed at 09/25/2019 0800 Gross per 24 hour  Intake 536.52 ml  Output 2500 ml  Net -1963.48 ml   Intake/Output: I/O last 3 completed shifts: In: 1057.6 [P.O.:755; IV Piggyback:302.6] Out: 2850 [Urine:2850]  Intake/Output this shift:  Total I/O In: 250 [P.O.:250] Out: -  Weight change:  Gen: NAD CVS: rrr Resp: cta Abd: +BS, soft, NT/ND Ext: trace edema  Recent Labs  Lab 09/20/19 0737 09/20/19 1855 09/21/19 0214 09/22/19 0736 09/23/19 0455 09/24/19 0247 09/25/19 0618  NA 127* 127* 126* 128* 131* 133* 136  K 4.6 4.2 4.2 3.9 4.7 4.7 4.5  CL 95* 96* 96* 100 100 100 106  CO2 21* 20* 20* 19* 21* 20* 20*  GLUCOSE 107* 137* 95 102* 135* 132* 128*  BUN 25* 34* 38* 53* 63* 75* 82*  CREATININE 2.71* 3.54* 4.01* 4.40* 5.05* 4.92* 4.28*  ALBUMIN  --   --   --   --  1.7* 1.7* 1.5*  CALCIUM 8.2* 8.0* 8.0* 8.0* 8.2* 8.3* 8.3*  PHOS  --   --   --  6.2* 7.1* 6.3* 5.6*  AST  --   --   --   --  136* 106* 99*  ALT  --   --   --   --  13 17 25    Liver Function Tests: Recent Labs  Lab 09/23/19 0455 09/24/19 0247 09/25/19 0618  AST 136* 106* 99*  ALT 13 17 25   ALKPHOS 201* 183* 155*  BILITOT 6.4* 5.7* 5.1*  PROT 7.1 6.8 6.5  ALBUMIN 1.7* 1.7* 1.5*   No results for input(s): LIPASE, AMYLASE in the last 168 hours. No results for input(s): AMMONIA in the last 168 hours. CBC: Recent Labs  Lab 09/18/19 1811 09/18/19 1811 09/19/19 0341 09/19/19 0341 09/22/19 0736 09/22/19 0736 09/23/19 0455 09/24/19 0247 09/25/19 0618  WBC 6.8   < > 6.0   < > 7.1  --  7.1 8.3  --   NEUTROABS 3.7  --   --   --   --   --   --   --   --   HGB 12.6*   < > 12.7*   < >  11.7*   < > 12.3* 12.2* 11.9*  HCT 34.8*   < > 34.6*   < > 33.4*   < > 33.8* 34.2* 32.9*  MCV 96.7  --  94.3  --  96.5  --  96.6 95.5  --   PLT 103*   < > 115*   < > 117*  --  132* 113*  --    < > = values in this interval not displayed.   Cardiac Enzymes: Recent Labs  Lab 09/23/19 0455  CKTOTAL 149   CBG: No results for input(s): GLUCAP in the last 168 hours.  Iron Studies: No results for input(s): IRON, TIBC, TRANSFERRIN, FERRITIN in the last 72 hours. Studies/Results: No results found. . carbidopa-levodopa  0.5 tablet Oral BID  . Chlorhexidine Gluconate Cloth  6 each Topical Daily  . doxycycline  100 mg Oral Q12H  .  DULoxetine  60 mg Oral Daily  . finasteride  5 mg Oral Daily  . gatifloxacin  1 drop Left Eye QID  . hydrocortisone sod succinate (SOLU-CORTEF) inj  100 mg Intravenous Q8H  . levofloxacin  500 mg Oral Q48H  . loratadine  10 mg Oral Daily  . mirabegron ER  50 mg Oral Daily  . neomycin-polymyxin b-dexamethasone  1 application Left Eye QID  . prednisoLONE acetate  1 drop Left Eye QID  . senna-docusate  1 tablet Oral BID    BMET    Component Value Date/Time   NA 136 09/25/2019 0618   K 4.5 09/25/2019 0618   CL 106 09/25/2019 0618   CO2 20 (L) 09/25/2019 0618   GLUCOSE 128 (H) 09/25/2019 0618   BUN 82 (H) 09/25/2019 0618   CREATININE 4.28 (H) 09/25/2019 0618   CALCIUM 8.3 (L) 09/25/2019 0618   GFRNONAA 13 (L) 09/25/2019 0618   GFRAA 15 (L) 09/25/2019 0618   CBC    Component Value Date/Time   WBC 8.3 09/24/2019 0247   RBC 3.58 (L) 09/24/2019 0247   HGB 11.9 (L) 09/25/2019 0618   HCT 32.9 (L) 09/25/2019 0618   PLT 113 (L) 09/24/2019 0247   MCV 95.5 09/24/2019 0247   MCH 34.1 (H) 09/24/2019 0247   MCHC 35.7 09/24/2019 0247   RDW 18.5 (H) 09/24/2019 0247   LYMPHSABS 1.8 09/18/2019 1811   MONOABS 0.9 09/18/2019 1811   EOSABS 0.2 09/18/2019 1811   BASOSABS 0.1 09/18/2019 1811    Assessment/Plan:  1. AKI, non-oliguric, multifactorial, in setting  of hemodynamically mediated ischemic ATN (hypotension, hypovolemia, IV vanco (trough of 35 on 09/20/19), and urinary retention in setting of ACE inhibitor).  1. UOP has significantly increased after foley catheter placement due to BOO. 2. Continue to follow UOP and daily BUN/Cr 3. No uremic symptoms or indication for urgent dialysis. 4. Net negative 2 liters and may be having post-obstructive diuresis.  If continues may need to start IVF's to prevent ATN. 2. 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 ophthamology performed surgical repair on 9/16. 3. HTN- on low side and taken off of lisinopril 9/18. 4. Hyponatremia- resolved 5. Urinary retention- s/p Foley catheter 09/24/19. 6. Abnormal LFT's- increased t. Bilirubin and elevated AST. Continue to follow LFT's 7. Constipation- per pt no BM since last Wednesday. Consider laxatives. Per primary.  Donetta Potts, MD [employer] [phone]

## 2019-09-25 NOTE — Progress Notes (Signed)
Pt. A&O x 4. Heart and lung sounds clear. Total care,  He is 2+ person assist. Tolerates meals and fluids well. q2h turns in bed. Pt. Left eye is intermittently drains blood.  Foley & pericare completed. CHG bath & oral care completed. Weakness/immobilty in left arm due to carpal tunnel.   Pt. Still has not had a BM, bowels are active and flatulence occur several times t/o shift.   Visitor: Daughter came in @ 0700 and still here.   Deanna (daughter) & Lelon Frohlich (wife) are his designated visitor.

## 2019-09-25 NOTE — Progress Notes (Signed)
Occupational Therapy Treatment Patient Details Name: Nicholas Ramirez MRN: [id number] DOB: 1945-07-02 Today's Date: 09/25/2019    History of present illness The pt is a 74 yo male presenting after a fall at home that resulted in injury to his L eye (rupture of anterior left globe). PMH includes: HTN, heart murmur, cataracts, hepatitis C, and ongoing workup for Parkinson's Disease with outpatient neurology.   OT comments  Pt seen for follow up OT session with focus on ADL mobility progression, neuro re-education techniques, and cognition. Pt completed bed mobility with mod-max A +2 to support L side. Once sitting EOB pt able to maintain sitting with fluctuating levels of assist due to L lateral/posterior lean. Pt with improved awareness today of deficits, able to recall when he is leaning and started to begin self correction. Charlaine Dalton was then used to work on standing and appropriate posture. Pt completed sit <> stands in stedy with min A +2. Increased support used on L side to support LUE in normal movement patterns and weightbearing. Placed stedy in front of sink for visual feed back. Assisted pt with UE stretch/exercise as listed below. Pt has L palm protector, noted increased edema. Removed palm protector and informed RN staff to reapply later this evening. Edema massage performed and LUE propped on pillows. Pt remains excellent candidate for CIR. Will continue to follow per POC listed below.     Follow Up Recommendations  CIR;Supervision/Assistance - 24 hour    Equipment Recommendations  Other (comment) (TBD at next venue of care)    Recommendations for Other Services Rehab consult    Precautions / Restrictions Precautions Precautions: Fall Precaution Comments: pt admitted for a fall, daughter reports 15-20 in last 6 months Restrictions Weight Bearing Restrictions: No       Mobility Bed Mobility Overal bed mobility: Needs Assistance Bed Mobility: Supine to Sit;Sit to Supine      Supine to sit: Mod assist;+2 for physical assistance;+2 for safety/equipment Sit to supine: Max assist;+2 for physical assistance;+2 for safety/equipment   General bed mobility comments: performed transfer to strong R side. increased time and cueing needed to utilize rails. assist for BLE translation to EOB with additional truncal support. Increased assist of max A +2 back to bed. Pt with difficulty controlling trunk with return to supine position  Transfers Overall transfer level: Needs assistance Equipment used: Ambulation equipment used Transfers: Sit to/from Stand Sit to Stand: Min assist;+2 physical assistance;+2 safety/equipment         General transfer comment: assist to power into standing in stedy frame. Increased assist/facilitation used on L side due to LUE weakness. Multimodal cues used for appropriate posture    Balance Overall balance assessment: Needs assistance Sitting-balance support: Feet supported;No upper extremity supported Sitting balance-Leahy Scale: Fair Sitting balance - Comments: fluctates due to posterior lean, min-mod assist but preference to at least 1 UE support Postural control: Posterior lean Standing balance support: During functional activity Standing balance-Leahy Scale: Poor Standing balance comment: relies on external support due to posterior lean                           ADL either performed or assessed with clinical judgement   ADL Overall ADL's : Needs assistance/impaired                         Toilet Transfer: Minimal assistance;+2 for physical assistance;+2 for safety/equipment Toilet Transfer Details (indicate cue type and reason): with  use of stedy. Pt able to rise into standing with multimodal cues for appropriate posture (tendency to assume L lateral lean)         Functional mobility during ADLs: Minimal assistance;+2 for physical assistance;+2 for safety/equipment (with use of stedy) General ADL Comments:  pt able to progress in ADL mobility with use of stedy for OOB activities.     Vision   Additional Comments: L eye continues to have shield in place with notable bloody drainage. Pt reports eye pain with this as well. Needs multimodal cues to track to the L, states he is not seeing his baseline with this eye   Perception     Praxis      Cognition Arousal/Alertness: Awake/alert Behavior During Therapy: WFL for tasks assessed/performed Overall Cognitive Status: Impaired/Different from baseline Area of Impairment: Attention;Following commands;Safety/judgement;Awareness;Problem solving                   Current Attention Level: Selective   Following Commands: Follows multi-step commands inconsistently;Follows one step commands with increased time Safety/Judgement: Decreased awareness of safety;Decreased awareness of deficits Awareness: Emergent Problem Solving: Slow processing;Decreased initiation;Difficulty sequencing;Requires tactile cues;Requires verbal cues General Comments: pt with improved awareness today, able to state "I know when I am leaning". Continues to require cues for problem solving basic mobility/ADL tasks.        Exercises Other Exercises Other Exercises: UE: cross body reach with spinal twisex5 per side; scapular mobs x5 bilaterally; LUE ranging of shoulder, elbow wrist, hand Other Exercises: LUE: edema massage and skin check. Palm protector in place with notable edema.   Shoulder Instructions       General Comments      Pertinent Vitals/ Pain       Pain Assessment: Faces Faces Pain Scale: Hurts a little bit Pain Location: "everywhere" mostly L eye Pain Descriptors / Indicators: Aching;Sore Pain Intervention(s): Monitored during session;Repositioned  Home Living                                          Prior Functioning/Environment              Frequency  Min 2X/week        Progress Toward Goals  OT Goals(current  goals can now be found in the care plan section)  Progress towards OT goals: Progressing toward goals  Acute Rehab OT Goals Patient Stated Goal: fall less OT Goal Formulation: With patient Time For Goal Achievement: 10/04/19 Potential to Achieve Goals: Good  Plan Discharge plan remains appropriate    Co-evaluation      Reason for Co-Treatment: For patient/therapist safety;To address functional/ADL transfers   OT goals addressed during session: ADL's and self-care;Strengthening/ROM;Proper use of Adaptive equipment and DME      AM-PAC OT "6 Clicks" Daily Activity     Outcome Measure   Help from another person eating meals?: A Little Help from another person taking care of personal grooming?: A Little Help from another person toileting, which includes using toliet, bedpan, or urinal?: A Lot Help from another person bathing (including washing, rinsing, drying)?: A Lot Help from another person to put on and taking off regular upper body clothing?: A Lot Help from another person to put on and taking off regular lower body clothing?: A Lot 6 Click Score: 14    End of Session Equipment Utilized During Treatment: Gait belt;Other (comment) (stedy)  OT  Visit Diagnosis: Other abnormalities of gait and mobility (R26.89);Muscle weakness (generalized) (M62.81);Repeated falls (R29.6);Other symptoms and signs involving cognitive function   Activity Tolerance Patient tolerated treatment well   Patient Left in bed;with call bell/phone within reach;with family/visitor present   Nurse Communication Mobility status        Time: 1257-1340 OT Time Calculation (min): 43 min  Charges: OT General Charges $OT Visit: 1 Visit OT Treatments $Self Care/Home Management : 23-37 mins  Zenovia Jarred, MSOT, OTR/L [facility] Office Number: [phone] Pager: [phone]  Zenovia Jarred 09/25/2019, 2:22 PM

## 2019-09-25 NOTE — TOC Progression Note (Signed)
Transition of Care [facility]) - Progression Note    Patient Details  Name: Nicholas Ramirez MRN: [id number] Date of Birth: 1945-03-14  Transition [facility]) CM/SW Contact  Bartholomew Crews, RN Phone Number: [phone] 09/25/2019, 4:18 PM  Clinical Narrative:     Received call from [facility]. Medical director is requesting peer to peer in order to make the best decision for this patient. MD to call [facility] at [phone], option 5 before 10:30 am 9/23. Dr. Dwyane Dee aware.       Expected Discharge Plan and Services                                                 Social Determinants of Health (SDOH) Interventions    Readmission Risk Interventions No flowsheet data found.

## 2019-09-25 NOTE — Progress Notes (Signed)
Physical Therapy Treatment Patient Details Name: Nicholas Ramirez MRN: [id number] DOB: 1945/02/28 Today's Date: 09/25/2019    History of Present Illness The pt is a 74 yo male presenting after a fall at home that resulted in injury to his L eye (rupture of anterior left globe). PMH includes: HTN, heart murmur, cataracts, hepatitis C, and ongoing workup for Parkinson's Disease with outpatient neurology.    PT Comments    Continuing work on functional mobility and activity tolerance;  Co-session with OT for safety and the ability to do more work in upright sitting and standing;  Pt completed bed mobility with mod-max A +2 to support L side. Once sitting EOB pt able to maintain sitting with fluctuating levels of assist due to L lateral/posterior lean. Pt with improved awareness today of deficits, able to recall when he is leaning and started to begin self correction. Charlaine Dalton was then used to work on standing and appropriate posture. Pt completed sit <> stands in stedy with min A +2. Increased support used on L side to support LUE in normal movement patterns and weightbearing. Placed stedy in front of sink for visual feed back; Ended PT session with pt in stedy at sink with OT continuing work; Continue to recommend comprehensive inpatient rehab (CIR) for post-acute therapy needs.    Follow Up Recommendations  CIR     Equipment Recommendations  Rolling walker with 5" wheels;3in1 (PT)    Recommendations for Other Services OT consult;Rehab consult     Precautions / Restrictions Precautions Precautions: Fall Precaution Comments: pt admitted for a fall, daughter reports 15-20 in last 6 months Restrictions Weight Bearing Restrictions: No    Mobility  Bed Mobility Overal bed mobility: Needs Assistance Bed Mobility: Supine to Sit     Supine to sit: Mod assist;+2 for physical assistance;+2 for safety/equipment Sit to supine: Max assist;+2 for physical assistance;+2 for safety/equipment    General bed mobility comments: performed transfer to strong R side. increased time and cueing needed to utilize rails. assist for BLE translation to EOB with additional truncal support. Increased assist of max A +2 back to bed. Pt with difficulty controlling trunk with return to supine position  Transfers Overall transfer level: Needs assistance Equipment used: Ambulation equipment used Transfers: Sit to/from Stand Sit to Stand: Min assist;+2 physical assistance;+2 safety/equipment         General transfer comment: assist to power into standing in stedy frame. Increased assist/facilitation used on L side due to LUE weakness. Multimodal cues used for appropriate posture  Ambulation/Gait Ambulation/Gait assistance: Min assist Gait Distance (Feet):  (March in place in stedy) Assistive device: 2 person hand held assist;1 person hand held assist       General Gait Details: Multimodal cueing for full weight shifts onto stance leg to allow for lifting   Stairs             Wheelchair Mobility    Modified Rankin (Stroke Patients Only)       Balance Overall balance assessment: Needs assistance Sitting-balance support: Feet supported;No upper extremity supported Sitting balance-Leahy Scale: Fair Sitting balance - Comments: fluctates due to posterior lean, min-mod assist but preference to at least 1 UE support Postural control: Posterior lean Standing balance support: During functional activity Standing balance-Leahy Scale: Poor Standing balance comment: relies on external support due to posterior lean                            Cognition Arousal/Alertness:  Awake/alert Behavior During Therapy: WFL for tasks assessed/performed Overall Cognitive Status: Impaired/Different from baseline Area of Impairment: Attention;Following commands;Safety/judgement;Awareness;Problem solving                   Current Attention Level: Selective   Following Commands:  Follows multi-step commands inconsistently;Follows one step commands with increased time Safety/Judgement: Decreased awareness of safety;Decreased awareness of deficits Awareness: Emergent Problem Solving: Slow processing;Decreased initiation;Difficulty sequencing;Requires tactile cues;Requires verbal cues General Comments: pt with improved awareness today, able to state "I know when I am leaning". Continues to require cues for problem solving basic mobility/ADL tasks.      Exercises Other Exercises Other Exercises: UE: cross body reach with spinal twisex5 per side; scapular mobs x5 bilaterally; LUE ranging of shoulder, elbow wrist, hand Other Exercises: LUE: edema massage and skin check. Palm protector in place with notable edema.    General Comments General comments (skin integrity, edema, etc.): Daughter present and supportive during session      Pertinent Vitals/Pain Pain Assessment: Faces Faces Pain Scale: Hurts a little bit Pain Location: "everywhere" mostly L eye Pain Descriptors / Indicators: Aching;Sore Pain Intervention(s): Monitored during session    Home Living                      Prior Function            PT Goals (current goals can now be found in the care plan section) Acute Rehab PT Goals Patient Stated Goal: fall less PT Goal Formulation: With patient/family Time For Goal Achievement: 10/03/19 Potential to Achieve Goals: Good Progress towards PT goals: Progressing toward goals    Frequency    Min 3X/week      PT Plan Current plan remains appropriate    Co-evaluation PT/OT/SLP Co-Evaluation/Treatment: Yes Reason for Co-Treatment: For patient/therapist safety;To address functional/ADL transfers PT goals addressed during session: Mobility/safety with mobility OT goals addressed during session: ADL's and self-care;Strengthening/ROM;Proper use of Adaptive equipment and DME      AM-PAC PT "6 Clicks" Mobility   Outcome Measure  Help needed  turning from your back to your side while in a flat bed without using bedrails?: A Little Help needed moving from lying on your back to sitting on the side of a flat bed without using bedrails?: A Lot Help needed moving to and from a bed to a chair (including a wheelchair)?: A Lot Help needed standing up from a chair using your arms (e.g., wheelchair or bedside chair)?: A Lot Help needed to walk in hospital room?: A Lot Help needed climbing 3-5 steps with a railing? : Total 6 Click Score: 12    End of Session Equipment Utilized During Treatment: Gait belt Activity Tolerance: Patient tolerated treatment well Patient left: Other (comment) (Continuing work with OT in the room) Nurse Communication: Mobility status PT Visit Diagnosis: Muscle weakness (generalized) (M62.81);History of falling (Z91.81);Repeated falls (R29.6);Difficulty in walking, not elsewhere classified (R26.2)     Time: 0737-1062 PT Time Calculation (min) (ACUTE ONLY): 41 min  Charges:  $Therapeutic Activity: 8-22 mins                     Roney Marion, PT  Acute Rehabilitation Services Pager [phone] Office [place] 09/25/2019, 3:49 PM

## 2019-09-25 NOTE — Progress Notes (Signed)
PROGRESS NOTE    Nicholas Ramirez  IOE:[id number] DOB: 1945/06/23 DOA: 09/18/2019 PCP: Townsend Roger, MD  Brief Narrative:  Patient is a 74 year old male with HTN, heart murmur, cataracts, hepatitis C presented to [facility] a fall at home.  Patient had multiple falls over the last few months at home. He has been evaluated at outpatient neurology (Dr Tomi Likens) and has had CT scan and MRI of his brain performed. He reported he has had generalized weakness with a slow unsteady gait. He was recently started on levodopa, carbidopa for possible Parkinson's disease by neurology.He fell on the dresser and has sustained a laceration to the side of his eye and hit his eye. He was unable to see and EMS was called. He reports that he had normal vision with his glasses in both eyes until this fall. He reports he cannot see out of the left eye after his fall. He was found to have hyphema and possible hematoma in left eye on CT scan.He denies any loss of consciousness with the fall. CT scan of his orbits shows probable rupture of anterior left globe. Ophthalmology has been consulted and  He underwent surgical repair 9/16. ophthalmology recommended broad spectrum antibiotics. He has developed AKI , Nephrology consulted. Vancomycin discontinued, continued on supportive care, No need for hemodialysis at this time.   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 -Patient was evaluated by ophthalmology, status post surgical repair on 9/16 am. -Ophthalmology following, appreciate Dr. Roda Shutters recommendations, visual prognosis for the eye is extremely guarded, eye shield at all times -Per recommendations,  continue broad-spectrum IV antibiotics and transitioning to p.o. broad-spectrum on discharge, eyedrops, follow-up with ophthalmology as outpatient. -Antibiotic changed to levofloxacin and doxycycline p.o.   Active Problems: Accelerated hypertension - BP on admission elevated however been normal. , will hold off on lisinopril, and amlodipine.   - Hold propranolol.  Acute kidney injury, acute urinary retention: -Patient did have an episode of acute urinary retention , has history of BPH -Creatinine 2.71-> 4.1 , held vancomycin, lisinopril, avoid hypotension. -Placed on IV fluid hydration, renal ultrasound ruled out any obstruction or hydronephrosis -Resumed finasteride, mirabegron ER  -Recheck BMP showed worsening renal functions, Nephrology consulted. -Patient needed foley catheter , since he continues to have urinary retention. -No indication for Hemodialysis at this time, continue supportive care. - Given stress dose of steroids. - serum creatinine peaked 5.05 trending down 4.92 >> 4.28  Mechanical fall, recurrent falls at home, recent diagnosis of Parkinson's -PT/OT evaluation recommended CIR, consult placed -Reviewed recent neurology note by Dr. 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 - started Sinemet, discussed with the patient and daughter.   Hyponatremia >>> Resolved -Serum osmolality 275, urine osmolality 41, urine sodium 108, SIADH component  -IV fluids were held 9/17. Nephrology consulted, recommended gentle hydration x 12 hrs. -Sodium improved to 136.   Prolonged QT interval Avoid QT prolonging medications -Monitor K, magnesium   Obesity Estimated body mass index is 38.19 kg/m as calculated from the following:  Height as of this encounter: 5\' 9"  (1.753  m).   Weight as of this encounter: 117.3 kg.   DVT prophylaxis:SCDs. Code Status: Full Family Communication: Spoke with daughter at bed side. Disposition Plan:  Status is: Inpatient  Remains inpatient appropriate because:Inpatient level of care appropriate due to severity of illness   Dispo: The patient is from: Home              Anticipated d/c is to: CIR              Anticipated d/c date is: 3 days              Patient currently is not medically stable to d/c.   Consultants:   Ophthalmology, nephrology  Procedures: Antimicrobials:  Anti-infectives (From admission, onward)   Start     Dose/Rate Route Frequency Ordered Stop   09/25/19 1000  doxycycline (VIBRA-TABS) tablet 100 mg        100 mg Oral Every 12 hours 09/24/19 1039 10/02/19 0959   09/25/19 1000  levofloxacin (LEVAQUIN) tablet 500 mg        500 mg Oral Every 48 hours 09/24/19 1039     09/23/19 2000  DAPTOmycin (CUBICIN) 800 mg in sodium chloride 0.9 % IVPB  Status:  Discontinued        800 mg 232 mL/hr over 30 Minutes Intravenous Every 48 hours 09/23/19 1728 09/24/19 1039   09/21/19 2200  cefTAZidime (FORTAZ) 1 g in sodium chloride 0.9 % 100 mL IVPB       Note to Pharmacy: Ruptured left eye globe after fall at home and hit head on furniture   1 g 200 mL/hr over 30 Minutes Intravenous Every 24 hours 09/21/19 0731 09/24/19 2204   09/20/19 2200  cefTAZidime (FORTAZ) 1 g in sodium chloride 0.9 % 100 mL IVPB  Status:  Discontinued       Note to Pharmacy: Ruptured left eye globe after fall at home and hit head on furniture   1 g 200 mL/hr over 30 Minutes Intravenous Every 12 hours 09/20/19 1255 09/21/19 0731   09/20/19 1231  vancomycin variable dose per unstable renal function (pharmacist dosing)  Status:  Discontinued         Does not apply See admin instructions 09/20/19 1231 09/20/19 1242   09/19/19 0800  vancomycin (VANCOCIN) IVPB 1000 mg/200 mL premix  Status:  Discontinued        1,000 mg 200 mL/hr over 60  Minutes Intravenous Every 12 hours 09/18/19 1932 09/20/19 1231   09/19/19 0300  cefTAZidime (FORTAZ) 1 g in sodium chloride 0.9 % 100 mL IVPB  Status:  Discontinued       Note to Pharmacy: Ruptured left eye globe after fall at home and hit head on furniture   1 g 200 mL/hr over 30 Minutes Intravenous Every 8 hours 09/19/19 0138 09/20/19 1255   09/18/19 2304  polymyxin 500,000 units/ceftazidime 1 g ophthalmic irrigation  Status:  Discontinued          As needed 09/18/19 2304 09/19/19 0044   09/18/19 2000  vancomycin (VANCOCIN) 2,500 mg in sodium chloride 0.9 % 500 mL IVPB        2,500 mg 250 mL/hr over 120 Minutes Intravenous  Once 09/18/19 1903 09/18/19 2243   09/18/19 1845  cefTAZidime (FORTAZ) 2 g in sodium chloride 0.9 % 100 mL IVPB        2 g 200 mL/hr over 30 Minutes Intravenous  Once 09/18/19 1809 09/18/19 1916  Subjective: Patient was seen and examined at bedside.  Overnight events noted , Left eye covered with shield. Patient reports feeling better state he has slept well throughout the night.  Still reports left eye pain but controlled.  Daughter was at bedside and aware that his kidney functions are improving.  Denies any nausea vomiting and diarrhea.  Objective: Vitals:   09/24/19 2214 09/25/19 0500 09/25/19 0624 09/25/19 0830  BP: 137/79  126/75 127/78  Pulse: 89  79 92  Resp: 17  18 18   Temp: 97.9 F (36.6 C)  98 F (36.7 C) 98.4 F (36.9 C)  TempSrc:    Oral  SpO2: 95%  95% 96%  Weight: 101.6 kg 101.6 kg    Height:        Intake/Output Summary (Last 24 hours) at 09/25/2019 1540 Last data filed at 09/25/2019 1430 Gross per 24 hour  Intake 536.52 ml  Output 3200 ml  Net -2663.48 ml   Filed Weights   09/23/19 0454 09/24/19 2214 09/25/19 0500  Weight: 109.3 kg 101.6 kg 101.6 kg    Examination:  General exam: Appears calm and comfortable , Left eye covered with shield. Respiratory system: Clear to auscultation. Respiratory effort normal.Good air entry    Cardiovascular system: S1 & S2 heard, RRR. No JVD , Murmer +,  No rubs, gallops or clicks.  Gastrointestinal system: Abdomen is distended, soft and nontender. No organomegaly or masses felt.  Normal bowel sounds heard. Central nervous system: Alert and oriented. No focal neurological deficits. Extremities:  No cyanosis, no clubbing,  Pitting edema+ noted Skin: No rashes, lesions or ulcers Psychiatry: Judgement and insight appear normal. Mood & affect appropriate.     Data Reviewed: I have personally reviewed following labs and imaging studies  CBC: Recent Labs  Lab 09/18/19 1811 09/18/19 1811 09/19/19 0341 09/22/19 0736 09/23/19 0455 09/24/19 0247 09/25/19 0618  WBC 6.8  --  6.0 7.1 7.1 8.3  --   NEUTROABS 3.7  --   --   --   --   --   --   HGB 12.6*   < > 12.7* 11.7* 12.3* 12.2* 11.9*  HCT 34.8*   < > 34.6* 33.4* 33.8* 34.2* 32.9*  MCV 96.7  --  94.3 96.5 96.6 95.5  --   PLT 103*  --  115* 117* 132* 113*  --    < > = values in this interval not displayed.   Basic Metabolic Panel: Recent Labs  Lab 09/21/19 0214 09/22/19 0736 09/23/19 0455 09/24/19 0247 09/25/19 0618  NA 126* 128* 131* 133* 136  K 4.2 3.9 4.7 4.7 4.5  CL 96* 100 100 100 106  CO2 20* 19* 21* 20* 20*  GLUCOSE 95 102* 135* 132* 128*  BUN 38* 53* 63* 75* 82*  CREATININE 4.01* 4.40* 5.05* 4.92* 4.28*  CALCIUM 8.0* 8.0* 8.2* 8.3* 8.3*  MG  --  2.1 2.3 2.4 2.3  PHOS  --  6.2* 7.1* 6.3* 5.6*   GFR: Estimated Creatinine Clearance: 18.1 mL/min (A) (by C-G formula based on SCr of 4.28 mg/dL (H)). Liver Function Tests: Recent Labs  Lab 09/23/19 0455 09/24/19 0247 09/25/19 0618  AST 136* 106* 99*  ALT 13 17 25   ALKPHOS 201* 183* 155*  BILITOT 6.4* 5.7* 5.1*  PROT 7.1 6.8 6.5  ALBUMIN 1.7* 1.7* 1.5*   No results for input(s): LIPASE, AMYLASE in the last 168 hours. No results for input(s): AMMONIA in the last 168 hours. Coagulation Profile: Recent Labs  Lab  09/18/19 1811  INR 1.3*   Cardiac  Enzymes: Recent Labs  Lab 09/23/19 0455  CKTOTAL 149   BNP (last 3 results) No results for input(s): PROBNP in the last 8760 hours. HbA1C: No results for input(s): HGBA1C in the last 72 hours. CBG: No results for input(s): GLUCAP in the last 168 hours. Lipid Profile: No results for input(s): CHOL, HDL, LDLCALC, TRIG, CHOLHDL, LDLDIRECT in the last 72 hours. Thyroid Function Tests: No results for input(s): TSH, T4TOTAL, FREET4, T3FREE, THYROIDAB in the last 72 hours. Anemia Panel: No results for input(s): VITAMINB12, FOLATE, FERRITIN, TIBC, IRON, RETICCTPCT in the last 72 hours. Sepsis Labs: No results for input(s): PROCALCITON, LATICACIDVEN in the last 168 hours.  Recent Results (from the past 240 hour(s))  SARS Coronavirus 2 by RT PCR (hospital order, performed in [facility] hospital lab) Nasopharyngeal Nasopharyngeal Swab     Status: None   Collection Time: 09/18/19  6:00 PM   Specimen: Nasopharyngeal Swab  Result Value Ref Range Status   SARS Coronavirus 2 NEGATIVE NEGATIVE Final    Comment: (NOTE) SARS-CoV-2 target nucleic acids are NOT DETECTED.  The SARS-CoV-2 RNA is generally detectable in upper and lower respiratory specimens during the acute phase of infection. The lowest concentration of SARS-CoV-2 viral copies this assay can detect is 250 copies / mL. A negative result does not preclude SARS-CoV-2 infection and should not be used as the sole basis for treatment or other patient management decisions.  A negative result may occur with improper specimen collection / handling, submission of specimen other than nasopharyngeal swab, presence of viral mutation(s) within the areas targeted by this assay, and inadequate number of viral copies (<250 copies / mL). A negative result must be combined with clinical observations, patient history, and epidemiological information.  Fact Sheet for Patients:   StrictlyIdeas.no  Fact Sheet for Healthcare  Providers: BankingDealers.co.za  This test is not yet approved or  cleared by the [place] FDA and has been authorized for detection and/or diagnosis of SARS-CoV-2 by FDA under an Emergency Use Authorization (EUA).  This EUA will remain in effect (meaning this test can be used) for the duration of the COVID-19 declaration under Section 564(b)(1) of the Act, 21 U.S.C. section 360bbb-3(b)(1), unless the authorization is terminated or revoked sooner.  Performed at [facility], [place] [address], [place], [place] [address]      Radiology Studies: No results found.  Scheduled Meds: . carbidopa-levodopa  0.5 tablet Oral BID  . Chlorhexidine Gluconate Cloth  6 each Topical Daily  . doxycycline  100 mg Oral Q12H  . DULoxetine  60 mg Oral Daily  . finasteride  5 mg Oral Daily  . gatifloxacin  1 drop Left Eye QID  . hydrocortisone sod succinate (SOLU-CORTEF) inj  100 mg Intravenous Q8H  . levofloxacin  500 mg Oral Q48H  . loratadine  10 mg Oral Daily  . mirabegron ER  50 mg Oral Daily  . neomycin-polymyxin b-dexamethasone  1 application Left Eye QID  . prednisoLONE acetate  1 drop Left Eye QID  . senna-docusate  1 tablet Oral BID   Continuous Infusions:    LOS: 7 days    Time spent:  25 mins.    Shawna Clamp, MD [facility]   If 7PM-7AM, please contact night-coverage

## 2019-09-25 NOTE — Progress Notes (Addendum)
Inpatient Rehab Admissions Coordinator:   I have received request for updated clinicals from Pt.'s insurer. Pt. Has not been seen by OT since 9/17, so I have requested that OT see him today and will send clinicals once OT note has been entered. I notified the Pt. That I do not yet have insurance authorization for CIR.   Clemens Catholic, [place], [place] Admissions Coordinator  [phone] ([place]) [phone] (office)

## 2019-09-26 LAB — COMPREHENSIVE METABOLIC PANEL
ALT: 45 U/L — ABNORMAL HIGH (ref 0–44)
AST: 127 U/L — ABNORMAL HIGH (ref 15–41)
Albumin: 1.8 g/dL — ABNORMAL LOW (ref 3.5–5.0)
Alkaline Phosphatase: 166 U/L — ABNORMAL HIGH (ref 38–126)
Anion gap: 11 (ref 5–15)
BUN: 89 mg/dL — ABNORMAL HIGH (ref 8–23)
CO2: 19 mmol/L — ABNORMAL LOW (ref 22–32)
Calcium: 8.3 mg/dL — ABNORMAL LOW (ref 8.9–10.3)
Chloride: 107 mmol/L (ref 98–111)
Creatinine, Ser: 4.04 mg/dL — ABNORMAL HIGH (ref 0.61–1.24)
GFR calc Af Amer: 16 mL/min — ABNORMAL LOW (ref >60)
GFR calc non Af Amer: 14 mL/min — ABNORMAL LOW (ref >60)
Glucose, Bld: 170 mg/dL — ABNORMAL HIGH (ref 70–99)
Potassium: 5 mmol/L (ref 3.5–5.1)
Sodium: 137 mmol/L (ref 135–145)
Total Bilirubin: 5.6 mg/dL — ABNORMAL HIGH (ref 0.3–1.2)
Total Protein: 7.5 g/dL (ref 6.5–8.1)

## 2019-09-26 LAB — HEMOGLOBIN AND HEMATOCRIT, BLOOD
HCT: 37.2 % — ABNORMAL LOW (ref 39.0–52.0)
Hemoglobin: 12.9 g/dL — ABNORMAL LOW (ref 13.0–17.0)

## 2019-09-26 LAB — MAGNESIUM: Magnesium: 2.2 mg/dL (ref 1.7–2.4)

## 2019-09-26 LAB — PHOSPHORUS: Phosphorus: 6 mg/dL — ABNORMAL HIGH (ref 2.5–4.6)

## 2019-09-26 NOTE — Progress Notes (Addendum)
PROGRESS NOTE    Nicholas Ramirez  WGY:[id number] DOB: [birth year] DOA: 09/18/2019 PCP: Townsend Roger, MD  Brief Narrative:  Patient is a 74 year old male with HTN, heart murmur, cataracts, hepatitis C presented to [place] a fall at home.  Patient had multiple falls over the last few months at home. He has been evaluated at outpatient neurology (Dr Tomi Likens) and has had CT scan and MRI of his brain performed. He reported he has had generalized weakness with a slow unsteady gait. He was recently started on levodopa, carbidopa for possible Parkinson's disease by neurology.He fell on the dresser and has sustained a laceration to the side of his eye and hit his eye. He was unable to see and EMS was called. He reports that he had normal vision with his glasses in both eyes until this fall. He reports he cannot see out of the left eye after his fall. He was found to have hyphema and possible hematoma in left eye on CT scan.He denies any loss of consciousness with the fall. CT scan of his orbits shows probable rupture of anterior left globe. Ophthalmology has been consulted and  He underwent surgical repair 9/16. ophthalmology recommended broad spectrum antibiotics. He has developed AKI , Nephrology consulted. Vancomycin discontinued, He has developed urinary retention, requiring foley catheter. Renal functions are slowly improving. Continued on supportive care, No need for hemodialysis at this time.    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 -Patient was evaluated by ophthalmology, status post surgical repair on 9/16 am. -Ophthalmology following, appreciate Dr. Roda Shutters recommendations,  visual prognosis for the eye is extremely guarded, eye shield at all times -Per recommendations, continue broad-spectrum IV antibiotics and transitioning to p.o. broad-spectrum on discharge, eyedrops, follow-up with ophthalmology as outpatient. -Antibiotic changed to levofloxacin and doxycycline p.o.   Active Problems: Accelerated hypertension - BP on admission elevated however been normal. , will hold off on lisinopril, and amlodipine.   - Hold propranolol.  Acute kidney injury :  Multifactorial : -Patient did have an episode of acute urinary retention , has history of BPH -Creatinine 2.71-> 4.1 , held vancomycin, lisinopril, avoid hypotension. -Placed on IV fluid hydration, renal ultrasound ruled out any obstruction or hydronephrosis -Resumed finasteride, mirabegron ER  -Recheck BMP showed worsening renal functions, Nephrology consulted. -Patient needed foley catheter , since he continues to have urinary retention. -No indication for Hemodialysis at this time, continue supportive care. - Given stress dose of steroids. - serum creatinine peaked 5.05 trending down 4.92 >> 4.28 >> 4.08  Mechanical fall, recurrent falls at home, recent diagnosis of Parkinson's -PT/OT evaluation recommended CIR, consult placed -Reviewed recent neurology note by Dr. 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 - started Sinemet, discussed with the patient and daughter.   Hyponatremia >>> Resolved -Serum osmolality 275, urine osmolality 41, urine sodium 108, SIADH component  -IV fluids were held 9/17. Nephrology consulted, recommended gentle hydration x 12 hrs. -Sodium improved to 136.   Prolonged QT interval Avoid QT prolonging medications -Monitor K, magnesium  Constipation : Senokot/ colace   bid  Tap water enema x1   Obesity Estimated body mass index is 38.19 kg/m as calculated from the following:   Height as of this encounter: 5\' 9"  (1.753 m).   Weight as of this encounter: 117.3 kg.   DVT prophylaxis:SCDs. Code Status: Full Family Communication: Spoke with daughter at bed side. Disposition Plan:  Status is: Inpatient  Remains inpatient appropriate because:Inpatient level of care appropriate due to severity of illness   Dispo: The patient is from: Home              Anticipated d/c is to: CIR              Anticipated d/c date is: 3 days              Patient currently is not medically stable to d/c.  Patient's insurance has required peer to peer review for disposition to the rehab.  Peer to peer completed.  Patient got approved for subacute rehab but denied inpatient rehab,  Patient and family decided to appeal for that decision.   Consultants:   Ophthalmology, nephrology  Procedures: Antimicrobials:  Anti-infectives (From admission, onward)   Start     Dose/Rate Route Frequency Ordered Stop   09/25/19 1000  doxycycline (VIBRA-TABS) tablet 100 mg        100 mg Oral Every 12 hours 09/24/19 1039 10/02/19 0959   09/25/19 1000  levofloxacin (LEVAQUIN) tablet 500 mg        500 mg Oral Every 48 hours 09/24/19 1039     09/23/19 2000  DAPTOmycin (CUBICIN) 800 mg in sodium chloride 0.9 % IVPB  Status:  Discontinued        800 mg 232 mL/hr over 30 Minutes Intravenous Every 48 hours 09/23/19 1728 09/24/19 1039   09/21/19 2200  cefTAZidime (FORTAZ) 1 g in sodium chloride 0.9 % 100 mL IVPB       Note to Pharmacy: Ruptured left eye globe after fall at home and hit head on furniture   1 g 200 mL/hr over 30 Minutes Intravenous Every 24 hours 09/21/19 0731 09/24/19 2204   09/20/19 2200  cefTAZidime (FORTAZ) 1 g in sodium chloride 0.9 % 100 mL IVPB  Status:  Discontinued       Note to Pharmacy: Ruptured left eye globe after fall at home and hit head on furniture   1 g 200  mL/hr over 30 Minutes Intravenous Every 12 hours 09/20/19 1255 09/21/19 0731   09/20/19 1231  vancomycin variable dose per unstable renal function (pharmacist dosing)  Status:  Discontinued         Does not apply See admin instructions 09/20/19 1231 09/20/19 1242   09/19/19 0800  vancomycin (VANCOCIN) IVPB 1000 mg/200 mL premix  Status:  Discontinued        1,000 mg 200 mL/hr over 60 Minutes Intravenous Every 12 hours 09/18/19 1932 09/20/19 1231   09/19/19 0300  cefTAZidime (FORTAZ) 1 g in sodium chloride 0.9 % 100 mL IVPB  Status:  Discontinued       Note to Pharmacy: Ruptured left eye globe after fall at home and hit head on furniture   1 g 200 mL/hr over 30 Minutes Intravenous Every 8 hours 09/19/19 0138 09/20/19 1255   09/18/19 2304  polymyxin 500,000 units/ceftazidime 1 g ophthalmic irrigation  Status:  Discontinued          As needed 09/18/19 2304 09/19/19 0044   09/18/19 2000  vancomycin (VANCOCIN) 2,500  mg in sodium chloride 0.9 % 500 mL IVPB        2,500 mg 250 mL/hr over 120 Minutes Intravenous  Once 09/18/19 1903 09/18/19 2243   09/18/19 1845  cefTAZidime (FORTAZ) 2 g in sodium chloride 0.9 % 100 mL IVPB        2 g 200 mL/hr over 30 Minutes Intravenous  Once 09/18/19 1809 09/18/19 1916      Subjective: Patient was seen and examined at bedside.  Overnight events noted ,  Patient reports feeling better , sitting in the chair besides bed, reports pain is better controlled,  He hasn't had bowel movement yet. Abdomen appears distended. Left eye covered with shield.  Objective: Vitals:   09/25/19 1645 09/25/19 2133 09/26/19 0413 09/26/19 0858  BP: (!) 147/79 (!) 151/93 (!) 176/116 134/83  Pulse: 89 (!) 102 96 95  Resp: 18 17 17 18   Temp: 98.5 F (36.9 C) 98.6 F (37 C) (!) 97.4 F (36.3 C) 98.1 F (36.7 C)  TempSrc: Oral Oral Oral Oral  SpO2: 95% 93% (!) 89% 95%  Weight:  101.6 kg    Height:        Intake/Output Summary (Last 24 hours) at 09/26/2019 1558 Last data  filed at 09/26/2019 1300 Gross per 24 hour  Intake 700 ml  Output 1950 ml  Net -1250 ml   Filed Weights   09/24/19 2214 09/25/19 0500 09/25/19 2133  Weight: 101.6 kg 101.6 kg 101.6 kg    Examination:  General exam: Appears calm and comfortable , Left eye covered with shield. Respiratory system: Clear to auscultation. Respiratory effort normal.Good air entry  Cardiovascular system: S1 & S2 heard, RRR. No JVD , Murmer +,  No rubs, gallops or clicks.  Gastrointestinal system: Abdomen is distended, soft and nontender. No organomegaly or masses felt.  Normal bowel sounds heard. Central nervous system: Alert and oriented. No focal neurological deficits. Extremities:  No cyanosis, no clubbing,  Pitting edema+ noted Skin: No rashes, lesions or ulcers Psychiatry: Judgement and insight appear normal. Mood & affect appropriate.     Data Reviewed: I have personally reviewed following labs and imaging studies  CBC: Recent Labs  Lab 09/22/19 0736 09/23/19 0455 09/24/19 0247 09/25/19 0618 09/26/19 1045  WBC 7.1 7.1 8.3  --   --   HGB 11.7* 12.3* 12.2* 11.9* 12.9*  HCT 33.4* 33.8* 34.2* 32.9* 37.2*  MCV 96.5 96.6 95.5  --   --   PLT 117* 132* 113*  --   --    Basic Metabolic Panel: Recent Labs  Lab 09/22/19 0736 09/23/19 0455 09/24/19 0247 09/25/19 0618 09/26/19 1045  NA 128* 131* 133* 136 137  K 3.9 4.7 4.7 4.5 5.0  CL 100 100 100 106 107  CO2 19* 21* 20* 20* 19*  GLUCOSE 102* 135* 132* 128* 170*  BUN 53* 63* 75* 82* 89*  CREATININE 4.40* 5.05* 4.92* 4.28* 4.04*  CALCIUM 8.0* 8.2* 8.3* 8.3* 8.3*  MG 2.1 2.3 2.4 2.3 2.2  PHOS 6.2* 7.1* 6.3* 5.6* 6.0*   GFR: Estimated Creatinine Clearance: 19.1 mL/min (A) (by C-G formula based on SCr of 4.04 mg/dL (H)). Liver Function Tests: Recent Labs  Lab 09/23/19 0455 09/24/19 0247 09/25/19 0618 09/26/19 1045  AST 136* 106* 99* 127*  ALT 13 17 25  45*  ALKPHOS 201* 183* 155* 166*  BILITOT 6.4* 5.7* 5.1* 5.6*  PROT 7.1 6.8 6.5  7.5  ALBUMIN 1.7* 1.7* 1.5* 1.8*   No results for input(s): LIPASE, AMYLASE in  the last 168 hours. No results for input(s): AMMONIA in the last 168 hours. Coagulation Profile: No results for input(s): INR, PROTIME in the last 168 hours. Cardiac Enzymes: Recent Labs  Lab 09/23/19 0455  CKTOTAL 149   BNP (last 3 results) No results for input(s): PROBNP in the last 8760 hours. HbA1C: No results for input(s): HGBA1C in the last 72 hours. CBG: No results for input(s): GLUCAP in the last 168 hours. Lipid Profile: No results for input(s): CHOL, HDL, LDLCALC, TRIG, CHOLHDL, LDLDIRECT in the last 72 hours. Thyroid Function Tests: No results for input(s): TSH, T4TOTAL, FREET4, T3FREE, THYROIDAB in the last 72 hours. Anemia Panel: No results for input(s): VITAMINB12, FOLATE, FERRITIN, TIBC, IRON, RETICCTPCT in the last 72 hours. Sepsis Labs: No results for input(s): PROCALCITON, LATICACIDVEN in the last 168 hours.  Recent Results (from the past 240 hour(s))  SARS Coronavirus 2 by RT PCR (hospital order, performed in [facility] hospital lab) Nasopharyngeal Nasopharyngeal Swab     Status: None   Collection Time: 09/18/19  6:00 PM   Specimen: Nasopharyngeal Swab  Result Value Ref Range Status   SARS Coronavirus 2 NEGATIVE NEGATIVE Final    Comment: (NOTE) SARS-CoV-2 target nucleic acids are NOT DETECTED.  The SARS-CoV-2 RNA is generally detectable in upper and lower respiratory specimens during the acute phase of infection. The lowest concentration of SARS-CoV-2 viral copies this assay can detect is 250 copies / mL. A negative result does not preclude SARS-CoV-2 infection and should not be used as the sole basis for treatment or other patient management decisions.  A negative result may occur with improper specimen collection / handling, submission of specimen other than nasopharyngeal swab, presence of viral mutation(s) within the areas targeted by this assay, and inadequate number  of viral copies (<250 copies / mL). A negative result must be combined with clinical observations, patient history, and epidemiological information.  Fact Sheet for Patients:   StrictlyIdeas.no  Fact Sheet for Healthcare Providers: BankingDealers.co.za  This test is not yet approved or  cleared by the [place] FDA and has been authorized for detection and/or diagnosis of SARS-CoV-2 by FDA under an Emergency Use Authorization (EUA).  This EUA will remain in effect (meaning this test can be used) for the duration of the COVID-19 declaration under Section 564(b)(1) of the Act, 21 U.S.C. section 360bbb-3(b)(1), unless the authorization is terminated or revoked sooner.  Performed at [facility], [place] [address] [place], [place] [address]      Radiology Studies: No results found.  Scheduled Meds: . carbidopa-levodopa  0.5 tablet Oral BID  . Chlorhexidine Gluconate Cloth  6 each Topical Daily  . doxycycline  100 mg Oral Q12H  . DULoxetine  60 mg Oral Daily  . finasteride  5 mg Oral Daily  . gatifloxacin  1 drop Left Eye QID  . hydrocortisone sod succinate (SOLU-CORTEF) inj  100 mg Intravenous Q8H  . levofloxacin  500 mg Oral Q48H  . loratadine  10 mg Oral Daily  . mirabegron ER  50 mg Oral Daily  . neomycin-polymyxin b-dexamethasone  1 application Left Eye QID  . prednisoLONE acetate  1 drop Left Eye QID  . senna-docusate  1 tablet Oral BID   Continuous Infusions:    LOS: 8 days    Time spent:  25 mins.    Shawna Clamp, MD [facility]   If 7PM-7AM, please contact night-coverage

## 2019-09-26 NOTE — Plan of Care (Signed)
OOB to chair

## 2019-09-26 NOTE — Progress Notes (Signed)
Patient ID: Caius Silbernagel, male   DOB: Dec 24, 1945, 74 y.o.   MRN: [id number] S: Feels well and sitting in a chair O:BP 134/83 (BP Location: Left Arm)   Pulse 95   Temp 98.1 F (36.7 C) (Oral)   Resp 18   Ht 5\' 9"  (1.753 m)   Wt 101.6 kg   SpO2 95%   BMI 33.08 kg/m   Intake/Output Summary (Last 24 hours) at 09/26/2019 1238 Last data filed at 09/26/2019 0601 Gross per 24 hour  Intake 680 ml  Output 1750 ml  Net -1070 ml   Intake/Output: I/O last 3 completed shifts: In: 1016.5 [P.O.:930; IV Piggyback:86.5] Out: 7564 [Urine:3450]  Intake/Output this shift:  No intake/output data recorded. Weight change: -0.006 kg Gen: NAD CVS: no rub Resp: cta Abd: benign Ext: trace edema BLE  Recent Labs  Lab 09/20/19 1855 09/21/19 0214 09/22/19 0736 09/23/19 0455 09/24/19 0247 09/25/19 0618 09/26/19 1045  NA 127* 126* 128* 131* 133* 136 137  K 4.2 4.2 3.9 4.7 4.7 4.5 5.0  CL 96* 96* 100 100 100 106 107  CO2 20* 20* 19* 21* 20* 20* 19*  GLUCOSE 137* 95 102* 135* 132* 128* 170*  BUN 34* 38* 53* 63* 75* 82* 89*  CREATININE 3.54* 4.01* 4.40* 5.05* 4.92* 4.28* 4.04*  ALBUMIN  --   --   --  1.7* 1.7* 1.5* 1.8*  CALCIUM 8.0* 8.0* 8.0* 8.2* 8.3* 8.3* 8.3*  PHOS  --   --  6.2* 7.1* 6.3* 5.6* 6.0*  AST  --   --   --  136* 106* 99* 127*  ALT  --   --   --  13 17 25  45*   Liver Function Tests: Recent Labs  Lab 09/24/19 0247 09/25/19 0618 09/26/19 1045  AST 106* 99* 127*  ALT 17 25 45*  ALKPHOS 183* 155* 166*  BILITOT 5.7* 5.1* 5.6*  PROT 6.8 6.5 7.5  ALBUMIN 1.7* 1.5* 1.8*   No results for input(s): LIPASE, AMYLASE in the last 168 hours. No results for input(s): AMMONIA in the last 168 hours. CBC: Recent Labs  Lab 09/22/19 0736 09/22/19 0736 09/23/19 0455 09/23/19 0455 09/24/19 0247 09/25/19 0618 09/26/19 1045  WBC 7.1  --  7.1  --  8.3  --   --   HGB 11.7*   < > 12.3*   < > 12.2* 11.9* 12.9*  HCT 33.4*   < > 33.8*   < > 34.2* 32.9* 37.2*  MCV 96.5  --  96.6  --   95.5  --   --   PLT 117*  --  132*  --  113*  --   --    < > = values in this interval not displayed.   Cardiac Enzymes: Recent Labs  Lab 09/23/19 0455  CKTOTAL 149   CBG: No results for input(s): GLUCAP in the last 168 hours.  Iron Studies: No results for input(s): IRON, TIBC, TRANSFERRIN, FERRITIN in the last 72 hours. Studies/Results: No results found. . carbidopa-levodopa  0.5 tablet Oral BID  . Chlorhexidine Gluconate Cloth  6 each Topical Daily  . doxycycline  100 mg Oral Q12H  . DULoxetine  60 mg Oral Daily  . finasteride  5 mg Oral Daily  . gatifloxacin  1 drop Left Eye QID  . hydrocortisone sod succinate (SOLU-CORTEF) inj  100 mg Intravenous Q8H  . levofloxacin  500 mg Oral Q48H  . loratadine  10 mg Oral Daily  . mirabegron ER  50 mg  Oral Daily  . neomycin-polymyxin b-dexamethasone  1 application Left Eye QID  . prednisoLONE acetate  1 drop Left Eye QID  . senna-docusate  1 tablet Oral BID    BMET    Component Value Date/Time   NA 137 09/26/2019 1045   K 5.0 09/26/2019 1045   CL 107 09/26/2019 1045   CO2 19 (L) 09/26/2019 1045   GLUCOSE 170 (H) 09/26/2019 1045   BUN 89 (H) 09/26/2019 1045   CREATININE 4.04 (H) 09/26/2019 1045   CALCIUM 8.3 (L) 09/26/2019 1045   GFRNONAA 14 (L) 09/26/2019 1045   GFRAA 16 (L) 09/26/2019 1045   CBC    Component Value Date/Time   WBC 8.3 09/24/2019 0247   RBC 3.58 (L) 09/24/2019 0247   HGB 12.9 (L) 09/26/2019 1045   HCT 37.2 (L) 09/26/2019 1045   PLT 113 (L) 09/24/2019 0247   MCV 95.5 09/24/2019 0247   MCH 34.1 (H) 09/24/2019 0247   MCHC 35.7 09/24/2019 0247   RDW 18.5 (H) 09/24/2019 0247   LYMPHSABS 1.8 09/18/2019 1811   MONOABS 0.9 09/18/2019 1811   EOSABS 0.2 09/18/2019 1811   BASOSABS 0.1 09/18/2019 1811     Assessment/Plan:  1. AKI, non-oliguric, multifactorial, in setting of hemodynamically mediated ischemic ATN (hypotension, hypovolemia, IV vanco(trough of 35 on 09/20/19),and urinary retention in  setting of ACE inhibitor).  1. UOP has significantly increased after foley catheter placement due to BOO. 2. Scr slowly improving. 3. Continue to follow UOP and daily BUN/Cr 4. No uremic symptoms or indication for urgent dialysis. 5. Net negative 2 liters and may be having post-obstructive diuresis.  If continues may need to start IVF's to prevent ATN. 2. 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 ophthamology performed surgical repair on 9/16. 3. HTN- on low side and taken off of lisinopril 9/18. 4. Hyponatremia- resolved 5. Urinary retention-s/p Foley catheter 09/24/19. 6. Abnormal LFT's- increasedt. Bilirubin and elevated AST. Continue to follow LFT's 7. Constipation- per pt no BM since last Wednesday. Consider laxatives. Per primary.  Donetta Potts, MD [employer] [phone]

## 2019-09-26 NOTE — Progress Notes (Signed)
Physical Therapy Treatment Patient Details Name: Nicholas Ramirez MRN: [id number] DOB: October 15, 1945 Today's Date: 09/26/2019    History of Present Illness The pt is a 74 yo male presenting after a fall at home that resulted in injury to his L eye (rupture of anterior left globe). PMH includes: HTN, heart murmur, cataracts, hepatitis C, and ongoing workup for Parkinson's Disease with outpatient neurology.    PT Comments    Patient received in bed, agrees to PT session. He is pleasant and cooperative. Daughter present but on work call during session. He requires mod +2 assist for supine to sit to his right. Able to sit edge of bed with min guard. Slight lean to left at times. Requires assist to place left UE on stedy for preparation in standing. He Is able to stand with B UE support and min A +2. Performed sit to stand x 2-3 reps. Attempted weight shifting in standing but was difficult. He will continue to benefit from skilled PT while here to improve functional independence, strength and safety with mobility.      Follow Up Recommendations  CIR     Equipment Recommendations  Rolling walker with 5" wheels;3in1 (PT)    Recommendations for Other Services Rehab consult     Precautions / Restrictions Precautions Precautions: Fall Precaution Comments: pt admitted for a fall, daughter reports 15-20 in last 6 months Restrictions Weight Bearing Restrictions: No    Mobility  Bed Mobility Overal bed mobility: Needs Assistance Bed Mobility: Supine to Sit     Supine to sit: Mod assist;+2 for physical assistance;+2 for safety/equipment     General bed mobility comments: Patient requires assist to bring legs off side of bed and or trunk support when rising to sitting position.  Transfers Overall transfer level: Needs assistance Equipment used: Ambulation equipment used Transfers: Sit to/from Stand Sit to Stand: Min assist;+2 physical assistance;+2 safety/equipment         General  transfer comment: assist to power into standing in stedy frame. Increased assist/facilitation used on L side due to LUE weakness. Multimodal cues used for appropriate posture  Ambulation/Gait             General Gait Details: Cues to try weight shifting in standing.   Stairs             Wheelchair Mobility    Modified Rankin (Stroke Patients Only)       Balance Overall balance assessment: Needs assistance Sitting-balance support: Single extremity supported;Feet supported Sitting balance-Leahy Scale: Fair Sitting balance - Comments: fluctates due to posterior lean, min-mod assist but preference to at least 1 UE support   Standing balance support: Bilateral upper extremity supported;During functional activity Standing balance-Leahy Scale: Poor Standing balance comment: Reliant on B UE support and equipment for safety                            Cognition Arousal/Alertness: Awake/alert Behavior During Therapy: WFL for tasks assessed/performed Overall Cognitive Status: Impaired/Different from baseline Area of Impairment: Attention;Following commands;Safety/judgement;Awareness;Problem solving                   Current Attention Level: Selective   Following Commands: Follows multi-step commands inconsistently;Follows one step commands with increased time Safety/Judgement: Decreased awareness of safety;Decreased awareness of deficits Awareness: Emergent Problem Solving: Slow processing;Decreased initiation;Difficulty sequencing;Requires tactile cues;Requires verbal cues General Comments: patient is pleasant. Cooperative during session.      Exercises  General Comments        Pertinent Vitals/Pain Pain Assessment: Faces Faces Pain Scale: Hurts a little bit Pain Location: "everywhere" mostly L eye Pain Descriptors / Indicators: Aching;Sore Pain Intervention(s): Monitored during session    Home Living                      Prior  Function            PT Goals (current goals can now be found in the care plan section) Acute Rehab PT Goals Patient Stated Goal: fall less PT Goal Formulation: With patient/family Time For Goal Achievement: 10/03/19 Potential to Achieve Goals: Fair Progress towards PT goals: Progressing toward goals    Frequency    Min 3X/week      PT Plan Current plan remains appropriate    Co-evaluation              AM-PAC PT "6 Clicks" Mobility   Outcome Measure  Help needed turning from your back to your side while in a flat bed without using bedrails?: A Little Help needed moving from lying on your back to sitting on the side of a flat bed without using bedrails?: A Lot Help needed moving to and from a bed to a chair (including a wheelchair)?: A Lot Help needed standing up from a chair using your arms (e.g., wheelchair or bedside chair)?: A Lot Help needed to walk in hospital room?: Total Help needed climbing 3-5 steps with a railing? : Total 6 Click Score: 11    End of Session Equipment Utilized During Treatment: Gait belt Activity Tolerance: Patient tolerated treatment well Patient left: in chair;with call bell/phone within reach;with chair alarm set;with family/visitor present Nurse Communication: Mobility status PT Visit Diagnosis: Muscle weakness (generalized) (M62.81);History of falling (Z91.81);Repeated falls (R29.6);Difficulty in walking, not elsewhere classified (R26.2)     Time: 7989-2119 PT Time Calculation (min) (ACUTE ONLY): 30 min  Charges:  $Therapeutic Activity: 23-37 mins                     Latica Hohmann, PT, GCS 09/26/19,12:48 PM

## 2019-09-26 NOTE — Progress Notes (Signed)
IP rehab admissions - I met with patient and his daughter at the bedside.  I explained the insurance denial for inpatient rehab admission.  I explained the expedited appeal process to them.  The daughter wishes to proceed with the expedited appeal.  I have faxed a cover letter and supporting clinical documentation to [facility] insurance carrier.  This process could take 72 hours to get a decision.  I did explain to daughter that once patient is medically ready that we cannot keep the patient in the hospital awaiting a decision.  I will update all once I hear back from insurance case manager.  Call me for questions.  [phone]

## 2019-09-26 NOTE — Progress Notes (Signed)
Tap water enema given

## 2019-09-27 ENCOUNTER — Inpatient Hospital Stay (HOSPITAL_COMMUNITY): Payer: Medicare Other

## 2019-09-27 LAB — COMPREHENSIVE METABOLIC PANEL
ALT: 41 U/L (ref 0–44)
AST: 109 U/L — ABNORMAL HIGH (ref 15–41)
Albumin: 1.6 g/dL — ABNORMAL LOW (ref 3.5–5.0)
Alkaline Phosphatase: 130 U/L — ABNORMAL HIGH (ref 38–126)
Anion gap: 9 (ref 5–15)
BUN: 88 mg/dL — ABNORMAL HIGH (ref 8–23)
CO2: 22 mmol/L (ref 22–32)
Calcium: 8.1 mg/dL — ABNORMAL LOW (ref 8.9–10.3)
Chloride: 106 mmol/L (ref 98–111)
Creatinine, Ser: 3.51 mg/dL — ABNORMAL HIGH (ref 0.61–1.24)
GFR calc Af Amer: 19 mL/min — ABNORMAL LOW (ref >60)
GFR calc non Af Amer: 16 mL/min — ABNORMAL LOW (ref >60)
Glucose, Bld: 150 mg/dL — ABNORMAL HIGH (ref 70–99)
Potassium: 5.3 mmol/L — ABNORMAL HIGH (ref 3.5–5.1)
Sodium: 137 mmol/L (ref 135–145)
Total Bilirubin: 5.1 mg/dL — ABNORMAL HIGH (ref 0.3–1.2)
Total Protein: 6.3 g/dL — ABNORMAL LOW (ref 6.5–8.1)

## 2019-09-27 LAB — CBC
HCT: 33.8 % — ABNORMAL LOW (ref 39.0–52.0)
Hemoglobin: 12 g/dL — ABNORMAL LOW (ref 13.0–17.0)
MCH: 35.1 pg — ABNORMAL HIGH (ref 26.0–34.0)
MCHC: 35.5 g/dL (ref 30.0–36.0)
MCV: 98.8 fL (ref 80.0–100.0)
Platelets: 94 10*3/uL — ABNORMAL LOW (ref 150–400)
RBC: 3.42 MIL/uL — ABNORMAL LOW (ref 4.22–5.81)
RDW: 18.6 % — ABNORMAL HIGH (ref 11.5–15.5)
WBC: 8.5 10*3/uL (ref 4.0–10.5)
nRBC: 0 % (ref 0.0–0.2)

## 2019-09-27 LAB — PHOSPHORUS: Phosphorus: 5.4 mg/dL — ABNORMAL HIGH (ref 2.5–4.6)

## 2019-09-27 LAB — MAGNESIUM: Magnesium: 2.1 mg/dL (ref 1.7–2.4)

## 2019-09-27 IMAGING — US US ABDOMEN LIMITED
1 series · 13 of 25 positions shown · non-contrast
Comparison: Abdominal ultrasound [DATE]

CLINICAL DATA: Elevated liver enzymes.

EXAM:
ULTRASOUND ABDOMEN LIMITED RIGHT UPPER QUADRANT

[Series 1: us abdomen limited ruq · 13 of 106 slices shown]
[im 1/106]
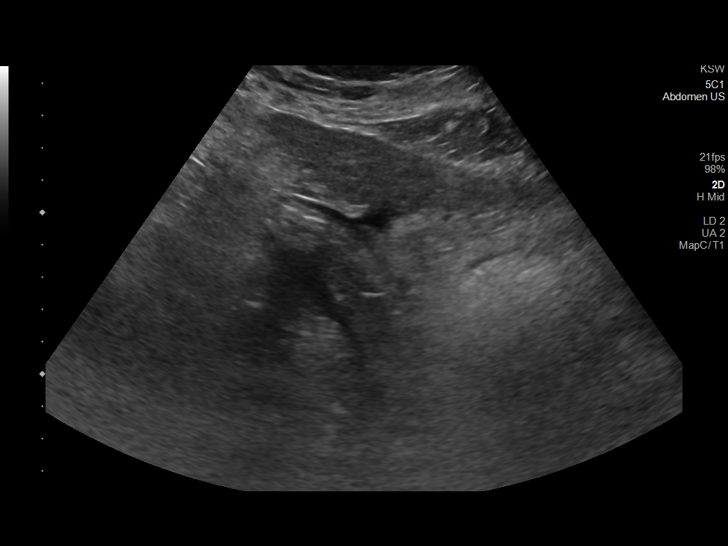
[im 9/106]
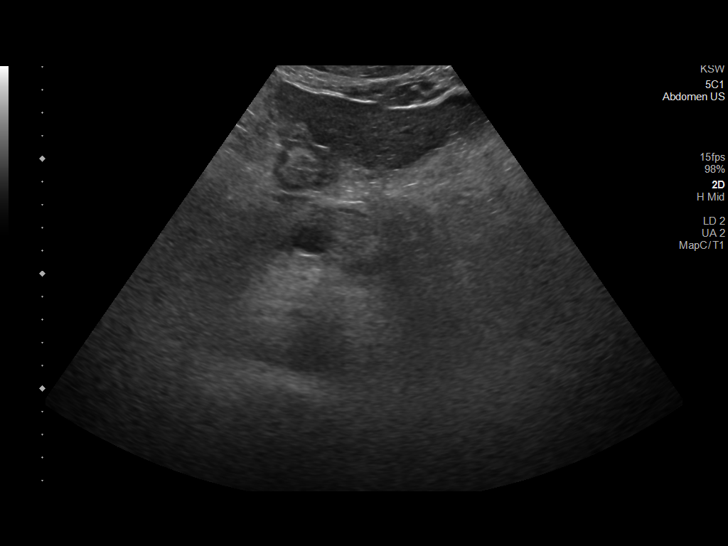
[im 18/106]
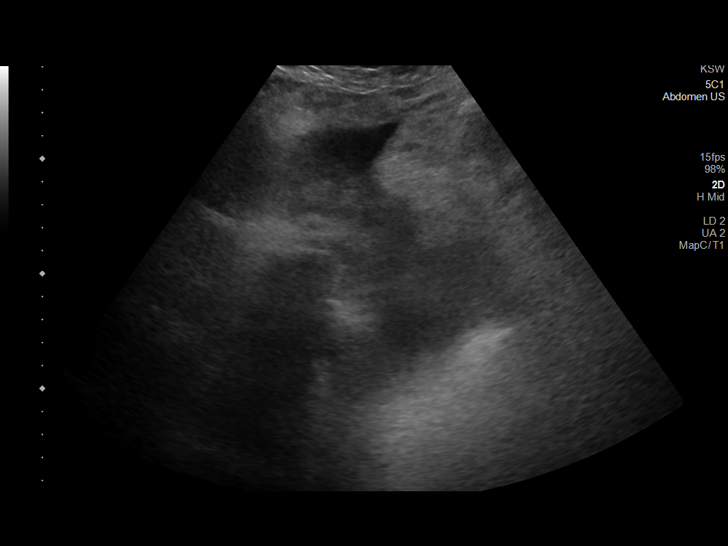
[im 27/106]
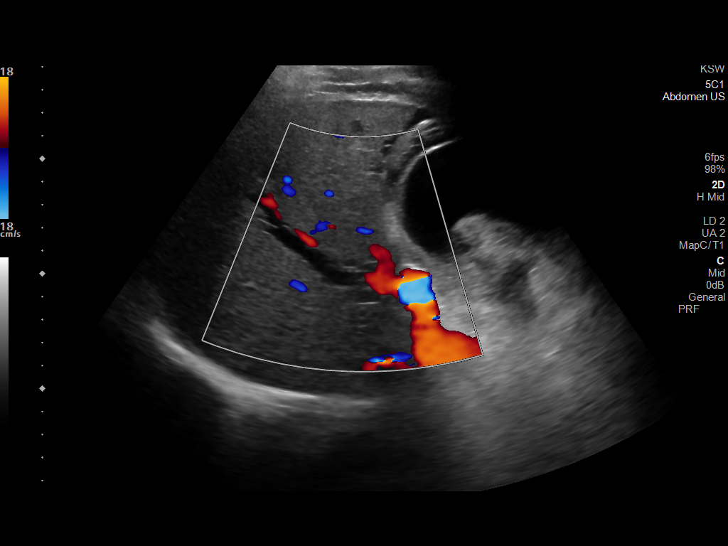
[im 36/106]
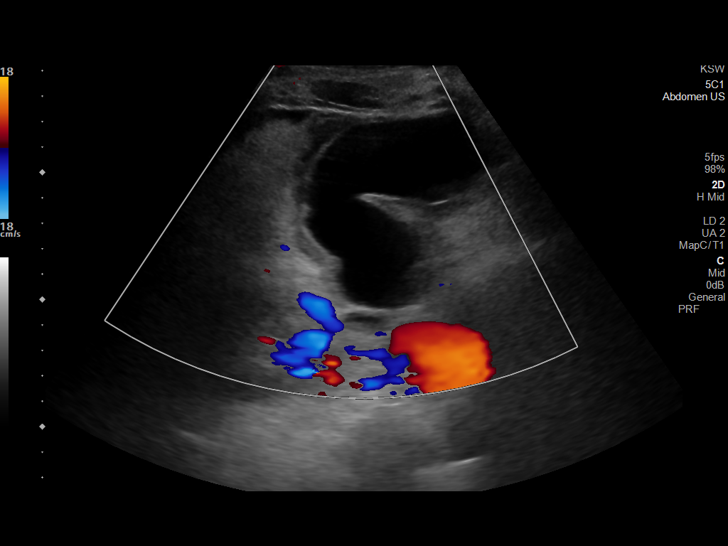
[im 44/106]
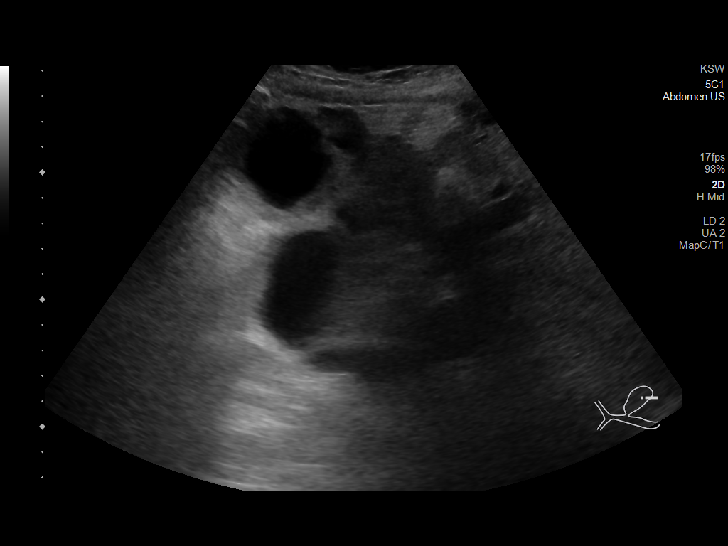
[im 53/106]
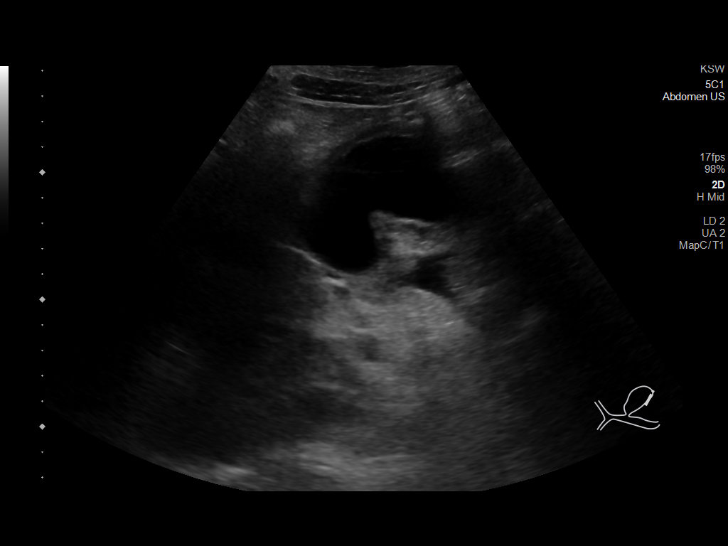
[im 62/106]
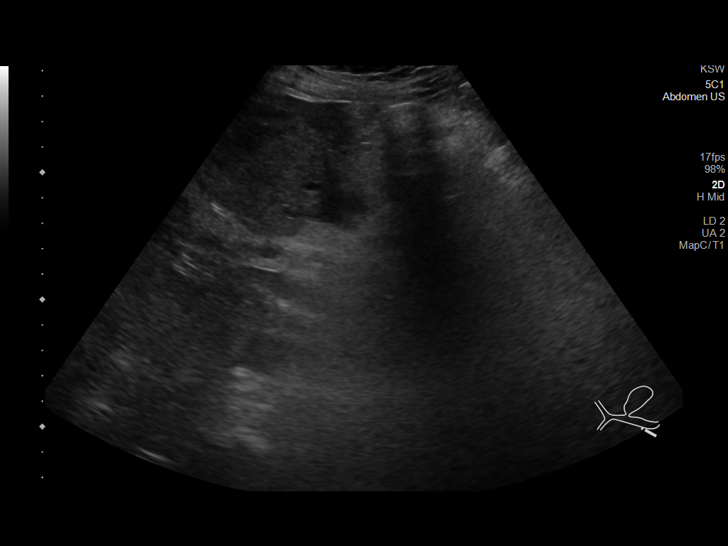
[im 71/106]
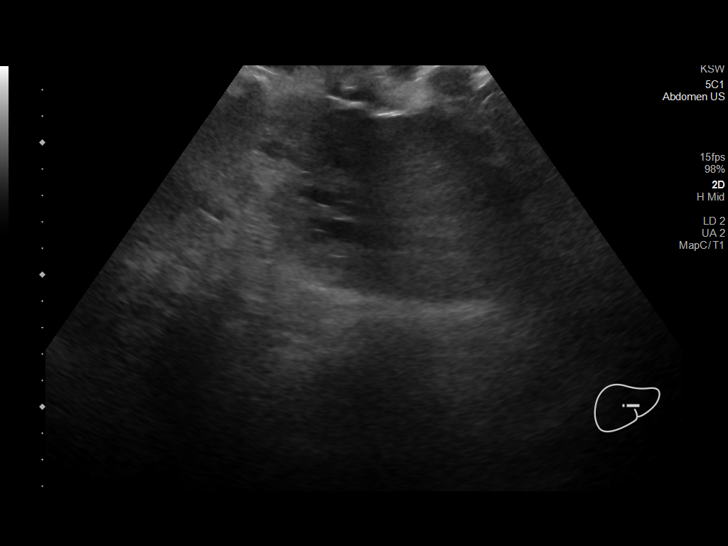
[im 79/106]
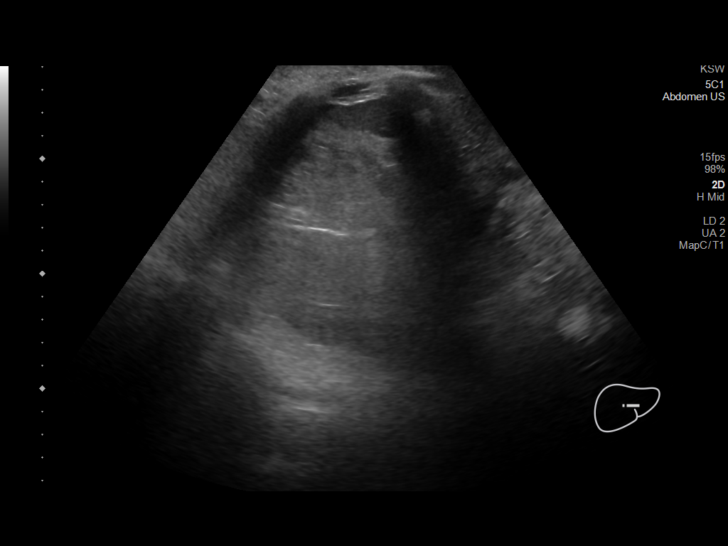
[im 88/106]
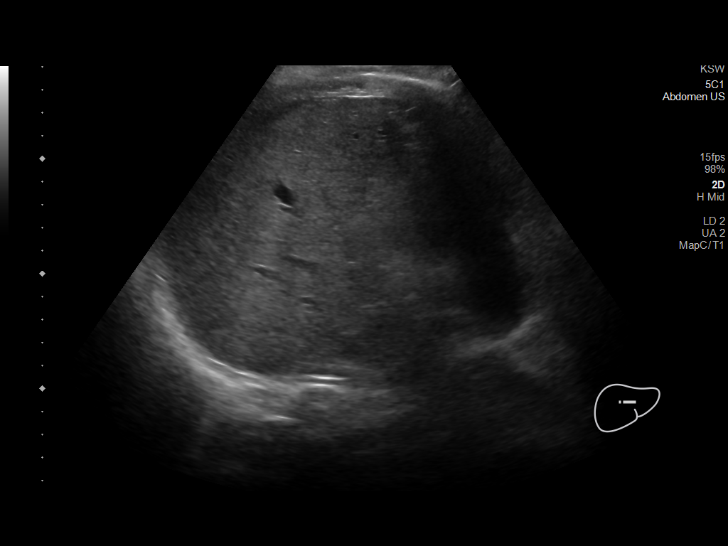
[im 97/106]
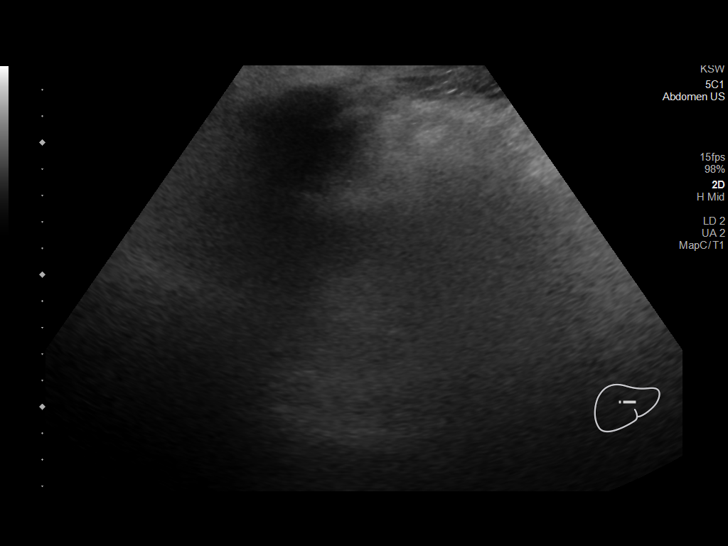
[im 106/106]
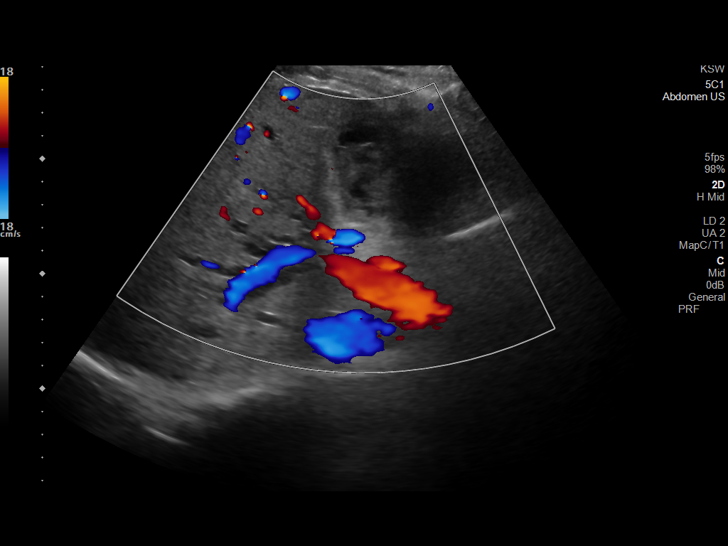

[13 of 25 positions shown; findings below may reference images not displayed]

FINDINGS: Gallbladder:

Distended with edematous gallbladder wall thickening measuring up to
6 mm. No shadowing gallstone. No intraluminal sludge. No sonographic
Murphy sign noted by sonographer.

Common bile duct:

Diameter: 7 mm, upper normal for age.

Liver:

There is intrahepatic biliary ductal dilatation. Hepatic parenchyma
is heterogeneous, and mildly increased. There is capsular
nodularity. Liver lesions on prior ultrasound are not well seen on
the current exam. Portal vein is patent on color Doppler imaging
with normal direction of blood flow towards the liver.

Other: Small volume right upper quadrant ascites and pericholecystic
fluid.
IMPRESSION: 1. Distended gallbladder with edematous gallbladder wall thickening.
No gallstones or sludge. Findings may be related to underlying
chronic liver disease with ascites in the right upper quadrant. If
there is clinical concern for acute cholecystitis, recommend further
evaluation with nuclear medicine HIDA scan.
2. Intrahepatic biliary ductal dilatation with upper normal common
bile duct.
3. Heterogeneous hepatic parenchyma with increased echogenicity and
suggestion of capsular nodularity. Findings suspicious for
cirrhosis. Liver lesions on [YR] ultrasound are not well seen on the
current exam.

## 2019-09-27 MED ORDER — SODIUM ZIRCONIUM CYCLOSILICATE 5 G PO PACK: 5.0000 g | PACK | Freq: Once | ORAL | Status: DC

## 2019-09-27 MED ORDER — BISACODYL 10 MG RE SUPP: 10.0000 mg | Freq: Every day | RECTAL | Status: DC | PRN

## 2019-09-27 NOTE — Progress Notes (Signed)
Subjective: Patient feels pain is about the same. Complains of soreness OS.  EXAMINATION  VAsc OS:NLP  Pupils:  XM:IWOEH not visible due to complete hyphema  T(Pen): OS:4 mm Hg previously (tonopen not reading 09/27/19)   Anterior SegmentExam (patient lying in bed, exam with penlight/indirect/20D lens): Ext/Lids:R:normalL:+lidedema/ecchymoses, no lid laceration Conj/Sclera: ODwhite and quiet OZ:YYQMGNO sutures intact. ?Spot nasally of conjunctival breakdown. 360 subconj hemorrhage. 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 We have discussed evisceration or enucleation may be needed if the eye is not able to heal and stabilize - eye examinations at bedside are limited in an inpatient setting-- once patient is cleared medically to discharge from the hospital outpatient follow-up would certainly aid this - eyeshield at all times - We discussed the visual prognosis for the eye is extremely guardedas before - AKI during hospital stay and Vanc stopped. Now on Levofloxacin PO and Doxycycline PO to finish prophylactic antibiotic course - Eye drops as below - gatifloxacin QID OS - Pred acetate 1% QID OS -  Neo/Poly/Dex ointment QID OS - Please space drops and ointment by at least 5 minutes, placing the ointment last, and then resecure/retape the [facility]

## 2019-09-27 NOTE — Progress Notes (Signed)
Patient ID: Maxxwell Edgett, male   DOB: Jan 21, 1945, 74 y.o.   MRN: [id number] S: no new complaints. O:BP 116/73 (BP Location: Right Arm)   Pulse 97   Temp 98.3 F (36.8 C)   Resp 18   Ht 5\' 9"  (1.753 m)   Wt 101.6 kg   SpO2 96%   BMI 33.08 kg/m   Intake/Output Summary (Last 24 hours) at 09/27/2019 1222 Last data filed at 09/27/2019 0505 Gross per 24 hour  Intake 240 ml  Output 1850 ml  Net -1610 ml   Intake/Output: I/O last 3 completed shifts: In: 600 [P.O.:600] Out: 3000 [Urine:3000]  Intake/Output this shift:  No intake/output data recorded. Weight change: 0.002 kg Gen: NAD CVS: RRR, no rub Resp: cta Abd: +BS, soft, NT/ND Ext: 1+ edema bilateral lower extremities  Recent Labs  Lab 09/21/19 0214 09/22/19 0736 09/23/19 0455 09/24/19 0247 09/25/19 0618 09/26/19 1045 09/27/19 1050  NA 126* 128* 131* 133* 136 137 137  K 4.2 3.9 4.7 4.7 4.5 5.0 5.3*  CL 96* 100 100 100 106 107 106  CO2 20* 19* 21* 20* 20* 19* 22  GLUCOSE 95 102* 135* 132* 128* 170* 150*  BUN 38* 53* 63* 75* 82* 89* 88*  CREATININE 4.01* 4.40* 5.05* 4.92* 4.28* 4.04* 3.51*  ALBUMIN  --   --  1.7* 1.7* 1.5* 1.8* 1.6*  CALCIUM 8.0* 8.0* 8.2* 8.3* 8.3* 8.3* 8.1*  PHOS  --  6.2* 7.1* 6.3* 5.6* 6.0* 5.4*  AST  --   --  136* 106* 99* 127* 109*  ALT  --   --  13 17 25  45* 41   Liver Function Tests: Recent Labs  Lab 09/25/19 0618 09/26/19 1045 09/27/19 1050  AST 99* 127* 109*  ALT 25 45* 41  ALKPHOS 155* 166* 130*  BILITOT 5.1* 5.6* 5.1*  PROT 6.5 7.5 6.3*  ALBUMIN 1.5* 1.8* 1.6*   No results for input(s): LIPASE, AMYLASE in the last 168 hours. No results for input(s): AMMONIA in the last 168 hours. CBC: Recent Labs  Lab 09/22/19 0736 09/22/19 0736 09/23/19 0455 09/23/19 0455 09/24/19 0247 09/25/19 0618 09/26/19 1045  WBC 7.1  --  7.1  --  8.3  --   --   HGB 11.7*   < > 12.3*   < > 12.2* 11.9* 12.9*  HCT 33.4*   < > 33.8*   < > 34.2* 32.9* 37.2*  MCV 96.5  --  96.6  --  95.5  --    --   PLT 117*  --  132*  --  113*  --   --    < > = values in this interval not displayed.   Cardiac Enzymes: Recent Labs  Lab 09/23/19 0455  CKTOTAL 149   CBG: No results for input(s): GLUCAP in the last 168 hours.  Iron Studies: No results for input(s): IRON, TIBC, TRANSFERRIN, FERRITIN in the last 72 hours. Studies/Results: No results found. . Chlorhexidine Gluconate Cloth  6 each Topical Daily  . doxycycline  100 mg Oral Q12H  . DULoxetine  60 mg Oral Daily  . finasteride  5 mg Oral Daily  . gatifloxacin  1 drop Left Eye QID  . hydrocortisone sod succinate (SOLU-CORTEF) inj  100 mg Intravenous Q8H  . levofloxacin  500 mg Oral Q48H  . loratadine  10 mg Oral Daily  . mirabegron ER  50 mg Oral Daily  . neomycin-polymyxin b-dexamethasone  1 application Left Eye QID  . prednisoLONE acetate  1  drop Left Eye QID  . senna-docusate  1 tablet Oral BID    BMET    Component Value Date/Time   NA 137 09/27/2019 1050   K 5.3 (H) 09/27/2019 1050   CL 106 09/27/2019 1050   CO2 22 09/27/2019 1050   GLUCOSE 150 (H) 09/27/2019 1050   BUN 88 (H) 09/27/2019 1050   CREATININE 3.51 (H) 09/27/2019 1050   CALCIUM 8.1 (L) 09/27/2019 1050   GFRNONAA 16 (L) 09/27/2019 1050   GFRAA 19 (L) 09/27/2019 1050   CBC    Component Value Date/Time   WBC 8.3 09/24/2019 0247   RBC 3.58 (L) 09/24/2019 0247   HGB 12.9 (L) 09/26/2019 1045   HCT 37.2 (L) 09/26/2019 1045   PLT 113 (L) 09/24/2019 0247   MCV 95.5 09/24/2019 0247   MCH 34.1 (H) 09/24/2019 0247   MCHC 35.7 09/24/2019 0247   RDW 18.5 (H) 09/24/2019 0247   LYMPHSABS 1.8 09/18/2019 1811   MONOABS 0.9 09/18/2019 1811   EOSABS 0.2 09/18/2019 1811   BASOSABS 0.1 09/18/2019 1811    Assessment/Plan:  1. AKI, non-oliguric, multifactorial, in setting of hemodynamically mediated ischemic ATN (hypotension, hypovolemia, IV vanco(trough of 35 on 09/20/19),and urinary retention in setting of ACE inhibitor).  1. UOP hassignificantly increased  afterfoley catheter placement due to BOO. 2. Scr slowly improving. 3. Continue to follow UOP and daily BUN/Cr 4. No uremic symptoms or indication for urgent dialysis. 5. Net negative 2 liters and may be having post-obstructive diuresis. If continues may need to start IVF's to prevent ATN. 2. 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 ophthamology performed surgical repair on 9/16. 3. HTN- on low side and taken off of lisinopril 9/18. 4. Hyponatremia- resolved 5. Hyperkalemia- mild.  Cont to follow.  6. Urinary retention-s/p Foley catheter 09/24/19. 7. Abnormal LFT's- increasedt. Bilirubin and elevated AST. Continue to follow LFT's 1. Recommend imaging of liver given persistently elevated LFT's and t bili.  8. Constipation- per pt no BM since last Wednesday. Consider laxatives. Per primary.  Donetta Potts, MD [employer] [phone]

## 2019-09-27 NOTE — Care Management Important Message (Signed)
Important Message  Patient Details  Name: Nicholas Ramirez MRN: [id number] Date of Birth: [birth year]   Medicare Important Message Given:  Yes - Important Message mailed due to current National Emergency  Verbal consent obtained due to current National Emergency  Relationship to patient: Child Contact Name: Emeline Gins Call Date: 09/27/19  Time: 24 Phone: [phone] Outcome: Spoke with contact Important Message mailed to: Patient address on file    [place] 09/27/2019, 11:02 AM

## 2019-09-27 NOTE — Progress Notes (Signed)
PROGRESS NOTE    Nicholas Ramirez  VHQ:[id number] DOB: 25-Dec-1945 DOA: 09/18/2019 PCP: Townsend Roger, MD  Brief Narrative:  Patient is a 74 year old male with HTN, heart murmur, cataracts, hepatitis C presented to [place] a fall at home.  Patient had multiple falls over the last few months at home. He has been evaluated at outpatient neurology (Dr Tomi Likens) and has had CT scan and MRI of his brain performed. He reported he has had generalized weakness with a slow unsteady gait. He was recently started on levodopa, carbidopa for possible Parkinson's disease by neurology.He fell on the dresser and has sustained a laceration to the side of his eye and hit his eye. He was unable to see and EMS was called. He reports that he had normal vision with his glasses in both eyes until this fall. He reports he cannot see out of the left eye after his fall. He was found to have hyphema and possible hematoma in left eye on CT scan.He denies any loss of consciousness with the fall. CT scan of his orbits shows probable rupture of anterior left globe. Ophthalmology has been consulted and  He underwent surgical repair 9/16. ophthalmology recommended broad spectrum antibiotics. He has developed AKI , Nephrology consulted. Vancomycin discontinued, He has developed urinary retention, requiring foley catheter. Renal functions are slowly improving. Continued on supportive care, No need for hemodialysis at this time.    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 -Patient was evaluated by ophthalmology, status post surgical repair on 9/16 am. -Ophthalmology following, appreciate Dr. Roda Shutters recommendations,  visual prognosis for the eye is extremely guarded, eye shield at all times -Per recommendations, continue broad-spectrum IV antibiotics and transitioning to p.o. broad-spectrum on discharge, eyedrops, follow-up with ophthalmology as outpatient. -Antibiotic changed to levofloxacin and doxycycline p.o.   Active Problems: Accelerated hypertension - BP on admission elevated however been normal. , will hold off on lisinopril, and amlodipine.   - Hold propranolol.  Acute kidney injury :  Multifactorial : -Patient did have an episode of acute urinary retention , has history of BPH -Creatinine 2.71-> 4.1 , held vancomycin, lisinopril, avoid hypotension. -Placed on IV fluid hydration, renal ultrasound ruled out any obstruction or hydronephrosis -Resumed finasteride, mirabegron ER  -Recheck BMP showed worsening renal functions, Nephrology consulted. -Patient needed foley catheter , since he continues to have urinary retention. -No indication for Hemodialysis at this time, continue supportive care. - Given stress dose of steroids. - serum creatinine peaked 5.05 trending down 4.92 > 4.28 > 4.08> 3.51 -K 5.3, mild  Nephrology recommended continue to monitor.  Mechanical fall, recurrent falls at home, recent diagnosis of Parkinson's -PT/OT evaluation recommended CIR, consult placed -Reviewed recent neurology note by Dr. 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 - started Sinemet, discussed with the patient and daughter.   Hyponatremia >>> Resolved -Serum osmolality 275, urine osmolality 41, urine sodium 108, SIADH component  -IV fluids were held 9/17. Nephrology consulted, recommended gentle hydration x 12 hrs. -Sodium improved to 136.  Prolonged QT interval Avoid QT prolonging  medications -Monitor K, magnesium  Constipation : Senokot/ colace  bid  Tap water enema x2   Obesity Estimated body mass index is 38.19 kg/m as calculated from the following:   Height as of this encounter: 5\' 9"  (1.753 m).   Weight as of this encounter: 117.3 kg.   DVT prophylaxis:SCDs. Code Status: Full Family Communication: Spoke with daughter at bed side. Disposition Plan:  Status is: Inpatient  Remains inpatient appropriate because:Inpatient level of care appropriate due to severity of illness   Dispo: The patient is from: Home              Anticipated d/c is to: CIR              Anticipated d/c date is: 3 days              Patient currently is not medically stable to d/c.  Patient's insurance has required peer to peer review for disposition to the rehab.  Peer to peer completed.  Patient got approved for subacute rehab but denied inpatient rehab,  Patient and family decided to appeal for that decision.   Consultants:   Ophthalmology, nephrology  Procedures: Antimicrobials:  Anti-infectives (From admission, onward)   Start     Dose/Rate Route Frequency Ordered Stop   09/25/19 1000  doxycycline (VIBRA-TABS) tablet 100 mg        100 mg Oral Every 12 hours 09/24/19 1039 10/02/19 0959   09/25/19 1000  levofloxacin (LEVAQUIN) tablet 500 mg        500 mg Oral Every 48 hours 09/24/19 1039     09/23/19 2000  DAPTOmycin (CUBICIN) 800 mg in sodium chloride 0.9 % IVPB  Status:  Discontinued        800 mg 232 mL/hr over 30 Minutes Intravenous Every 48 hours 09/23/19 1728 09/24/19 1039   09/21/19 2200  cefTAZidime (FORTAZ) 1 g in sodium chloride 0.9 % 100 mL IVPB       Note to Pharmacy: Ruptured left eye globe after fall at home and hit head on furniture   1 g 200 mL/hr over 30 Minutes Intravenous Every 24 hours 09/21/19 0731 09/24/19 2204   09/20/19 2200  cefTAZidime (FORTAZ) 1 g in sodium chloride 0.9 % 100 mL IVPB  Status:  Discontinued       Note to Pharmacy: Ruptured  left eye globe after fall at home and hit head on furniture   1 g 200 mL/hr over 30 Minutes Intravenous Every 12 hours 09/20/19 1255 09/21/19 0731   09/20/19 1231  vancomycin variable dose per unstable renal function (pharmacist dosing)  Status:  Discontinued         Does not apply See admin instructions 09/20/19 1231 09/20/19 1242   09/19/19 0800  vancomycin (VANCOCIN) IVPB 1000 mg/200 mL premix  Status:  Discontinued        1,000 mg 200 mL/hr over 60 Minutes Intravenous Every 12 hours 09/18/19 1932 09/20/19 1231   09/19/19 0300  cefTAZidime (FORTAZ) 1 g in sodium chloride 0.9 % 100 mL IVPB  Status:  Discontinued       Note to Pharmacy: Ruptured left eye globe after fall at home and hit head on furniture   1 g 200 mL/hr over 30 Minutes Intravenous Every 8 hours 09/19/19 0138 09/20/19 1255   09/18/19 2304  polymyxin 500,000 units/ceftazidime 1 g ophthalmic irrigation  Status:  Discontinued          As needed 09/18/19  2304 09/19/19 0044   09/18/19 2000  vancomycin (VANCOCIN) 2,500 mg in sodium chloride 0.9 % 500 mL IVPB        2,500 mg 250 mL/hr over 120 Minutes Intravenous  Once 09/18/19 1903 09/18/19 2243   09/18/19 1845  cefTAZidime (FORTAZ) 2 g in sodium chloride 0.9 % 100 mL IVPB        2 g 200 mL/hr over 30 Minutes Intravenous  Once 09/18/19 1809 09/18/19 1916      Subjective: Patient was seen and examined at bedside.  Overnight events noted ,  Patient reports feeling better, Lying on the bed , reports pain is better controlled but he still has not had a bowel movement even with enema.  Left eye covered with shield.  Objective: Vitals:   09/26/19 0858 09/26/19 2108 09/27/19 0505 09/27/19 0936  BP: 134/83 134/81 (!) 128/93 116/73  Pulse: 95 92 90 97  Resp: 18 16 16 18   Temp: 98.1 F (36.7 C) 98.6 F (37 C) 97.9 F (36.6 C) 98.3 F (36.8 C)  TempSrc: Oral Oral    SpO2: 95% 95% 94% 96%  Weight:  101.6 kg    Height:        Intake/Output Summary (Last 24 hours) at  09/27/2019 1604 Last data filed at 09/27/2019 1253 Gross per 24 hour  Intake 120 ml  Output 1850 ml  Net -1730 ml   Filed Weights   09/25/19 0500 09/25/19 2133 09/26/19 2108  Weight: 101.6 kg 101.6 kg 101.6 kg    Examination:  General exam: Appears calm and comfortable , Left eye covered with shield. Respiratory system: Clear to auscultation. Respiratory effort normal.Good air entry  Cardiovascular system: S1 & S2 heard, RRR. No JVD , Murmer +,  No rubs, gallops or clicks.  Gastrointestinal system: Abdomen is distended, soft and nontender. No organomegaly or masses felt.  Normal bowel sounds heard. Central nervous system: Alert and oriented. No focal neurological deficits. Extremities:  No cyanosis, no clubbing,  Pitting edema+ noted Skin: No rashes, lesions or ulcers Psychiatry: Judgement and insight appear normal. Mood & affect appropriate.     Data Reviewed: I have personally reviewed following labs and imaging studies  CBC: Recent Labs  Lab 09/22/19 0736 09/22/19 0736 09/23/19 0455 09/24/19 0247 09/25/19 0618 09/26/19 1045 09/27/19 1050  WBC 7.1  --  7.1 8.3  --   --  8.5  HGB 11.7*   < > 12.3* 12.2* 11.9* 12.9* 12.0*  HCT 33.4*   < > 33.8* 34.2* 32.9* 37.2* 33.8*  MCV 96.5  --  96.6 95.5  --   --  98.8  PLT 117*  --  132* 113*  --   --  94*   < > = values in this interval not displayed.   Basic Metabolic Panel: Recent Labs  Lab 09/23/19 0455 09/24/19 0247 09/25/19 0618 09/26/19 1045 09/27/19 1050  NA 131* 133* 136 137 137  K 4.7 4.7 4.5 5.0 5.3*  CL 100 100 106 107 106  CO2 21* 20* 20* 19* 22  GLUCOSE 135* 132* 128* 170* 150*  BUN 63* 75* 82* 89* 88*  CREATININE 5.05* 4.92* 4.28* 4.04* 3.51*  CALCIUM 8.2* 8.3* 8.3* 8.3* 8.1*  MG 2.3 2.4 2.3 2.2 2.1  PHOS 7.1* 6.3* 5.6* 6.0* 5.4*   GFR: Estimated Creatinine Clearance: 22 mL/min (A) (by C-G formula based on SCr of 3.51 mg/dL (H)). Liver Function Tests: Recent Labs  Lab 09/23/19 0455 09/24/19 0247  09/25/19 0618 09/26/19 1045 09/27/19  1050  AST 136* 106* 99* 127* 109*  ALT 13 17 25  45* 41  ALKPHOS 201* 183* 155* 166* 130*  BILITOT 6.4* 5.7* 5.1* 5.6* 5.1*  PROT 7.1 6.8 6.5 7.5 6.3*  ALBUMIN 1.7* 1.7* 1.5* 1.8* 1.6*   No results for input(s): LIPASE, AMYLASE in the last 168 hours. No results for input(s): AMMONIA in the last 168 hours. Coagulation Profile: No results for input(s): INR, PROTIME in the last 168 hours. Cardiac Enzymes: Recent Labs  Lab 09/23/19 0455  CKTOTAL 149   BNP (last 3 results) No results for input(s): PROBNP in the last 8760 hours. HbA1C: No results for input(s): HGBA1C in the last 72 hours. CBG: No results for input(s): GLUCAP in the last 168 hours. Lipid Profile: No results for input(s): CHOL, HDL, LDLCALC, TRIG, CHOLHDL, LDLDIRECT in the last 72 hours. Thyroid Function Tests: No results for input(s): TSH, T4TOTAL, FREET4, T3FREE, THYROIDAB in the last 72 hours. Anemia Panel: No results for input(s): VITAMINB12, FOLATE, FERRITIN, TIBC, IRON, RETICCTPCT in the last 72 hours. Sepsis Labs: No results for input(s): PROCALCITON, LATICACIDVEN in the last 168 hours.  Recent Results (from the past 240 hour(s))  SARS Coronavirus 2 by RT PCR (hospital order, performed in [facility] hospital lab) Nasopharyngeal Nasopharyngeal Swab     Status: None   Collection Time: 09/18/19  6:00 PM   Specimen: Nasopharyngeal Swab  Result Value Ref Range Status   SARS Coronavirus 2 NEGATIVE NEGATIVE Final    Comment: (NOTE) SARS-CoV-2 target nucleic acids are NOT DETECTED.  The SARS-CoV-2 RNA is generally detectable in upper and lower respiratory specimens during the acute phase of infection. The lowest concentration of SARS-CoV-2 viral copies this assay can detect is 250 copies / mL. A negative result does not preclude SARS-CoV-2 infection and should not be used as the sole basis for treatment or other patient management decisions.  A negative result may occur  with improper specimen collection / handling, submission of specimen other than nasopharyngeal swab, presence of viral mutation(s) within the areas targeted by this assay, and inadequate number of viral copies (<250 copies / mL). A negative result must be combined with clinical observations, patient history, and epidemiological information.  Fact Sheet for Patients:   StrictlyIdeas.no  Fact Sheet for Healthcare Providers: BankingDealers.co.za  This test is not yet approved or  cleared by the [place] FDA and has been authorized for detection and/or diagnosis of SARS-CoV-2 by FDA under an Emergency Use Authorization (EUA).  This EUA will remain in effect (meaning this test can be used) for the duration of the COVID-19 declaration under Section 564(b)(1) of the Act, 21 U.S.C. section 360bbb-3(b)(1), unless the authorization is terminated or revoked sooner.  Performed at [facility], [place] [address], [place], [place] [address]      Radiology Studies: No results found.  Scheduled Meds: . Chlorhexidine Gluconate Cloth  6 each Topical Daily  . doxycycline  100 mg Oral Q12H  . DULoxetine  60 mg Oral Daily  . finasteride  5 mg Oral Daily  . gatifloxacin  1 drop Left Eye QID  . hydrocortisone sod succinate (SOLU-CORTEF) inj  100 mg Intravenous Q8H  . levofloxacin  500 mg Oral Q48H  . loratadine  10 mg Oral Daily  . mirabegron ER  50 mg Oral Daily  . neomycin-polymyxin b-dexamethasone  1 application Left Eye QID  . prednisoLONE acetate  1 drop Left Eye QID  . senna-docusate  1 tablet Oral BID   Continuous  Infusions:    LOS: 9 days    Time spent:  25 mins.    Shawna Clamp, MD [facility]   If 7PM-7AM, please contact night-coverage

## 2019-09-27 NOTE — Progress Notes (Signed)
Occupational Therapy Treatment Patient Details Name: Nicholas Ramirez MRN: [id number] DOB: [birth year] Today's Date: 09/27/2019    History of present illness The pt is a 74 yo male presenting after a fall at home that resulted in injury to his L eye (rupture of anterior left globe). PMH includes: HTN, heart murmur, cataracts, hepatitis C, and ongoing workup for Parkinson's Disease with outpatient neurology.   OT comments  Pt supine in bed with daughter in room. Pt rolled to left side with min A and completed supine <> sit with max A (+2 for safety). Pt required support (from min guard to max A) for trunk balance while seated EOB as well as verbal and physical cueing to correct anterior lateral lean to the left during ADL's completed during tx session. Pt required more assist for balance while completing functional tasks. Pt participated in grooming and LE bathing tasks during today's tx session with overall max A +1 physically and +2 for safety. Pt remains highly motivated. Continue to recommend CIR.   Follow Up Recommendations  CIR;Supervision/Assistance - 24 hour    Equipment Recommendations  Other (comment) (TBD at next venue of care)    Recommendations for Other Services      Precautions / Restrictions Precautions Precautions: Fall Precaution Comments: pt admitted for a fall, daughter reports 15-20 in last 6 months Restrictions Weight Bearing Restrictions: No       Mobility Bed Mobility Overal bed mobility: Needs Assistance Bed Mobility: Rolling;Supine to Sit;Sit to Supine Rolling: Min assist   Supine to sit: Max assist;+2 for safety/equipment Sit to supine: Max assist;+2 for safety/equipment   General bed mobility comments: pt sidelying upon entry on L side, transitoned to EOB with increased time/cueing to initate movement of BLEs towards EOB and max assist for trunk; returned to sidelying with cueing for trunkal initation and BLE support to ascend into bed  Transfers                  General transfer comment: NT     Balance Overall balance assessment: Needs assistance Sitting-balance support: Single extremity supported;Feet supported;No upper extremity supported Sitting balance-Leahy Scale: Fair Sitting balance - Comments: relies on at least 1 UE support (R UE), with 0 UE support during ADL tasks pt demonstrating L lateral and anterior lean with constant cueing to correct posture Postural control: Left lateral lean (anterior )                                 ADL either performed or assessed with clinical judgement   ADL Overall ADL's : Needs assistance/impaired     Grooming: Maximal assistance;Sitting;Oral care Grooming Details (indicate cue type and reason): brushed teeth while seated EOB, support for balance. required verbal and physical cueing to correct anterior lateral lean to the left     Lower Body Bathing: Maximal assistance;Sitting/lateral leans Lower Body Bathing Details (indicate cue type and reason): max assist to maintain balance while donning lotion to B lower legs due to L lateral and anterior lean                        General ADL Comments: limited by decreased funcitonal use of L UE, impaired sitting balance with anterior lateral lean to the left, decreased activity tolerance, and impaired cognition     Vision   Additional Comments: L eye shield in place,  bloody drainage    Perception  Praxis      Cognition Arousal/Alertness: Awake/alert Behavior During Therapy: WFL for tasks assessed/performed Overall Cognitive Status: Impaired/Different from baseline Area of Impairment: Attention;Following commands;Safety/judgement;Problem solving;Awareness                   Current Attention Level: Sustained   Following Commands: Follows multi-step commands inconsistently;Follows one step commands with increased time Safety/Judgement: Decreased awareness of safety;Decreased awareness of  deficits Awareness: Emergent Problem Solving: Slow processing;Decreased initiation;Difficulty sequencing;Requires tactile cues;Requires verbal cues General Comments: better initiation to reach with R UE for rail while seated. still required verbal and physical cues        Exercises     Shoulder Instructions       General Comments daughter present and supportive    Pertinent Vitals/ Pain       Pain Assessment: No/denies pain  Home Living                                          Prior Functioning/Environment              Frequency  Min 2X/week        Progress Toward Goals  OT Goals(current goals can now be found in the care plan section)  Progress towards OT goals: Progressing toward goals  Acute Rehab OT Goals Patient Stated Goal: fall less OT Goal Formulation: With patient  Plan Discharge plan remains appropriate;Frequency remains appropriate    Co-evaluation                 AM-PAC OT "6 Clicks" Daily Activity     Outcome Measure   Help from another person eating meals?: A Little Help from another person taking care of personal grooming?: A Lot Help from another person toileting, which includes using toliet, bedpan, or urinal?: A Lot Help from another person bathing (including washing, rinsing, drying)?: A Lot Help from another person to put on and taking off regular upper body clothing?: A Lot Help from another person to put on and taking off regular lower body clothing?: A Lot 6 Click Score: 13    End of Session    OT Visit Diagnosis: Other abnormalities of gait and mobility (R26.89);Muscle weakness (generalized) (M62.81);Repeated falls (R29.6);Other symptoms and signs involving cognitive function   Activity Tolerance Patient tolerated treatment well   Patient Left in bed;with call bell/phone within reach;with family/visitor present   Nurse Communication Mobility status        Time: 0349-1791 OT Time Calculation  (min): 26 min  Charges: OT General Charges $OT Visit: 1 Visit OT Treatments $Self Care/Home Management : 23-37 mins  Evert Kohl, MOTR/L Acute Rehabilitation Services - Relief OT Office: [place] 09/27/2019, 3:03 PM

## 2019-09-28 LAB — COMPREHENSIVE METABOLIC PANEL
ALT: 70 U/L — ABNORMAL HIGH (ref 0–44)
AST: 140 U/L — ABNORMAL HIGH (ref 15–41)
Albumin: 1.6 g/dL — ABNORMAL LOW (ref 3.5–5.0)
Alkaline Phosphatase: 134 U/L — ABNORMAL HIGH (ref 38–126)
Anion gap: 8 (ref 5–15)
BUN: 86 mg/dL — ABNORMAL HIGH (ref 8–23)
CO2: 24 mmol/L (ref 22–32)
Calcium: 8.1 mg/dL — ABNORMAL LOW (ref 8.9–10.3)
Chloride: 107 mmol/L (ref 98–111)
Creatinine, Ser: 3.29 mg/dL — ABNORMAL HIGH (ref 0.61–1.24)
GFR calc Af Amer: 20 mL/min — ABNORMAL LOW (ref >60)
GFR calc non Af Amer: 18 mL/min — ABNORMAL LOW (ref >60)
Glucose, Bld: 120 mg/dL — ABNORMAL HIGH (ref 70–99)
Potassium: 6 mmol/L — ABNORMAL HIGH (ref 3.5–5.1)
Sodium: 139 mmol/L (ref 135–145)
Total Bilirubin: 5.3 mg/dL — ABNORMAL HIGH (ref 0.3–1.2)
Total Protein: 6.1 g/dL — ABNORMAL LOW (ref 6.5–8.1)

## 2019-09-28 LAB — HEMOGLOBIN AND HEMATOCRIT, BLOOD
HCT: 36.1 % — ABNORMAL LOW (ref 39.0–52.0)
Hemoglobin: 12.6 g/dL — ABNORMAL LOW (ref 13.0–17.0)

## 2019-09-28 MED ORDER — HYDROCORTISONE NA SUCCINATE PF 100 MG IJ SOLR
100.0000 mg | Freq: Three times a day (TID) | INTRAMUSCULAR | Status: DC
  Administered 2019-09-28 – 2019-09-30 (×7): 100 mg via INTRAVENOUS
  Filled 2019-09-28 (×7): qty 2

## 2019-09-28 MED ORDER — SODIUM ZIRCONIUM CYCLOSILICATE 10 G PO PACK
10.0000 g | PACK | Freq: Once | ORAL | Status: AC
  Administered 2019-09-28: 10 g via ORAL
  Filled 2019-09-28: qty 1

## 2019-09-28 MED ORDER — SODIUM ZIRCONIUM CYCLOSILICATE 10 G PO PACK
10.0000 g | PACK | Freq: Every day | ORAL | Status: DC
  Administered 2019-09-29 – 2019-09-30 (×2): 10 g via ORAL
  Filled 2019-09-28 (×2): qty 1

## 2019-09-28 NOTE — Progress Notes (Signed)
PROGRESS NOTE    Nicholas Ramirez  GXQ:[id number] DOB: 1945/07/13 DOA: 09/18/2019 PCP: Townsend Roger, MD  Brief Narrative:  Patient is a 74 year old male with HTN, heart murmur, cataracts, hepatitis C presented to [facility] a fall at home. Patient had multiple falls over the last few months at home. He has been evaluated at outpatient neurology (Dr Tomi Likens) and has had CT scan and MRI of his brain performed. He reported he has had generalized weakness with a slow unsteady gait. He was recently started on levodopa, carbidopa for possible Parkinson's disease by neurology.He fell on the dresser and has sustained a laceration to the side of his eye and hit his eye. He was unable to see and EMS was called. He reports that he had normal vision with his glasses in both eyes until this fall. He reports he cannot see out of the left eye after his fall. He was found to have hyphema and possible hematoma in left eye on CT scan.He denies any loss of consciousness with the fall. CT scan of his orbits shows probable rupture of anterior left globe. Ophthalmology has been consulted and  He underwent surgical repair 9/16. ophthalmology recommended broad spectrum antibiotics. He has developed AKI , Nephrology consulted. Vancomycin discontinued, He has developed urinary retention, requiring foley catheter. Renal functions are slowly improving. Continued on supportive care, No need for hemodialysis at this time.  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 -Patient was evaluated by ophthalmology, status post surgical repair on 9/16 am. -Ophthalmology following, appreciate Dr.  Roda Shutters recommendations, visual prognosis for the eye is extremely guarded, eye shield at all times -Per recommendations, continue broad-spectrum IV antibiotics and transitioning to p.o. broad-spectrum on discharge, eyedrops, follow-up with ophthalmology as outpatient. -Antibiotic changed to levofloxacin and doxycycline p.o.   Active Problems: Accelerated hypertension - BP on admission elevated however been normal. , will hold off on lisinopril, and amlodipine.   - Hold propranolol.  Acute kidney injury :  Multifactorial : -Patient did have an episode of acute urinary retention , has history of BPH -Creatinine 2.71-> 4.1 , held vancomycin, lisinopril, avoid hypotension. -Placed on IV fluid hydration, renal ultrasound ruled out any obstruction or hydronephrosis -Resumed finasteride, mirabegron ER  -Recheck BMP showed worsening renal functions, Nephrology consulted. -Patient needed foley catheter , since he continues to have urinary retention. -No indication for Hemodialysis at this time, continue supportive care. -Given stress dose of steroids. - Serum creatinine peaked 5.05 trending down 4.92 > 4.28 > 4.08> 3.51>3.29 - K 6.0> Lokelma started, recheck am labs. - Renal functions improving   Mechanical fall, recurrent falls at home, recent diagnosis of Parkinson's -PT/OT evaluation recommended CIR, consult placed. -Reviewed recent neurology note by Dr. 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 - started Sinemet, discussed with the patient and daughter.   Hyponatremia >>> Resolved -Serum osmolality 275, urine osmolality 41, urine sodium 108, SIADH component  -IV fluids were held 9/17. Nephrology consulted, recommended gentle hydration x 12 hrs. -  Sodium improved to 139.    Prolonged QT interval Avoid QT prolonging medications -Monitor K, magnesium.  Constipation : Improving Senokot/ colace  bid  Tap water enema x2  Abnormal LFTS: US  showed  Edematous gallbladder wall thickening, no stones, sludge. Obtain HIDA scan  Obesity Estimated body mass index is 38.19 kg/m as calculated from the following:   Height as of this encounter: 5\' 9"  (1.753 m).   Weight as of this encounter: 117.3 kg.   DVT prophylaxis:SCDs. Code Status: Full Family Communication: Spoke with daughter at bed side. Disposition Plan:  Status is: Inpatient  Remains inpatient appropriate because:Inpatient level of care appropriate due to severity of illness   Dispo: The patient is from: Home              Anticipated d/c is to: CIR              Anticipated d/c date is: 9/27.              Patient currently is not medically stable to d/c.  Patient's insurance has required peer to peer review for disposition to the rehab.  Peer to peer completed.  Patient got approved for subacute rehab but denied inpatient rehab,  Family appealed and has accepted in inpatient rehab.   Consultants:   Ophthalmology, nephrology  Procedures: Antimicrobials:  Anti-infectives (From admission, onward)   Start     Dose/Rate Route Frequency Ordered Stop   09/25/19 1000  doxycycline (VIBRA-TABS) tablet 100 mg        100 mg Oral Every 12 hours 09/24/19 1039 10/02/19 0959   09/25/19 1000  levofloxacin (LEVAQUIN) tablet 500 mg        500 mg Oral Every 48 hours 09/24/19 1039     09/23/19 2000  DAPTOmycin (CUBICIN) 800 mg in sodium chloride 0.9 % IVPB  Status:  Discontinued        800 mg 232 mL/hr over 30 Minutes Intravenous Every 48 hours 09/23/19 1728 09/24/19 1039   09/21/19 2200  cefTAZidime (FORTAZ) 1 g in sodium chloride 0.9 % 100 mL IVPB       Note to Pharmacy: Ruptured left eye globe after fall at home and hit head on furniture   1 g 200 mL/hr over 30 Minutes Intravenous Every 24 hours 09/21/19  0731 09/24/19 2204   09/20/19 2200  cefTAZidime (FORTAZ) 1 g in sodium chloride 0.9 % 100 mL IVPB  Status:  Discontinued       Note to Pharmacy: Ruptured left eye globe after fall at home and hit head on furniture   1 g 200 mL/hr over 30 Minutes Intravenous Every 12 hours 09/20/19 1255 09/21/19 0731   09/20/19 1231  vancomycin variable dose per unstable renal function (pharmacist dosing)  Status:  Discontinued         Does not apply See admin instructions 09/20/19 1231 09/20/19 1242   09/19/19 0800  vancomycin (VANCOCIN) IVPB 1000 mg/200 mL premix  Status:  Discontinued        1,000 mg 200 mL/hr over 60 Minutes Intravenous Every 12 hours 09/18/19 1932 09/20/19 1231   09/19/19 0300  cefTAZidime (FORTAZ) 1 g in sodium chloride 0.9 % 100 mL IVPB  Status:  Discontinued       Note to Pharmacy: Ruptured left eye globe after fall at home and hit head on furniture   1 g 200 mL/hr over 30 Minutes Intravenous Every 8 hours 09/19/19 0138 09/20/19 1255   09/18/19 2304  polymyxin 500,000  units/ceftazidime 1 g ophthalmic irrigation  Status:  Discontinued          As needed 09/18/19 2304 09/19/19 0044   09/18/19 2000  vancomycin (VANCOCIN) 2,500 mg in sodium chloride 0.9 % 500 mL IVPB        2,500 mg 250 mL/hr over 120 Minutes Intravenous  Once 09/18/19 1903 09/18/19 2243   09/18/19 1845  cefTAZidime (FORTAZ) 2 g in sodium chloride 0.9 % 100 mL IVPB        2 g 200 mL/hr over 30 Minutes Intravenous  Once 09/18/19 1809 09/18/19 1916      Subjective: Patient was seen and examined at bedside.  Overnight events noted ,  Patient reports feeling better, Lying on the bed, He reports having bowel movement with enema, pain is better controlled,  Left eye covered with shield.  Objective: Vitals:   09/27/19 1730 09/27/19 2059 09/28/19 0508 09/28/19 1003  BP: (!) 141/90 125/84 (!) 134/99 131/76  Pulse: 98 86 89 97  Resp: 18 18 16 16   Temp: 98.4 F (36.9 C) 97.7 F (36.5 C) 98.4 F (36.9 C) 98.5 F (36.9  C)  TempSrc: Oral Oral  Oral  SpO2: 97% 93% 93% 94%  Weight:      Height:        Intake/Output Summary (Last 24 hours) at 09/28/2019 1544 Last data filed at 09/28/2019 1300 Gross per 24 hour  Intake 1200 ml  Output 1950 ml  Net -750 ml   Filed Weights   09/25/19 0500 09/25/19 2133 09/26/19 2108  Weight: 101.6 kg 101.6 kg 101.6 kg    Examination:  General exam: Appears calm and comfortable , Left eye covered with shield. Respiratory system: Clear to auscultation. Respiratory effort normal.Good air entry  Cardiovascular system: S1 & S2 heard, RRR. No JVD , Murmer +,  No rubs, gallops or clicks.  Gastrointestinal system: Abdomen is distended, soft and nontender. No organomegaly or masses felt.  Normal bowel sounds heard. Central nervous system: Alert and oriented. No focal neurological deficits. Extremities:  No cyanosis, no clubbing,  Pitting edema+ noted Skin: No rashes, lesions or ulcers Psychiatry: Judgement and insight appear normal. Mood & affect appropriate.     Data Reviewed: I have personally reviewed following labs and imaging studies  CBC: Recent Labs  Lab 09/22/19 0736 09/22/19 0736 09/23/19 0455 09/23/19 0455 09/24/19 0247 09/25/19 0618 09/26/19 1045 09/27/19 1050 09/28/19 0821  WBC 7.1  --  7.1  --  8.3  --   --  8.5  --   HGB 11.7*   < > 12.3*   < > 12.2* 11.9* 12.9* 12.0* 12.6*  HCT 33.4*   < > 33.8*   < > 34.2* 32.9* 37.2* 33.8* 36.1*  MCV 96.5  --  96.6  --  95.5  --   --  98.8  --   PLT 117*  --  132*  --  113*  --   --  94*  --    < > = values in this interval not displayed.   Basic Metabolic Panel: Recent Labs  Lab 09/23/19 0455 09/23/19 0455 09/24/19 0247 09/25/19 0618 09/26/19 1045 09/27/19 1050 09/28/19 0354  NA 131*   < > 133* 136 137 137 139  K 4.7   < > 4.7 4.5 5.0 5.3* 6.0*  CL 100   < > 100 106 107 106 107  CO2 21*   < > 20* 20* 19* 22 24  GLUCOSE 135*   < > 132*  128* 170* 150* 120*  BUN 63*   < > 75* 82* 89* 88* 86*    CREATININE 5.05*   < > 4.92* 4.28* 4.04* 3.51* 3.29*  CALCIUM 8.2*   < > 8.3* 8.3* 8.3* 8.1* 8.1*  MG 2.3  --  2.4 2.3 2.2 2.1  --   PHOS 7.1*  --  6.3* 5.6* 6.0* 5.4*  --    < > = values in this interval not displayed.   GFR: Estimated Creatinine Clearance: 23.5 mL/min (A) (by C-G formula based on SCr of 3.29 mg/dL (H)). Liver Function Tests: Recent Labs  Lab 09/24/19 0247 09/25/19 0618 09/26/19 1045 09/27/19 1050 09/28/19 0354  AST 106* 99* 127* 109* 140*  ALT 17 25 45* 41 70*  ALKPHOS 183* 155* 166* 130* 134*  BILITOT 5.7* 5.1* 5.6* 5.1* 5.3*  PROT 6.8 6.5 7.5 6.3* 6.1*  ALBUMIN 1.7* 1.5* 1.8* 1.6* 1.6*   No results for input(s): LIPASE, AMYLASE in the last 168 hours. No results for input(s): AMMONIA in the last 168 hours. Coagulation Profile: No results for input(s): INR, PROTIME in the last 168 hours. Cardiac Enzymes: Recent Labs  Lab 09/23/19 0455  CKTOTAL 149   BNP (last 3 results) No results for input(s): PROBNP in the last 8760 hours. HbA1C: No results for input(s): HGBA1C in the last 72 hours. CBG: No results for input(s): GLUCAP in the last 168 hours. Lipid Profile: No results for input(s): CHOL, HDL, LDLCALC, TRIG, CHOLHDL, LDLDIRECT in the last 72 hours. Thyroid Function Tests: No results for input(s): TSH, T4TOTAL, FREET4, T3FREE, THYROIDAB in the last 72 hours. Anemia Panel: No results for input(s): VITAMINB12, FOLATE, FERRITIN, TIBC, IRON, RETICCTPCT in the last 72 hours. Sepsis Labs: No results for input(s): PROCALCITON, LATICACIDVEN in the last 168 hours.  Recent Results (from the past 240 hour(s))  SARS Coronavirus 2 by RT PCR (hospital order, performed in [facility] hospital lab) Nasopharyngeal Nasopharyngeal Swab     Status: None   Collection Time: 09/18/19  6:00 PM   Specimen: Nasopharyngeal Swab  Result Value Ref Range Status   SARS Coronavirus 2 NEGATIVE NEGATIVE Final    Comment: (NOTE) SARS-CoV-2 target nucleic acids are NOT  DETECTED.  The SARS-CoV-2 RNA is generally detectable in upper and lower respiratory specimens during the acute phase of infection. The lowest concentration of SARS-CoV-2 viral copies this assay can detect is 250 copies / mL. A negative result does not preclude SARS-CoV-2 infection and should not be used as the sole basis for treatment or other patient management decisions.  A negative result may occur with improper specimen collection / handling, submission of specimen other than nasopharyngeal swab, presence of viral mutation(s) within the areas targeted by this assay, and inadequate number of viral copies (<250 copies / mL). A negative result must be combined with clinical observations, patient history, and epidemiological information.  Fact Sheet for Patients:   StrictlyIdeas.no  Fact Sheet for Healthcare Providers: BankingDealers.co.za  This test is not yet approved or  cleared by the [place] FDA and has been authorized for detection and/or diagnosis of SARS-CoV-2 by FDA under an Emergency Use Authorization (EUA).  This EUA will remain in effect (meaning this test can be used) for the duration of the COVID-19 declaration under Section 564(b)(1) of the Act, 21 U.S.C. section 360bbb-3(b)(1), unless the authorization is terminated or revoked sooner.  Performed at [facility], [place] [address] [place], [place] [address]      Radiology Studies: US Abdomen  Limited RUQ  Result Date: 09/27/2019 CLINICAL DATA:  Elevated liver enzymes. EXAM: ULTRASOUND ABDOMEN LIMITED RIGHT UPPER QUADRANT COMPARISON:  Abdominal ultrasound 12/18/2014 FINDINGS: Gallbladder: Distended with edematous gallbladder wall thickening measuring up to 6 mm. No shadowing gallstone. No intraluminal sludge. No sonographic Murphy sign noted by sonographer. Common bile duct: Diameter: 7 mm, upper normal for age. Liver: There is intrahepatic biliary ductal  dilatation. Hepatic parenchyma is heterogeneous, and mildly increased. There is capsular nodularity. Liver lesions on prior ultrasound are not well seen on the current exam. Portal vein is patent on color Doppler imaging with normal direction of blood flow towards the liver. Other: Small volume right upper quadrant ascites and pericholecystic fluid. IMPRESSION: 1. Distended gallbladder with edematous gallbladder wall thickening. No gallstones or sludge. Findings may be related to underlying chronic liver disease with ascites in the right upper quadrant. If there is clinical concern for acute cholecystitis, recommend further evaluation with nuclear medicine HIDA scan. 2. Intrahepatic biliary ductal dilatation with upper normal common bile duct. 3. Heterogeneous hepatic parenchyma with increased echogenicity and suggestion of capsular nodularity. Findings suspicious for cirrhosis. Liver lesions on 2016 ultrasound are not well seen on the current exam. Electronically Signed   By: Keith Rake M.D.   On: 09/27/2019 21:42    Scheduled Meds: . Chlorhexidine Gluconate Cloth  6 each Topical Daily  . doxycycline  100 mg Oral Q12H  . DULoxetine  60 mg Oral Daily  . finasteride  5 mg Oral Daily  . gatifloxacin  1 drop Left Eye QID  . hydrocortisone sod succinate (SOLU-CORTEF) inj  100 mg Intravenous Q8H  . levofloxacin  500 mg Oral Q48H  . loratadine  10 mg Oral Daily  . mirabegron ER  50 mg Oral Daily  . neomycin-polymyxin b-dexamethasone  1 application Left Eye QID  . prednisoLONE acetate  1 drop Left Eye QID  . senna-docusate  1 tablet Oral BID  . [START ON 09/29/2019] sodium zirconium cyclosilicate  10 g Oral Daily   Continuous Infusions:    LOS: 10 days    Time spent:  25 mins.    Shawna Clamp, MD [facility]   If 7PM-7AM, please contact night-coverage

## 2019-09-28 NOTE — Progress Notes (Addendum)
Patient ID: Nicholas Ramirez, male   DOB: Jan 27, 1945, 74 y.o.   MRN: [id number] S: No new complaints O:BP 131/76 (BP Location: Right Arm)   Pulse 97   Temp 98.5 F (36.9 C) (Oral)   Resp 16   Ht 5\' 9"  (1.753 m)   Wt 101.6 kg   SpO2 94%   BMI 33.08 kg/m   Intake/Output Summary (Last 24 hours) at 09/28/2019 1120 Last data filed at 09/28/2019 0900 Gross per 24 hour  Intake 720 ml  Output 2150 ml  Net -1430 ml   Intake/Output: I/O last 3 completed shifts: In: 360 [P.O.:360] Out: 2600 [Urine:2600]  Intake/Output this shift:  Total I/O In: 480 [P.O.:480] Out: 400 [Urine:400] Weight change:  Gen: NAD CVS: no rub Resp:cta Abd:+BS, soft. nt Ext: 1+ edema  Recent Labs  Lab 09/22/19 0736 09/23/19 0455 09/24/19 0247 09/25/19 0618 09/26/19 1045 09/27/19 1050 09/28/19 0354  NA 128* 131* 133* 136 137 137 139  K 3.9 4.7 4.7 4.5 5.0 5.3* 6.0*  CL 100 100 100 106 107 106 107  CO2 19* 21* 20* 20* 19* 22 24  GLUCOSE 102* 135* 132* 128* 170* 150* 120*  BUN 53* 63* 75* 82* 89* 88* 86*  CREATININE 4.40* 5.05* 4.92* 4.28* 4.04* 3.51* 3.29*  ALBUMIN  --  1.7* 1.7* 1.5* 1.8* 1.6* 1.6*  CALCIUM 8.0* 8.2* 8.3* 8.3* 8.3* 8.1* 8.1*  PHOS 6.2* 7.1* 6.3* 5.6* 6.0* 5.4*  --   AST  --  136* 106* 99* 127* 109* 140*  ALT  --  13 17 25  45* 41 70*   Liver Function Tests: Recent Labs  Lab 09/26/19 1045 09/27/19 1050 09/28/19 0354  AST 127* 109* 140*  ALT 45* 41 70*  ALKPHOS 166* 130* 134*  BILITOT 5.6* 5.1* 5.3*  PROT 7.5 6.3* 6.1*  ALBUMIN 1.8* 1.6* 1.6*   No results for input(s): LIPASE, AMYLASE in the last 168 hours. No results for input(s): AMMONIA in the last 168 hours. CBC: Recent Labs  Lab 09/22/19 0736 09/22/19 0736 09/23/19 0455 09/23/19 0455 09/24/19 0247 09/25/19 0618 09/26/19 1045 09/27/19 1050 09/28/19 0821  WBC 7.1   < > 7.1  --  8.3  --   --  8.5  --   HGB 11.7*   < > 12.3*   < > 12.2*   < > 12.9* 12.0* 12.6*  HCT 33.4*   < > 33.8*   < > 34.2*   < > 37.2*  33.8* 36.1*  MCV 96.5  --  96.6  --  95.5  --   --  98.8  --   PLT 117*   < > 132*  --  113*  --   --  94*  --    < > = values in this interval not displayed.   Cardiac Enzymes: Recent Labs  Lab 09/23/19 0455  CKTOTAL 149   CBG: No results for input(s): GLUCAP in the last 168 hours.  Iron Studies: No results for input(s): IRON, TIBC, TRANSFERRIN, FERRITIN in the last 72 hours. Studies/Results: US Abdomen Limited RUQ  Result Date: 09/27/2019 CLINICAL DATA:  Elevated liver enzymes. EXAM: ULTRASOUND ABDOMEN LIMITED RIGHT UPPER QUADRANT COMPARISON:  Abdominal ultrasound 12/18/2014 FINDINGS: Gallbladder: Distended with edematous gallbladder wall thickening measuring up to 6 mm. No shadowing gallstone. No intraluminal sludge. No sonographic Murphy sign noted by sonographer. Common bile duct: Diameter: 7 mm, upper normal for age. Liver: There is intrahepatic biliary ductal dilatation. Hepatic parenchyma is heterogeneous, and mildly increased. There is  capsular nodularity. Liver lesions on prior ultrasound are not well seen on the current exam. Portal vein is patent on color Doppler imaging with normal direction of blood flow towards the liver. Other: Small volume right upper quadrant ascites and pericholecystic fluid. IMPRESSION: 1. Distended gallbladder with edematous gallbladder wall thickening. No gallstones or sludge. Findings may be related to underlying chronic liver disease with ascites in the right upper quadrant. If there is clinical concern for acute cholecystitis, recommend further evaluation with nuclear medicine HIDA scan. 2. Intrahepatic biliary ductal dilatation with upper normal common bile duct. 3. Heterogeneous hepatic parenchyma with increased echogenicity and suggestion of capsular nodularity. Findings suspicious for cirrhosis. Liver lesions on 2016 ultrasound are not well seen on the current exam. Electronically Signed   By: Keith Rake M.D.   On: 09/27/2019 21:42   .  Chlorhexidine Gluconate Cloth  6 each Topical Daily  . doxycycline  100 mg Oral Q12H  . DULoxetine  60 mg Oral Daily  . finasteride  5 mg Oral Daily  . gatifloxacin  1 drop Left Eye QID  . hydrocortisone sod succinate (SOLU-CORTEF) inj  100 mg Intravenous Q8H  . levofloxacin  500 mg Oral Q48H  . loratadine  10 mg Oral Daily  . mirabegron ER  50 mg Oral Daily  . neomycin-polymyxin b-dexamethasone  1 application Left Eye QID  . prednisoLONE acetate  1 drop Left Eye QID  . senna-docusate  1 tablet Oral BID    BMET    Component Value Date/Time   NA 139 09/28/2019 0354   K 6.0 (H) 09/28/2019 0354   CL 107 09/28/2019 0354   CO2 24 09/28/2019 0354   GLUCOSE 120 (H) 09/28/2019 0354   BUN 86 (H) 09/28/2019 0354   CREATININE 3.29 (H) 09/28/2019 0354   CALCIUM 8.1 (L) 09/28/2019 0354   GFRNONAA 18 (L) 09/28/2019 0354   GFRAA 20 (L) 09/28/2019 0354   CBC    Component Value Date/Time   WBC 8.5 09/27/2019 1050   RBC 3.42 (L) 09/27/2019 1050   HGB 12.6 (L) 09/28/2019 0821   HCT 36.1 (L) 09/28/2019 0821   PLT 94 (L) 09/27/2019 1050   MCV 98.8 09/27/2019 1050   MCH 35.1 (H) 09/27/2019 1050   MCHC 35.5 09/27/2019 1050   RDW 18.6 (H) 09/27/2019 1050   LYMPHSABS 1.8 09/18/2019 1811   MONOABS 0.9 09/18/2019 1811   EOSABS 0.2 09/18/2019 1811   BASOSABS 0.1 09/18/2019 1811     Assessment/Plan:  1. AKI, non-oliguric, multifactorial, in setting of hemodynamically mediated ischemic ATN (hypotension, hypovolemia, IV vanco(trough of 35 on 09/20/19),and urinary retention in setting of ACE inhibitor).  1. UOP hassignificantly increased afterfoley catheter placement due to BOO. 2. BUN/Scr slowly improving. 3. Continue to follow UOP and daily BUN/Cr 4. No uremic symptoms or indication for urgent dialysis. 5. Net negative 2 liters and may be having post-obstructive diuresis. If continues may need to start IVF's to prevent ATN. 2. 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 ophthamology performed surgical repair on 9/16. 3. HTN- on low side and taken off of lisinopril 9/18. 4. Hyponatremia- resolved 5. Hyperkalemia- bumped up to 6.  Will start lokelma and follow 6. Urinary retention-s/p Foley catheter 09/24/19. 7. Abnormal LFT's- increasedt. Bilirubin and elevated AST. Continue to follow LFT's 1. RUQ [place] with distended GB/edematous.  ?need for HIDA.  elevated LFT's and t bili persist.  8. Constipation- per pt no BM since last Wednesday. Consider laxatives.  Per primary.  Donetta Potts, MD [employer] [phone]

## 2019-09-28 NOTE — Progress Notes (Signed)
Inpatient Rehab Admissions Coordinator:  Marjory Lies from [facility] contacted Karene Fry regarding pt's expedited appeal.  Pt is now approved for admission to CIR.  Daughter, Deanna notified.  Will plan for admission to CIR on 09/29/19 as long as pt remains medically stable.  Dr. Dwyane Dee in agreement.   Gayland Curry, [place], [place] Admissions Coordinator [phone]

## 2019-09-29 DIAGNOSIS — R9431 Abnormal electrocardiogram [ECG] [EKG]: Secondary | ICD-10-CM

## 2019-09-29 DIAGNOSIS — E871 Hypo-osmolality and hyponatremia: Secondary | ICD-10-CM

## 2019-09-29 DIAGNOSIS — S0532XD Ocular laceration without prolapse or loss of intraocular tissue, left eye, subsequent encounter: Secondary | ICD-10-CM

## 2019-09-29 LAB — COMPREHENSIVE METABOLIC PANEL
ALT: 102 U/L — ABNORMAL HIGH (ref 0–44)
AST: 112 U/L — ABNORMAL HIGH (ref 15–41)
Albumin: 1.8 g/dL — ABNORMAL LOW (ref 3.5–5.0)
Alkaline Phosphatase: 151 U/L — ABNORMAL HIGH (ref 38–126)
Anion gap: 9 (ref 5–15)
BUN: 78 mg/dL — ABNORMAL HIGH (ref 8–23)
CO2: 24 mmol/L (ref 22–32)
Calcium: 8.4 mg/dL — ABNORMAL LOW (ref 8.9–10.3)
Chloride: 110 mmol/L (ref 98–111)
Creatinine, Ser: 2.95 mg/dL — ABNORMAL HIGH (ref 0.61–1.24)
GFR calc Af Amer: 23 mL/min — ABNORMAL LOW (ref >60)
GFR calc non Af Amer: 20 mL/min — ABNORMAL LOW (ref >60)
Glucose, Bld: 128 mg/dL — ABNORMAL HIGH (ref 70–99)
Potassium: 5.1 mmol/L (ref 3.5–5.1)
Sodium: 143 mmol/L (ref 135–145)
Total Bilirubin: 5.2 mg/dL — ABNORMAL HIGH (ref 0.3–1.2)
Total Protein: 7.4 g/dL (ref 6.5–8.1)

## 2019-09-29 LAB — CBC
HCT: 39.8 % (ref 39.0–52.0)
Hemoglobin: 13.5 g/dL (ref 13.0–17.0)
MCH: 34.3 pg — ABNORMAL HIGH (ref 26.0–34.0)
MCHC: 33.9 g/dL (ref 30.0–36.0)
MCV: 101 fL — ABNORMAL HIGH (ref 80.0–100.0)
Platelets: 96 10*3/uL — ABNORMAL LOW (ref 150–400)
RBC: 3.94 MIL/uL — ABNORMAL LOW (ref 4.22–5.81)
RDW: 18.6 % — ABNORMAL HIGH (ref 11.5–15.5)
WBC: 11.2 10*3/uL — ABNORMAL HIGH (ref 4.0–10.5)
nRBC: 0 % (ref 0.0–0.2)

## 2019-09-29 LAB — MAGNESIUM: Magnesium: 1.9 mg/dL (ref 1.7–2.4)

## 2019-09-29 LAB — PHOSPHORUS: Phosphorus: 5.3 mg/dL — ABNORMAL HIGH (ref 2.5–4.6)

## 2019-09-29 MED ORDER — NYSTATIN 100000 UNIT/ML MT SUSP
5.0000 mL | Freq: Four times a day (QID) | OROMUCOSAL | Status: DC
  Administered 2019-09-29 – 2019-09-30 (×5): 500000 [IU] via ORAL
  Filled 2019-09-29 (×5): qty 5

## 2019-09-29 MED ORDER — POLYETHYLENE GLYCOL 3350 17 G PO PACK
17.0000 g | PACK | Freq: Two times a day (BID) | ORAL | Status: DC
  Administered 2019-09-29 – 2019-09-30 (×3): 17 g via ORAL
  Filled 2019-09-29 (×3): qty 1

## 2019-09-29 NOTE — Progress Notes (Signed)
Patient ID: Nicholas Ramirez, male   DOB: 1945/02/28, 74 y.o.   MRN: [id number] S: No complaints O:BP (!) 156/107 (BP Location: Right Arm)   Pulse (!) 107   Temp 98 F (36.7 C) (Oral)   Resp 16   Ht 5\' 9"  (1.753 m)   Wt 112.5 kg   SpO2 94%   BMI 36.63 kg/m   Intake/Output Summary (Last 24 hours) at 09/29/2019 1149 Last data filed at 09/29/2019 1020 Gross per 24 hour  Intake 840 ml  Output 2375 ml  Net -1535 ml   Intake/Output: I/O last 3 completed shifts: In: 1560 [P.O.:1560] Out: 3075 [Urine:3075]  Intake/Output this shift:  Total I/O In: 0  Out: 350 [Urine:350] Weight change:  Gen: NAD CVS: tachy Resp: cta Abd: +BS, soft Ext: 1+ edema of ble  Recent Labs  Lab 09/23/19 0455 09/24/19 0247 09/25/19 0618 09/26/19 1045 09/27/19 1050 09/28/19 0354 09/29/19 1001  NA 131* 133* 136 137 137 139 143  K 4.7 4.7 4.5 5.0 5.3* 6.0* 5.1  CL 100 100 106 107 106 107 110  CO2 21* 20* 20* 19* 22 24 24   GLUCOSE 135* 132* 128* 170* 150* 120* 128*  BUN 63* 75* 82* 89* 88* 86* 78*  CREATININE 5.05* 4.92* 4.28* 4.04* 3.51* 3.29* 2.95*  ALBUMIN 1.7* 1.7* 1.5* 1.8* 1.6* 1.6* 1.8*  CALCIUM 8.2* 8.3* 8.3* 8.3* 8.1* 8.1* 8.4*  PHOS 7.1* 6.3* 5.6* 6.0* 5.4*  --  5.3*  AST 136* 106* 99* 127* 109* 140* 112*  ALT 13 17 25  45* 41 70* 102*   Liver Function Tests: Recent Labs  Lab 09/27/19 1050 09/28/19 0354 09/29/19 1001  AST 109* 140* 112*  ALT 41 70* 102*  ALKPHOS 130* 134* 151*  BILITOT 5.1* 5.3* 5.2*  PROT 6.3* 6.1* 7.4  ALBUMIN 1.6* 1.6* 1.8*   No results for input(s): LIPASE, AMYLASE in the last 168 hours. No results for input(s): AMMONIA in the last 168 hours. CBC: Recent Labs  Lab 09/23/19 0455 09/23/19 0455 09/24/19 0247 09/25/19 0618 09/27/19 1050 09/28/19 0821 09/29/19 1001  WBC 7.1   < > 8.3  --  8.5  --  11.2*  HGB 12.3*   < > 12.2*   < > 12.0* 12.6* 13.5  HCT 33.8*   < > 34.2*   < > 33.8* 36.1* 39.8  MCV 96.6  --  95.5  --  98.8  --  101.0*  PLT 132*   <  > 113*  --  94*  --  96*   < > = values in this interval not displayed.   Cardiac Enzymes: Recent Labs  Lab 09/23/19 0455  CKTOTAL 149   CBG: No results for input(s): GLUCAP in the last 168 hours.  Iron Studies: No results for input(s): IRON, TIBC, TRANSFERRIN, FERRITIN in the last 72 hours. Studies/Results: US Abdomen Limited RUQ  Result Date: 09/27/2019 CLINICAL DATA:  Elevated liver enzymes. EXAM: ULTRASOUND ABDOMEN LIMITED RIGHT UPPER QUADRANT COMPARISON:  Abdominal ultrasound 12/18/2014 FINDINGS: Gallbladder: Distended with edematous gallbladder wall thickening measuring up to 6 mm. No shadowing gallstone. No intraluminal sludge. No sonographic Murphy sign noted by sonographer. Common bile duct: Diameter: 7 mm, upper normal for age. Liver: There is intrahepatic biliary ductal dilatation. Hepatic parenchyma is heterogeneous, and mildly increased. There is capsular nodularity. Liver lesions on prior ultrasound are not well seen on the current exam. Portal vein is patent on color Doppler imaging with normal direction of blood flow towards the liver. Other: Small volume  right upper quadrant ascites and pericholecystic fluid. IMPRESSION: 1. Distended gallbladder with edematous gallbladder wall thickening. No gallstones or sludge. Findings may be related to underlying chronic liver disease with ascites in the right upper quadrant. If there is clinical concern for acute cholecystitis, recommend further evaluation with nuclear medicine HIDA scan. 2. Intrahepatic biliary ductal dilatation with upper normal common bile duct. 3. Heterogeneous hepatic parenchyma with increased echogenicity and suggestion of capsular nodularity. Findings suspicious for cirrhosis. Liver lesions on 2016 ultrasound are not well seen on the current exam. Electronically Signed   By: Keith Rake M.D.   On: 09/27/2019 21:42   . Chlorhexidine Gluconate Cloth  6 each Topical Daily  . doxycycline  100 mg Oral Q12H  .  DULoxetine  60 mg Oral Daily  . finasteride  5 mg Oral Daily  . gatifloxacin  1 drop Left Eye QID  . hydrocortisone sod succinate (SOLU-CORTEF) inj  100 mg Intravenous Q8H  . levofloxacin  500 mg Oral Q48H  . loratadine  10 mg Oral Daily  . mirabegron ER  50 mg Oral Daily  . neomycin-polymyxin b-dexamethasone  1 application Left Eye QID  . polyethylene glycol  17 g Oral BID  . prednisoLONE acetate  1 drop Left Eye QID  . senna-docusate  1 tablet Oral BID  . sodium zirconium cyclosilicate  10 g Oral Daily    BMET    Component Value Date/Time   NA 143 09/29/2019 1001   K 5.1 09/29/2019 1001   CL 110 09/29/2019 1001   CO2 24 09/29/2019 1001   GLUCOSE 128 (H) 09/29/2019 1001   BUN 78 (H) 09/29/2019 1001   CREATININE 2.95 (H) 09/29/2019 1001   CALCIUM 8.4 (L) 09/29/2019 1001   GFRNONAA 20 (L) 09/29/2019 1001   GFRAA 23 (L) 09/29/2019 1001   CBC    Component Value Date/Time   WBC 11.2 (H) 09/29/2019 1001   RBC 3.94 (L) 09/29/2019 1001   HGB 13.5 09/29/2019 1001   HCT 39.8 09/29/2019 1001   PLT 96 (L) 09/29/2019 1001   MCV 101.0 (H) 09/29/2019 1001   MCH 34.3 (H) 09/29/2019 1001   MCHC 33.9 09/29/2019 1001   RDW 18.6 (H) 09/29/2019 1001   LYMPHSABS 1.8 09/18/2019 1811   MONOABS 0.9 09/18/2019 1811   EOSABS 0.2 09/18/2019 1811   BASOSABS 0.1 09/18/2019 1811     Assessment/Plan:  1. AKI, non-oliguric, multifactorial, in setting of hemodynamically mediated ischemic ATN (hypotension, hypovolemia, IV vanco(trough of 35 on 09/20/19),and urinary retention in setting of ACE inhibitor).  1. UOP hassignificantly increased afterfoley catheter placement due to BOO. 2. BUN/Scr continues to slowly improve. 3. Continue to follow UOP and daily BUN/Cr 4. No uremic symptoms or indication for urgent dialysis. 2. 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 ophthamology performed surgical repair on 9/16. 3. HTN- on low side and taken off of  lisinopril 9/18. 4. Hyponatremia- resolved 5. Hyperkalemia- bumped up to 6.  Will start lokelma and follow 6. Urinary retention-s/p Foley catheter 09/24/19. 7. Abnormal LFT's- increasedt. Bilirubin and elevated AST. Continue to follow LFT's 1. RUQ [place] with distended GB/edematous.  ?need for HIDA.  elevated LFT's and t bili persist. 8. Constipation- per pt no BM since last Wednesday. Consider laxatives. Per primary. Donetta Potts, MD [employer] [phone]

## 2019-09-29 NOTE — Progress Notes (Signed)
Patient complained of his tongue hurting and noted a white/ yellow coat on his tongue.  MD paged, orders for nystatin given

## 2019-09-29 NOTE — Progress Notes (Signed)
PROGRESS NOTE    Nicholas Ramirez  UYQ:[id number] DOB: 1945/10/18 DOA: 09/18/2019 PCP: Townsend Roger, MD   Brief Narrative:  74 year old male withHTN, heart murmur, cataracts, hepatitis C presented to [place] a fall at home. Patient had multiple falls over the last few months at home. He has been evaluated at outpatient neurology (Dr Tomi Likens) and has had CT scan and MRI of his brain performed. He reported he has had generalized weakness with a slow unsteady gait. He was recently started on levodopa, carbidopa for possible Parkinson's disease by neurology.He fell on the dresser and has sustained a laceration to the side of his eye and hit his eye. He was unable to see and EMS was called. He reports that he had normal vision with his glasses in both eyes until this fall. He reports he cannot see out of the left eye after his fall. He was found to have hyphema and possible hematoma in left eye on CT scan.He denies any loss of consciousness with the fall. CT scan of his orbits shows probable rupture of anterior left globe. Ophthalmology has been consulted and  He underwent surgical repair 9/16. ophthalmology recommended broad spectrum antibiotics. He has developed AKI , Nephrology consulted. Vancomycin discontinued, He has developed urinary retention, requiring foley catheter. Renal functions are slowly improving. Continued on supportive care, No need for hemodialysis at this time.  PT : inpatient rehab   Assessment & Plan:   Principal Problem:   Ruptured globe of left eye Active Problems:   Essential hypertension   Fall at home, initial encounter   Hyponatremia   Prolonged QT interval   Ruptured globe of left eye, open globe/rupture with uveal prolapse/loss of uvular tissue, complete hyphema -Secondary to mechanical fall causing trauma to the left eye.  Status post surgical repair 9/16.  Eye shield at all times.  Continue antibiotics at this time, upon discharge will get levofloxacin  and doxycycline.  Essential hypertension -Currently on hold.  Acute kidney injury with hyperkalemia -Suspect from ATN.  Closely monitor creatinine levels at this time.  Allow medication washout-vancomycin. Milly Jakob for hyperkalemia -Supportive care.  Mechanical fall, recurrent falls at home, recent diagnosis of Parkinson's -PT/OT recommending CIR.  Per outpatient neurology recommendations Dr. Tomi Likens, now on Sinemet.  Continue this for now. 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  Hyponatremia >>> Resolved -Resolved  Prolonged QT interval -Avoid QT prolonging meds.  Continue to monitor  Constipation : Improving -Continue bowel regimen  Abnormal LFTS: US  showed  Edematous gallbladder wall thickening, no stones, sludge. -HIDA scan pending  Obesity Estimated body mass index is 38.19 kg/m as calculated from the following: Height as of this encounter: 5\' 9"  (1.753 m). Weight as of this encounter: 117.3 kg.  Coughing/dysphagia -Speech and swallow ordered   DVT prophylaxis: SCDs Start: 09/19/19 0138  Code Status: Full code Family Communication: None  Status is: Inpatient  Remains inpatient appropriate because:Inpatient level of care appropriate due to severity of illness   Dispo: The patient is from: Home              Anticipated d/c is to: CIR              Anticipated d/c date is: 2 days              Patient currently is not medically stable to d/c.  Maintain hospital stay until completion of  his HIDA scan clearance by nephrology.    Body mass index is 36.63 kg/m.     Subjective: Initially notified by the nurse that patient was more confused but when I arrived at bedside patient was answering most of the questions appropriately, confused about time only.  No focal neuro deficits.  He was following all the  commands.  Review of Systems Otherwise negative except as per HPI, including: General: Denies fever, chills, night sweats or unintended weight loss. Resp: Denies cough, wheezing, shortness of breath. Cardiac: Denies chest pain, palpitations, orthopnea, paroxysmal nocturnal dyspnea. GI: Denies abdominal pain, nausea, vomiting, diarrhea or constipation GU: Denies dysuria, frequency, hesitancy or incontinence MS: Denies muscle aches, joint pain or swelling Neuro: Denies headache, neurologic deficits (focal weakness, numbness, tingling), abnormal gait Psych: Denies anxiety, depression, SI/HI/AVH Skin: Denies new rashes or lesions ID: Denies sick contacts, exotic exposures, travel  Examination:  Constitutional: Not in acute distress, eye shield in place Respiratory some bibasilar rhonchi Cardiovascular: Normal sinus rhythm, no rubs Abdomen: Nontender nondistended good bowel sounds Musculoskeletal: 2+ bilateral lower extremity pitting edema Skin: No rashes seen Neurologic: CN 2-12 grossly intact.  And nonfocal Psychiatric: Poor judgment and insight. Alert and oriented x 2 (name and place). Normal mood.  Objective: Vitals:   09/28/19 2039 09/29/19 0440 09/29/19 0544 09/29/19 0949  BP: (!) 148/91  129/86 (!) 156/107  Pulse: 96  (!) 103 (!) 107  Resp: 17  16   Temp: 98.4 F (36.9 C)  98 F (36.7 C)   TempSrc: Oral  Oral   SpO2: 93%  94% 94%  Weight: 112.5 kg 112.5 kg    Height:        Intake/Output Summary (Last 24 hours) at 09/29/2019 1408 Last data filed at 09/29/2019 1020 Gross per 24 hour  Intake 360 ml  Output 2075 ml  Net -1715 ml   Filed Weights   09/26/19 2108 09/28/19 2039 09/29/19 0440  Weight: 101.6 kg 112.5 kg 112.5 kg     Data Reviewed:   CBC: Recent Labs  Lab 09/23/19 0455 09/23/19 0455 09/24/19 0247 09/24/19 0247 09/25/19 0618 09/26/19 1045 09/27/19 1050 09/28/19 0821 09/29/19 1001  WBC 7.1  --  8.3  --   --   --  8.5  --  11.2*  HGB 12.3*   <  > 12.2*   < > 11.9* 12.9* 12.0* 12.6* 13.5  HCT 33.8*   < > 34.2*   < > 32.9* 37.2* 33.8* 36.1* 39.8  MCV 96.6  --  95.5  --   --   --  98.8  --  101.0*  PLT 132*  --  113*  --   --   --  94*  --  96*   < > = values in this interval not displayed.   Basic Metabolic Panel: Recent Labs  Lab 09/24/19 0247 09/24/19 0247 09/25/19 0618 09/26/19 1045 09/27/19 1050 09/28/19 0354 09/29/19 1001  NA 133*   < > 136 137 137 139 143  K 4.7   < > 4.5 5.0 5.3* 6.0* 5.1  CL 100   < > 106 107 106 107 110  CO2 20*   < > 20* 19* 22 24 24   GLUCOSE 132*   < > 128* 170* 150* 120* 128*  BUN 75*   < > 82* 89* 88* 86* 78*  CREATININE 4.92*   < > 4.28* 4.04* 3.51* 3.29* 2.95*  CALCIUM 8.3*   < > 8.3* 8.3* 8.1* 8.1* 8.4*  MG  2.4  --  2.3 2.2 2.1  --  1.9  PHOS 6.3*  --  5.6* 6.0* 5.4*  --  5.3*   < > = values in this interval not displayed.   GFR: Estimated Creatinine Clearance: 27.6 mL/min (A) (by C-G formula based on SCr of 2.95 mg/dL (H)). Liver Function Tests: Recent Labs  Lab 09/25/19 0618 09/26/19 1045 09/27/19 1050 09/28/19 0354 09/29/19 1001  AST 99* 127* 109* 140* 112*  ALT 25 45* 41 70* 102*  ALKPHOS 155* 166* 130* 134* 151*  BILITOT 5.1* 5.6* 5.1* 5.3* 5.2*  PROT 6.5 7.5 6.3* 6.1* 7.4  ALBUMIN 1.5* 1.8* 1.6* 1.6* 1.8*   No results for input(s): LIPASE, AMYLASE in the last 168 hours. No results for input(s): AMMONIA in the last 168 hours. Coagulation Profile: No results for input(s): INR, PROTIME in the last 168 hours. Cardiac Enzymes: Recent Labs  Lab 09/23/19 0455  CKTOTAL 149   BNP (last 3 results) No results for input(s): PROBNP in the last 8760 hours. HbA1C: No results for input(s): HGBA1C in the last 72 hours. CBG: No results for input(s): GLUCAP in the last 168 hours. Lipid Profile: No results for input(s): CHOL, HDL, LDLCALC, TRIG, CHOLHDL, LDLDIRECT in the last 72 hours. Thyroid Function Tests: No results for input(s): TSH, T4TOTAL, FREET4, T3FREE, THYROIDAB in  the last 72 hours. Anemia Panel: No results for input(s): VITAMINB12, FOLATE, FERRITIN, TIBC, IRON, RETICCTPCT in the last 72 hours. Sepsis Labs: No results for input(s): PROCALCITON, LATICACIDVEN in the last 168 hours.  No results found for this or any previous visit (from the past 240 hour(s)).       Radiology Studies: US Abdomen Limited RUQ  Result Date: 09/27/2019 CLINICAL DATA:  Elevated liver enzymes. EXAM: ULTRASOUND ABDOMEN LIMITED RIGHT UPPER QUADRANT COMPARISON:  Abdominal ultrasound 12/18/2014 FINDINGS: Gallbladder: Distended with edematous gallbladder wall thickening measuring up to 6 mm. No shadowing gallstone. No intraluminal sludge. No sonographic Murphy sign noted by sonographer. Common bile duct: Diameter: 7 mm, upper normal for age. Liver: There is intrahepatic biliary ductal dilatation. Hepatic parenchyma is heterogeneous, and mildly increased. There is capsular nodularity. Liver lesions on prior ultrasound are not well seen on the current exam. Portal vein is patent on color Doppler imaging with normal direction of blood flow towards the liver. Other: Small volume right upper quadrant ascites and pericholecystic fluid. IMPRESSION: 1. Distended gallbladder with edematous gallbladder wall thickening. No gallstones or sludge. Findings may be related to underlying chronic liver disease with ascites in the right upper quadrant. If there is clinical concern for acute cholecystitis, recommend further evaluation with nuclear medicine HIDA scan. 2. Intrahepatic biliary ductal dilatation with upper normal common bile duct. 3. Heterogeneous hepatic parenchyma with increased echogenicity and suggestion of capsular nodularity. Findings suspicious for cirrhosis. Liver lesions on 2016 ultrasound are not well seen on the current exam. Electronically Signed   By: Keith Rake M.D.   On: 09/27/2019 21:42        Scheduled Meds: . Chlorhexidine Gluconate Cloth  6 each Topical Daily  .  doxycycline  100 mg Oral Q12H  . DULoxetine  60 mg Oral Daily  . finasteride  5 mg Oral Daily  . gatifloxacin  1 drop Left Eye QID  . hydrocortisone sod succinate (SOLU-CORTEF) inj  100 mg Intravenous Q8H  . levofloxacin  500 mg Oral Q48H  . loratadine  10 mg Oral Daily  . mirabegron ER  50 mg Oral Daily  . neomycin-polymyxin b-dexamethasone  1  application Left Eye QID  . polyethylene glycol  17 g Oral BID  . prednisoLONE acetate  1 drop Left Eye QID  . senna-docusate  1 tablet Oral BID  . sodium zirconium cyclosilicate  10 g Oral Daily   Continuous Infusions:   LOS: 11 days   Time spent= 35 mins    Zamyah Wiesman Arsenio Loader, MD [facility]  If 7PM-7AM, please contact night-coverage  09/29/2019, 2:08 PM

## 2019-09-29 NOTE — Evaluation (Addendum)
Clinical/Bedside Swallow Evaluation Patient Details  Name: Nicholas Ramirez MRN: [id number] Date of Birth: 07/31/1945  Today's Date: 09/29/2019 Time: SLP Start Time (ACUTE ONLY): 1145 SLP Stop Time (ACUTE ONLY): 1159 SLP Time Calculation (min) (ACUTE ONLY): 14 min  Past Medical History:  Past Medical History:  Diagnosis Date  . Asthma   . Depression   . Heart murmur   . Hepatitis C   . Hypertension    Past Surgical History:  Past Surgical History:  Procedure Laterality Date  . CARPAL TUNNEL RELEASE Left   . RUPTURED GLOBE EXPLORATION AND REPAIR Left 09/18/2019   Procedure: REPAIR OF RUPTURED GLOBE;  Surgeon: Lonia Skinner, MD;  Location: [place];  Service: Ophthalmology;  Laterality: Left;   HPI:  The pt is a 74 yo male presenting after a fall at home that resulted in injury to his L eye (rupture of anterior left globe). PMH includes: HTN, heart murmur, cataracts, hepatitis C, and ongoing workup for Parkinson's Disease with outpatient neurology.   Assessment / Plan / Recommendation Clinical Impression  Pt was seen for a bedside swallow evaluation secondary to difficulty with medication consumption earlier this morning.  Pt was encountered lethargic in bed, but he was very pleasant and cooperative throughout this evaluation.  He stated that he has some difficulty consuming regular solids at home and that he occasionally coughs when consuming tough meats, breads, etc.  Oral mechanism examination was remarkable for reduced lingual ROM and yellow/cream coating on the pt's lingual surface, concerning for thrush. RN was made aware.  Pt consumed trials of thin liquid via straw, puree, and regular solids.  He exhibited prolonged mastication of regular solids with an immediate cough noted following swallow initiation.  Cough was observed to be strong.  Mild oral residue was also observed following the regular solid trial.  Pt was sensate to residue and was able to effectively clear it with a liquid  wash.  No overt s/sx of aspiration were observed with trials of thin liquid (>3oz continuously) or with puree.  Recommend diet change to Dysphagia 2 (fine chop) solids and thin liquids with medication administered crushed in puree.  Pt may also benefit from a cognitive-linguistic evaluation secondary to suspected cognitive deficits and reduced vocal volume if it aligns with MD plan of care.  SLP will f/u to monitor diet tolerance.    SLP Visit Diagnosis: Dysphagia, unspecified (R13.10)    Aspiration Risk  Mild aspiration risk    Diet Recommendation Dysphagia 2 (Fine chop);Thin liquid   Liquid Administration via: Cup;Straw Medication Administration: Crushed with puree Supervision: Staff to assist with self feeding;Intermittent supervision to cue for compensatory strategies Compensations: Slow rate;Small sips/bites Postural Changes: Seated upright at 90 degrees    Other  Recommendations Oral Care Recommendations: Oral care BID   Follow up Recommendations Other (comment) (Defer to PT/OT recommendations )      Frequency and Duration min 2x/week  2 weeks       Prognosis Prognosis for Safe Diet Advancement: Good Barriers to Reach Goals:  (Progressive disease )      Swallow Study   General HPI: The pt is a 74 yo male presenting after a fall at home that resulted in injury to his L eye (rupture of anterior left globe). PMH includes: HTN, heart murmur, cataracts, hepatitis C, and ongoing workup for Parkinson's Disease with outpatient neurology. Type of Study: Bedside Swallow Evaluation Previous Swallow Assessment: None  Diet Prior to this Study: Regular;Thin liquids Temperature Spikes Noted: No Respiratory  Status: Room air History of Recent Intubation: No Behavior/Cognition: Cooperative;Pleasant mood;Lethargic/Drowsy Oral Cavity Assessment: Dry Oral Care Completed by SLP: No Oral Cavity - Dentition: Missing dentition Self-Feeding Abilities: Needs assist Patient Positioning: Upright  in bed Baseline Vocal Quality: Low vocal intensity Volitional Cough: Strong Volitional Swallow: Able to elicit    Oral/Motor/Sensory Function Overall Oral Motor/Sensory Function: Mild impairment Facial ROM: Within Functional Limits Facial Symmetry: Within Functional Limits Facial Sensation: Within Functional Limits Lingual ROM: Reduced right;Reduced left Lingual Symmetry: Within Functional Limits   Ice Chips Ice chips: Not tested   Thin Liquid Thin Liquid: Within functional limits Presentation: Straw    Nectar Thick Nectar Thick Liquid: Not tested   Honey Thick Honey Thick Liquid: Not tested   Puree Puree: Within functional limits Presentation: Spoon   Solid     Solid: Impaired Presentation: Spoon Oral Phase Impairments: Impaired mastication Oral Phase Functional Implications: Prolonged oral transit;Impaired mastication;Oral residue Pharyngeal Phase Impairments: Cough - Immediate     Nicholas Ramirez., M.S., CCC-SLP Acute Rehabilitation Services Office: [phone]  Nicholas Ramirez 09/29/2019,12:07 PM

## 2019-09-30 ENCOUNTER — Encounter (HOSPITAL_COMMUNITY): Payer: Self-pay | Admitting: Family Medicine

## 2019-09-30 ENCOUNTER — Inpatient Hospital Stay (HOSPITAL_COMMUNITY)
Admission: RE | Admit: 2019-09-30 | Discharge: 2019-10-07 | DRG: 945 | Disposition: A | Discharge status: Other Acute Inpatient Hospital | Payer: Medicare Other | Source: Intra-hospital | Attending: Physical Medicine & Rehabilitation | Admitting: Physical Medicine & Rehabilitation

## 2019-09-30 ENCOUNTER — Inpatient Hospital Stay (HOSPITAL_COMMUNITY): Payer: Medicare Other

## 2019-09-30 DIAGNOSIS — H2102 Hyphema, left eye: Secondary | ICD-10-CM | POA: Diagnosis present

## 2019-09-30 DIAGNOSIS — D696 Thrombocytopenia, unspecified: Secondary | ICD-10-CM | POA: Diagnosis not present

## 2019-09-30 DIAGNOSIS — E870 Hyperosmolality and hypernatremia: Secondary | ICD-10-CM | POA: Diagnosis not present

## 2019-09-30 DIAGNOSIS — Z87891 Personal history of nicotine dependence: Secondary | ICD-10-CM

## 2019-09-30 DIAGNOSIS — Z66 Do not resuscitate: Secondary | ICD-10-CM | POA: Diagnosis not present

## 2019-09-30 DIAGNOSIS — R1310 Dysphagia, unspecified: Secondary | ICD-10-CM | POA: Diagnosis not present

## 2019-09-30 DIAGNOSIS — Z6841 Body Mass Index (BMI) 40.0 and over, adult: Secondary | ICD-10-CM | POA: Diagnosis not present

## 2019-09-30 DIAGNOSIS — Z79899 Other long term (current) drug therapy: Secondary | ICD-10-CM | POA: Diagnosis not present

## 2019-09-30 DIAGNOSIS — E8809 Other disorders of plasma-protein metabolism, not elsewhere classified: Secondary | ICD-10-CM | POA: Diagnosis not present

## 2019-09-30 DIAGNOSIS — R933 Abnormal findings on diagnostic imaging of other parts of digestive tract: Secondary | ICD-10-CM | POA: Diagnosis not present

## 2019-09-30 DIAGNOSIS — D72829 Elevated white blood cell count, unspecified: Secondary | ICD-10-CM | POA: Diagnosis present

## 2019-09-30 DIAGNOSIS — N179 Acute kidney failure, unspecified: Secondary | ICD-10-CM | POA: Diagnosis not present

## 2019-09-30 DIAGNOSIS — E876 Hypokalemia: Secondary | ICD-10-CM | POA: Diagnosis not present

## 2019-09-30 DIAGNOSIS — B182 Chronic viral hepatitis C: Secondary | ICD-10-CM | POA: Diagnosis present

## 2019-09-30 DIAGNOSIS — K767 Hepatorenal syndrome: Secondary | ICD-10-CM | POA: Diagnosis not present

## 2019-09-30 DIAGNOSIS — I959 Hypotension, unspecified: Secondary | ICD-10-CM | POA: Diagnosis not present

## 2019-09-30 DIAGNOSIS — R140 Abdominal distension (gaseous): Secondary | ICD-10-CM | POA: Diagnosis not present

## 2019-09-30 DIAGNOSIS — K831 Obstruction of bile duct: Secondary | ICD-10-CM | POA: Diagnosis not present

## 2019-09-30 DIAGNOSIS — R296 Repeated falls: Secondary | ICD-10-CM | POA: Diagnosis present

## 2019-09-30 DIAGNOSIS — K828 Other specified diseases of gallbladder: Secondary | ICD-10-CM | POA: Diagnosis not present

## 2019-09-30 DIAGNOSIS — R7401 Elevation of levels of liver transaminase levels: Secondary | ICD-10-CM

## 2019-09-30 DIAGNOSIS — E875 Hyperkalemia: Secondary | ICD-10-CM | POA: Diagnosis not present

## 2019-09-30 DIAGNOSIS — E46 Unspecified protein-calorie malnutrition: Secondary | ICD-10-CM

## 2019-09-30 DIAGNOSIS — S0522XA Ocular laceration and rupture with prolapse or loss of intraocular tissue, left eye, initial encounter: Secondary | ICD-10-CM | POA: Diagnosis not present

## 2019-09-30 DIAGNOSIS — R188 Other ascites: Secondary | ICD-10-CM | POA: Diagnosis not present

## 2019-09-30 DIAGNOSIS — C221 Intrahepatic bile duct carcinoma: Secondary | ICD-10-CM | POA: Diagnosis not present

## 2019-09-30 DIAGNOSIS — I10 Essential (primary) hypertension: Secondary | ICD-10-CM | POA: Diagnosis not present

## 2019-09-30 DIAGNOSIS — R5381 Other malaise: Secondary | ICD-10-CM | POA: Diagnosis not present

## 2019-09-30 DIAGNOSIS — R9431 Abnormal electrocardiogram [ECG] [EKG]: Secondary | ICD-10-CM

## 2019-09-30 DIAGNOSIS — R1084 Generalized abdominal pain: Secondary | ICD-10-CM | POA: Diagnosis not present

## 2019-09-30 DIAGNOSIS — K838 Other specified diseases of biliary tract: Secondary | ICD-10-CM | POA: Diagnosis not present

## 2019-09-30 DIAGNOSIS — D649 Anemia, unspecified: Secondary | ICD-10-CM | POA: Diagnosis not present

## 2019-09-30 DIAGNOSIS — R4182 Altered mental status, unspecified: Secondary | ICD-10-CM | POA: Diagnosis not present

## 2019-09-30 DIAGNOSIS — G20 Parkinson's disease: Secondary | ICD-10-CM | POA: Diagnosis not present

## 2019-09-30 DIAGNOSIS — S0532XD Ocular laceration without prolapse or loss of intraocular tissue, left eye, subsequent encounter: Secondary | ICD-10-CM | POA: Diagnosis not present

## 2019-09-30 DIAGNOSIS — G20A1 Parkinson's disease without dyskinesia, without mention of fluctuations: Secondary | ICD-10-CM | POA: Diagnosis present

## 2019-09-30 DIAGNOSIS — K7460 Unspecified cirrhosis of liver: Secondary | ICD-10-CM | POA: Diagnosis present

## 2019-09-30 DIAGNOSIS — J45909 Unspecified asthma, uncomplicated: Secondary | ICD-10-CM | POA: Diagnosis not present

## 2019-09-30 DIAGNOSIS — K7290 Hepatic failure, unspecified without coma: Secondary | ICD-10-CM | POA: Diagnosis not present

## 2019-09-30 DIAGNOSIS — S0532XA Ocular laceration without prolapse or loss of intraocular tissue, left eye, initial encounter: Secondary | ICD-10-CM | POA: Diagnosis present

## 2019-09-30 DIAGNOSIS — K5900 Constipation, unspecified: Secondary | ICD-10-CM | POA: Diagnosis present

## 2019-09-30 DIAGNOSIS — E871 Hypo-osmolality and hyponatremia: Secondary | ICD-10-CM | POA: Diagnosis not present

## 2019-09-30 DIAGNOSIS — E873 Alkalosis: Secondary | ICD-10-CM | POA: Diagnosis not present

## 2019-09-30 DIAGNOSIS — R4020 Unspecified coma: Secondary | ICD-10-CM | POA: Diagnosis not present

## 2019-09-30 DIAGNOSIS — C240 Malignant neoplasm of extrahepatic bile duct: Secondary | ICD-10-CM | POA: Diagnosis not present

## 2019-09-30 DIAGNOSIS — C801 Malignant (primary) neoplasm, unspecified: Secondary | ICD-10-CM

## 2019-09-30 LAB — COMPREHENSIVE METABOLIC PANEL
ALT: 96 U/L — ABNORMAL HIGH (ref 0–44)
AST: 109 U/L — ABNORMAL HIGH (ref 15–41)
Albumin: 1.8 g/dL — ABNORMAL LOW (ref 3.5–5.0)
Alkaline Phosphatase: 140 U/L — ABNORMAL HIGH (ref 38–126)
Anion gap: 9 (ref 5–15)
BUN: 82 mg/dL — ABNORMAL HIGH (ref 8–23)
CO2: 24 mmol/L (ref 22–32)
Calcium: 8.4 mg/dL — ABNORMAL LOW (ref 8.9–10.3)
Chloride: 108 mmol/L (ref 98–111)
Creatinine, Ser: 2.91 mg/dL — ABNORMAL HIGH (ref 0.61–1.24)
GFR calc Af Amer: 24 mL/min — ABNORMAL LOW (ref >60)
GFR calc non Af Amer: 20 mL/min — ABNORMAL LOW (ref >60)
Glucose, Bld: 118 mg/dL — ABNORMAL HIGH (ref 70–99)
Potassium: 5.1 mmol/L (ref 3.5–5.1)
Sodium: 141 mmol/L (ref 135–145)
Total Bilirubin: 5.2 mg/dL — ABNORMAL HIGH (ref 0.3–1.2)
Total Protein: 6.6 g/dL (ref 6.5–8.1)

## 2019-09-30 IMAGING — NM NM HEPATOBILIARY IMAGE, INC GB
3 series · 18 of 18 positions shown · non-contrast
Comparison: Ultrasound abdomen [DATE]

CLINICAL DATA: Chronic upper abdominal pain, gallbladder wall
thickening on ultrasound, assess gallbladder motility

EXAM:
NUCLEAR MEDICINE HEPATOBILIARY IMAGING
TECHNIQUE: Sequential images of the abdomen were obtained [DATE] minutes
following intravenous administration of radiopharmaceutical. Due to
nonvisualization of the gallbladder, 3 mg of morphine was
administered and imaging was continued for an additional 30 minutes.
RADIOPHARMACEUTICALS:  5.3 mCi [NV]  Choletec IV

[he hepatobiliary · 4.52mm/px · 6 of 30 frames shown (1 of 3)]
[frame 3/30]
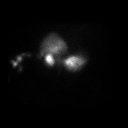
[frame 8/30]
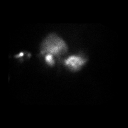
[frame 13/30]
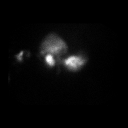
[frame 18/30]
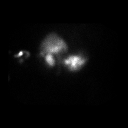
[frame 23/30]
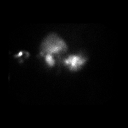
[frame 28/30]
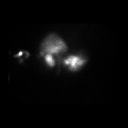

[he hepatobiliary · 4.52mm/px · 6 of 30 frames shown (2 of 3)]
[frame 3/30]
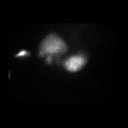
[frame 8/30]
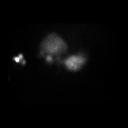
[frame 13/30]
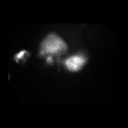
[frame 18/30]
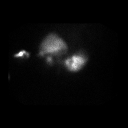
[frame 23/30]
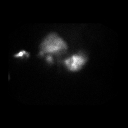
[frame 28/30]
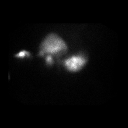

[he hepatobiliary · 4.52mm/px · 6 of 60 frames shown (3 of 3)]
[frame 6/60]
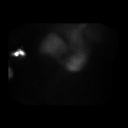
[frame 16/60]
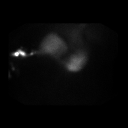
[frame 26/60]
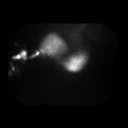
[frame 36/60]
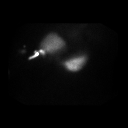
[frame 46/60]
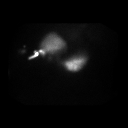
[frame 56/60]
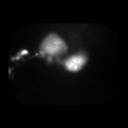

[18 of 18 positions shown; findings below may reference images not displayed]

FINDINGS: Delayed clearance of tracer from bloodstream indicating impaired
hepatocellular function. Abnormal appearance of liver with inferior
position and discontiguous appearance of the lateral segment LEFT
lobe versus remainder of liver, question hepatic anomaly.
Significant retention of tracer within hepatic parenchyma as well as
within dilated central biliary tree of the lateral segment LEFT
lobe. Delayed visualization of CBD. No definite bowel activity is
identified by the conclusion of the exam. Gallbladder is probably
visualized just before morphine administration with increased tracer
accumulation in probable gallbladder following morphine.
IMPRESSION: Potential hepatic developmental anomaly with discontinuous lateral
segment LEFT lobe liver, slightly inferiorly positioned.

Visualization of central biliary radicles, probably gallbladder, and
CBD.

No small bowel activity is identified to confirm CBD patency; in
light of the biliary dilatation identified by ultrasound, recommend
assessment by MR imaging with MRCP imaging to evaluate.

If patient is unable to undergo MR imaging, recommend CT assessment,
with IV contrast if renal function permits.

## 2019-09-30 MED ORDER — PREDNISOLONE ACETATE 1 % OP SUSP
1.0000 [drp] | Freq: Four times a day (QID) | OPHTHALMIC | Status: DC
  Administered 2019-09-30 – 2019-10-07 (×27): 1 [drp] via OPHTHALMIC
  Filled 2019-09-30: qty 5

## 2019-09-30 MED ORDER — BISACODYL 10 MG RE SUPP
10.0000 mg | Freq: Every day | RECTAL | Status: DC | PRN
  Administered 2019-10-02: 10 mg via RECTAL
  Filled 2019-09-30: qty 1

## 2019-09-30 MED ORDER — LIDOCAINE HCL URETHRAL/MUCOSAL 2 % EX GEL: CUTANEOUS | Status: DC | PRN

## 2019-09-30 MED ORDER — GUAIFENESIN-DM 100-10 MG/5ML PO SYRP: 5.0000 mL | ORAL_SOLUTION | Freq: Four times a day (QID) | ORAL | Status: DC | PRN

## 2019-09-30 MED ORDER — LEVOFLOXACIN 500 MG PO TABS
500.0000 mg | ORAL_TABLET | ORAL | Status: AC
  Administered 2019-10-02 – 2019-10-04 (×2): 500 mg via ORAL
  Filled 2019-09-30 (×2): qty 1

## 2019-09-30 MED ORDER — FUROSEMIDE 10 MG/ML IJ SOLN
40.0000 mg | Freq: Once | INTRAMUSCULAR | Status: AC
  Administered 2019-09-30: 40 mg via INTRAVENOUS
  Filled 2019-09-30: qty 4

## 2019-09-30 MED ORDER — LEVOFLOXACIN 500 MG PO TABS: 500.0000 mg | ORAL_TABLET | ORAL | Status: AC

## 2019-09-30 MED ORDER — POLYETHYLENE GLYCOL 3350 17 G PO PACK
17.0000 g | PACK | Freq: Every day | ORAL | Status: DC | PRN
  Filled 2019-09-30: qty 1

## 2019-09-30 MED ORDER — DOXYCYCLINE HYCLATE 100 MG PO TABS
100.0000 mg | ORAL_TABLET | Freq: Two times a day (BID) | ORAL | Status: AC
  Administered 2019-10-01 – 2019-10-03 (×6): 100 mg via ORAL
  Filled 2019-09-30 (×6): qty 1

## 2019-09-30 MED ORDER — PROCHLORPERAZINE 25 MG RE SUPP: 12.5000 mg | Freq: Four times a day (QID) | RECTAL | Status: DC | PRN

## 2019-09-30 MED ORDER — FINASTERIDE 5 MG PO TABS
5.0000 mg | ORAL_TABLET | Freq: Every day | ORAL | Status: DC
  Administered 2019-10-01 – 2019-10-04 (×4): 5 mg via ORAL
  Filled 2019-09-30 (×6): qty 1

## 2019-09-30 MED ORDER — CARVEDILOL 3.125 MG PO TABS
3.1250 mg | ORAL_TABLET | Freq: Two times a day (BID) | ORAL | Status: DC
  Administered 2019-10-01 – 2019-10-05 (×10): 3.125 mg via ORAL
  Filled 2019-09-30 (×13): qty 1

## 2019-09-30 MED ORDER — MORPHINE SULFATE (PF) 4 MG/ML IV SOLN
INTRAVENOUS | Status: AC
  Administered 2019-09-30: 3 mg via INTRAVENOUS
  Filled 2019-09-30: qty 1

## 2019-09-30 MED ORDER — GATIFLOXACIN 0.5 % OP SOLN
1.0000 [drp] | Freq: Four times a day (QID) | OPHTHALMIC | Status: DC
  Administered 2019-09-30 – 2019-10-07 (×27): 1 [drp] via OPHTHALMIC
  Filled 2019-09-30: qty 2.5

## 2019-09-30 MED ORDER — POLYETHYLENE GLYCOL 3350 17 G PO PACK: 17.0000 g | PACK | Freq: Two times a day (BID) | ORAL | 0 refills | Status: AC

## 2019-09-30 MED ORDER — POLYETHYLENE GLYCOL 3350 17 G PO PACK
17.0000 g | PACK | Freq: Two times a day (BID) | ORAL | Status: DC
  Administered 2019-09-30 – 2019-10-02 (×4): 17 g via ORAL
  Filled 2019-09-30 (×3): qty 1

## 2019-09-30 MED ORDER — NEOMYCIN-POLYMYXIN-DEXAMETH 3.5-10000-0.1 OP OINT
1.0000 "application " | TOPICAL_OINTMENT | Freq: Four times a day (QID) | OPHTHALMIC | Status: DC
  Administered 2019-09-30 – 2019-10-07 (×27): 1 via OPHTHALMIC
  Filled 2019-09-30: qty 3.5

## 2019-09-30 MED ORDER — SENNOSIDES-DOCUSATE SODIUM 8.6-50 MG PO TABS: 1.0000 | ORAL_TABLET | Freq: Two times a day (BID) | ORAL | Status: AC

## 2019-09-30 MED ORDER — NEOMYCIN-POLYMYXIN-DEXAMETH 3.5-10000-0.1 OP OINT: 1.0000 "application " | TOPICAL_OINTMENT | Freq: Four times a day (QID) | OPHTHALMIC | 0 refills | Status: AC

## 2019-09-30 MED ORDER — OXYCODONE HCL 5 MG PO TABS
5.0000 mg | ORAL_TABLET | ORAL | Status: DC | PRN
  Administered 2019-09-30 – 2019-10-06 (×17): 5 mg via ORAL
  Filled 2019-09-30 (×19): qty 1

## 2019-09-30 MED ORDER — LORATADINE 10 MG PO TABS
10.0000 mg | ORAL_TABLET | Freq: Every day | ORAL | Status: DC
  Administered 2019-10-01 – 2019-10-04 (×4): 10 mg via ORAL
  Filled 2019-09-30 (×6): qty 1

## 2019-09-30 MED ORDER — GATIFLOXACIN 0.5 % OP SOLN: 1.0000 [drp] | Freq: Four times a day (QID) | OPHTHALMIC | Status: AC

## 2019-09-30 MED ORDER — NYSTATIN 100000 UNIT/ML MT SUSP
5.0000 mL | Freq: Four times a day (QID) | OROMUCOSAL | Status: DC
  Administered 2019-10-01 – 2019-10-07 (×15): 500000 [IU] via ORAL
  Filled 2019-09-30 (×26): qty 5

## 2019-09-30 MED ORDER — CARVEDILOL 3.125 MG PO TABS
3.1250 mg | ORAL_TABLET | Freq: Two times a day (BID) | ORAL | Status: DC
  Administered 2019-09-30: 3.125 mg via ORAL
  Filled 2019-09-30: qty 1

## 2019-09-30 MED ORDER — ACETAMINOPHEN 325 MG PO TABS
325.0000 mg | ORAL_TABLET | ORAL | Status: DC | PRN
  Administered 2019-10-01 – 2019-10-04 (×6): 650 mg via ORAL
  Filled 2019-09-30 (×7): qty 2

## 2019-09-30 MED ORDER — DOXYCYCLINE HYCLATE 100 MG PO TABS: 100.0000 mg | ORAL_TABLET | Freq: Two times a day (BID) | ORAL | 0 refills | Status: AC

## 2019-09-30 MED ORDER — SENNOSIDES-DOCUSATE SODIUM 8.6-50 MG PO TABS
1.0000 | ORAL_TABLET | Freq: Two times a day (BID) | ORAL | Status: DC
  Administered 2019-09-30 – 2019-10-06 (×10): 1 via ORAL
  Filled 2019-09-30 (×13): qty 1

## 2019-09-30 MED ORDER — NYSTATIN 100000 UNIT/ML MT SUSP: 5.0000 mL | Freq: Four times a day (QID) | OROMUCOSAL | 0 refills | Status: AC

## 2019-09-30 MED ORDER — ALUM & MAG HYDROXIDE-SIMETH 200-200-20 MG/5ML PO SUSP: 30.0000 mL | ORAL | Status: DC | PRN

## 2019-09-30 MED ORDER — DOXYCYCLINE HYCLATE 100 MG PO TABS
100.0000 mg | ORAL_TABLET | Freq: Two times a day (BID) | ORAL | Status: DC
  Administered 2019-09-30: 100 mg via ORAL
  Filled 2019-09-30: qty 1

## 2019-09-30 MED ORDER — PROCHLORPERAZINE MALEATE 5 MG PO TABS: 5.0000 mg | ORAL_TABLET | Freq: Four times a day (QID) | ORAL | Status: DC | PRN

## 2019-09-30 MED ORDER — TECHNETIUM TC 99M MEBROFENIN IV KIT
5.3000 | PACK | Freq: Once | INTRAVENOUS | Status: AC | PRN
  Administered 2019-09-30: 5.3 via INTRAVENOUS

## 2019-09-30 MED ORDER — HYDROCORTISONE NA SUCCINATE PF 100 MG IJ SOLR
100.0000 mg | Freq: Three times a day (TID) | INTRAMUSCULAR | Status: DC
  Administered 2019-09-30 – 2019-10-01 (×2): 100 mg via INTRAVENOUS
  Filled 2019-09-30 (×3): qty 2

## 2019-09-30 MED ORDER — FLEET ENEMA 7-19 GM/118ML RE ENEM: 1.0000 | ENEMA | Freq: Once | RECTAL | Status: DC | PRN

## 2019-09-30 MED ORDER — LEVOFLOXACIN 500 MG PO TABS: 500.0000 mg | ORAL_TABLET | ORAL | Status: DC

## 2019-09-30 MED ORDER — DULOXETINE HCL 30 MG PO CPEP
60.0000 mg | ORAL_CAPSULE | Freq: Every day | ORAL | Status: DC
  Administered 2019-10-01 – 2019-10-04 (×4): 60 mg via ORAL
  Filled 2019-09-30 (×6): qty 2

## 2019-09-30 MED ORDER — MIRABEGRON ER 25 MG PO TB24
50.0000 mg | ORAL_TABLET | Freq: Every day | ORAL | Status: DC
  Administered 2019-10-01 – 2019-10-07 (×5): 50 mg via ORAL
  Filled 2019-09-30 (×6): qty 2

## 2019-09-30 MED ORDER — PROCHLORPERAZINE EDISYLATE 10 MG/2ML IJ SOLN: 5.0000 mg | Freq: Four times a day (QID) | INTRAMUSCULAR | Status: DC | PRN

## 2019-09-30 MED ORDER — SODIUM ZIRCONIUM CYCLOSILICATE 10 G PO PACK
10.0000 g | PACK | Freq: Every day | ORAL | Status: DC
  Administered 2019-10-01 – 2019-10-04 (×4): 10 g via ORAL
  Filled 2019-09-30 (×4): qty 1

## 2019-09-30 MED ORDER — MORPHINE SULFATE (PF) 4 MG/ML IV SOLN: 3.0000 mg | Freq: Once | INTRAVENOUS | Status: AC

## 2019-09-30 MED ORDER — PREDNISOLONE ACETATE 1 % OP SUSP: 1.0000 [drp] | Freq: Four times a day (QID) | OPHTHALMIC | 0 refills | Status: AC

## 2019-09-30 NOTE — Progress Notes (Signed)
Inpatient Rehab Admissions Coordinator:  There is a bed available in CIR to admit pt today. Pt will be admitted today.  Dr. Reesa Chew in agreement. Family, NSG, and TOC made aware.  Gayland Curry, [place], [place] Admissions Coordinator [phone]

## 2019-09-30 NOTE — Progress Notes (Addendum)
Inpatient Rehabilitation Medication Review by a Pharmacist  A complete drug regimen review was completed for this patient to identify any potential clinically significant medication issues.  Clinically significant medication issues were identified:  yes   Type of Medication Issue Identified Description of Issue Urgent (address now) Non-Urgent (address on AM team rounds) Plan Plan Accepted by Provider? (Yes / No / Pending AM Rounds)  Drug Interaction(s) (clinically significant)       Duplicate Therapy       Allergy       No Medication Administration End Date  Doxycycline and Levofloxacin PO are to continue for 3 days post discharge per summary Non-urgent Please consider adding stop dates Pending AM rounds  Incorrect Dose       Additional Drug Therapy Needed  Patient was to resume propranolol>>switched to coreg, and sinemet at discharge (although not on during acute admission and not started at home) per d/c summary Non-urgent Please evaluate patient for continuation of sinemet.  Pending AM rounds  Other         Name of provider notified for urgent issues identified: n/a  Provider Method of Notification: n/a   For non-urgent medication issues to be resolved on team rounds tomorrow morning a [facility] [employer] was sent to: Reesa Chew, PA   Pharmacist comments:   Time spent performing this drug regimen review (minutes):  10   Zoraya Fiorenza A. Levada Dy, PharmD, BCPS, FNKF Clinical Pharmacist [place] Please utilize Am[facility] for appropriate phone number to reach the unit pharmacist ([place])  09/30/2019 8:27 PM

## 2019-09-30 NOTE — Progress Notes (Signed)
OT Cancellation Note  Patient Details Name: Nicholas Ramirez MRN: [id number] DOB: 03-02-1945   Cancelled Treatment:    Reason Eval/Treat Not Completed: Patient at procedure or test/ unavailable- pt off unit at this time. Will follow and see as able.   Jolaine Artist, OT Acute Rehabilitation Services Pager [phone] Office [phone]   [name] 09/30/2019, 9:25 AM

## 2019-09-30 NOTE — H&P (Addendum)
Physical Medicine and Rehabilitation Admission H&P        Chief Complaint  Patient presents with  . Fall with functional deficits    HPI: Nicholas Ramirez is a 74 yo male with history of HTN, hepatitis C who has had multiple falls at home over the last few months. Pt was seen by neurology and after extensive work up it was felt that he was suffering from potential Parkinson's Disease. Dr. Tomi Likens started him sinemet 09/13 with MRI C spine ordered for work up.  Pt was admitted on 09/18/19 after a fall at home where his head struck a Ecologist. 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 Ophthalmology was consulted and patient underwent surgical repair of the left eye by Dr. Eulas Post on 9/16. Pt was placed on broad spectrum antibiotics for coverage. To continue eye shield at all times and eye drops for one month post op.   Pt subsequently developed AKI, nephrology was consulted and vancomycin was discontinued. 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 Renal function has slowly improved, no HD necessary and nephrology following for input.  Abdominal ultrasound showed distended GB with intrahepatic biliary duct dilatation and suspicion of cirrhosis.  Elevated LFT being monitored and HIDA scan ordered due to issues with abdominal pain and concerns of acute cholecystitis-->negative for acute pathology and no surgical intervention needed.  Pt was screened by the inpatient rehab team, and it was felt that he could benefit from interdisciplinary rehab to address his balance and functional deficits related to his PD and recent fall/med-surg complications.      Review of Systems  Constitutional: Negative for chills and fever.  HENT: Negative for hearing loss and tinnitus.   Eyes: Positive for blurred vision.  Respiratory: Negative for cough and shortness of  breath.   Cardiovascular: Positive for leg swelling. Negative for chest pain and palpitations.  Gastrointestinal: Positive for abdominal pain. Negative for constipation, heartburn and nausea.  Genitourinary: Negative for dysuria and urgency.  Musculoskeletal: Negative for back pain, myalgias and neck pain.  Skin: Positive for itching (back). Negative for rash.  Neurological: Positive for dizziness, speech change, focal weakness and headaches.            Past Medical History:  Diagnosis Date  . Asthma    . Depression    . Heart murmur    . Hepatitis C    . Hypertension             Past Surgical History:  Procedure Laterality Date  . CARPAL TUNNEL RELEASE Left    . RUPTURED GLOBE EXPLORATION AND REPAIR Left 09/18/2019    Procedure: REPAIR OF RUPTURED GLOBE;  Surgeon: Lonia Skinner, MD;  Location: [facility];  Service: Ophthalmology;  Laterality: Left;           Family History  Problem Relation Age of Onset  . Alzheimer's disease Mother    . High blood pressure Mother    . Cancer Father        Social History:  reports that he quit smoking about 34 years ago. He has never used smokeless tobacco. He reports previous drug use. He reports that he does not drink alcohol.           Allergies  Allergen Reactions  . Oxybutynin Other (See Comments)      cramps  .  Sertraline Itching  . Tadalafil Rash          Medications Prior to Admission  Medication Sig Dispense Refill  . aspirin 81 MG EC tablet Take 81 mg by mouth daily.      . cetirizine (ZYRTEC) 10 MG tablet Take 10 mg by mouth daily.      . DULoxetine (CYMBALTA) 60 MG capsule Take 60 mg by mouth daily.      . finasteride (PROSCAR) 5 MG tablet Take 5 mg by mouth daily.      . hydrochlorothiazide (HYDRODIURIL) 25 MG tablet Take 25 mg by mouth daily.      Marland Kitchen lisinopril (ZESTRIL) 40 MG tablet Take 40 mg by mouth daily.      Marland Kitchen MYRBETRIQ 50 MG TB24 tablet Take 50 mg by mouth daily.      . propranolol (INDERAL) 40 MG tablet Take  40 mg by mouth 2 (two) times daily.      . carbidopa-levodopa (SINEMET IR) 25-100 MG tablet Take 1/2 tablet in AM and bedtime for one week, then 1/2 tablet three times daily for one week, then take 1 tablet three times daily. 90 tablet 0      Drug Regimen Review  Drug regimen was reviewed and remains appropriate with no significant issues identified   Home: Home Living Family/patient expects to be discharged to:: Private residence Living Arrangements: Spouse/significant other Available Help at Discharge: Available 24 hours/day (granddaughers come to visit and assist as needed) Type of Home: House Home Access: Level entry Home Layout: One level Bathroom Shower/Tub: Chiropodist: Standard Home Equipment: Hand held shower head, Shower seat Additional Comments: no AD or grab bars. pt unable to grasp with LUE   Functional History: Prior Function Level of Independence: Needs assistance Gait / Transfers Assistance Needed: supervision for gait with HHA, pt with multiple falls at home ADL's / Homemaking Assistance Needed: spouse assist with bathing, dressing; pt able to feed and groom Comments: high frequency of falls, unable to grip or use LUE functionally following carpal tunnel surgery a few years ago   Functional Status:  Mobility: Bed Mobility Overal bed mobility: Needs Assistance Bed Mobility: Rolling, Supine to Sit, Sit to Supine Rolling: Min assist Supine to sit: Max assist, +2 for safety/equipment Sit to supine: Max assist, +2 for safety/equipment General bed mobility comments: pt sidelying upon entry on L side, transitoned to EOB with increased time/cueing to initate movement of BLEs towards EOB and max assist for trunk; returned to sidelying with cueing for trunkal initation and BLE support to ascend into bed Transfers Overall transfer level: Needs assistance Equipment used: Ambulation equipment used Transfer via Lift Equipment: Stedy Transfers: Sit  to/from [employer] to Stand: Min assist, +2 physical assistance, +2 safety/equipment General transfer comment: NT  Ambulation/Gait Ambulation/Gait assistance: Min Web designer (Feet):  (March in place in stedy) Assistive device: 2 person hand held assist, 1 person hand held assist Gait Pattern/deviations: Step-to pattern, Shuffle, Wide base of support General Gait Details: Cues to try weight shifting in standing. Gait velocity: decreased Gait velocity interpretation: <1.31 ft/sec, indicative of household ambulator   ADL: ADL Overall ADL's : Needs assistance/impaired Grooming: Maximal assistance, Sitting, Oral care Grooming Details (indicate cue type and reason): brushed teeth while seated EOB, support for balance. required verbal and physical cueing to correct anterior lateral lean to the left Upper Body Bathing: Maximal assistance, Sitting Lower Body Bathing: Maximal assistance, Sitting/lateral leans Lower Body Bathing Details (indicate cue type and reason):  max assist to maintain balance while donning lotion to B lower legs due to L lateral and anterior lean  Upper Body Dressing : Maximal assistance, Sitting Lower Body Dressing: Total assistance, +2 for physical assistance, +2 for safety/equipment, Sit to/from stand Toilet Transfer: Minimal assistance, +2 for physical assistance, +2 for safety/equipment Toilet Transfer Details (indicate cue type and reason): with use of stedy. Pt able to rise into standing with multimodal cues for appropriate posture (tendency to assume L lateral lean) Functional mobility during ADLs: Minimal assistance, +2 for physical assistance, +2 for safety/equipment (with use of stedy) General ADL Comments: limited by decreased funcitonal use of L UE, impaired sitting balance with anterior lateral lean to the left, decreased activity tolerance, and impaired cognition   Cognition: Cognition Overall Cognitive Status: Impaired/Different from  baseline Orientation Level: Oriented X4 Cognition Arousal/Alertness: Awake/alert Behavior During Therapy: WFL for tasks assessed/performed Overall Cognitive Status: Impaired/Different from baseline Area of Impairment: Attention, Following commands, Safety/judgement, Problem solving, Awareness Current Attention Level: Sustained Following Commands: Follows multi-step commands inconsistently, Follows one step commands with increased time Safety/Judgement: Decreased awareness of safety, Decreased awareness of deficits Awareness: Emergent Problem Solving: Slow processing, Decreased initiation, Difficulty sequencing, Requires tactile cues, Requires verbal cues General Comments: better initiation to reach with R UE for rail while seated. still required verbal and physical cues     Blood pressure (!) 141/90, pulse (!) 110, temperature 98.5 F (36.9 C), temperature source Oral, resp. rate 18, height 5\' 9"  (1.753 m), weight 110.7 kg, SpO2 96 %. Physical Exam Vitals and nursing note reviewed.  Constitutional:      Appearance: Normal appearance.  Musculoskeletal:        General: Swelling present.     Comments: 2+ edema BLE with stasis changes.   Neurological:     Mental Status: He is alert and oriented to person, place, and time.     Comments: Dysphonia noted. Flat affect.      General: No acute distress Mood and affect are appropriate Heart: Regular rate and rhythm no rubs murmurs or extra sounds Lungs: Clear to auscultation, breathing unlabored, no rales or wheezes Abdomen: Positive bowel sounds,Mild RUQ tenderness to palpation, nondistended  Skin: No evidence of breakdown, no evidence of rash Neurologic: Cranial nerves II through XII intact, motor strength is 4/5 in bilateral deltoid, bicep, tricep, grip, 3- hip flexor, knee extensors, ankle dorsiflexor and plantar flexor Sensation reported as normal to LT BUE and BLE Musculoskeletal: . No joint swelling   Lab Results Last 48 Hours         Results for orders placed or performed during the hospital encounter of 09/18/19 (from the past 48 hour(s))  CBC     Status: Abnormal    Collection Time: 09/29/19 10:01 AM  Result Value Ref Range    WBC 11.2 (H) 4.0 - 10.5 K/uL    RBC 3.94 (L) 4.22 - 5.81 MIL/uL    Hemoglobin 13.5 13.0 - 17.0 g/dL    HCT 39.8 39 - 52 %    MCV 101.0 (H) 80.0 - 100.0 fL    MCH 34.3 (H) 26.0 - 34.0 pg    MCHC 33.9 30.0 - 36.0 g/dL    RDW 18.6 (H) 11.5 - 15.5 %    Platelets 96 (L) 150 - 400 K/uL      Comment: SPECIMEN CHECKED FOR CLOTS Immature Platelet Fraction may be clinically indicated, consider ordering this additional test [id number] CONSISTENT WITH PREVIOUS RESULT      nRBC 0.0 0.0 -  0.2 %      Comment: Performed at [facility], [place] [address], [place], [place] [address]  Comprehensive metabolic panel     Status: Abnormal    Collection Time: 09/29/19 10:01 AM  Result Value Ref Range    Sodium 143 135 - 145 mmol/L    Potassium 5.1 3.5 - 5.1 mmol/L    Chloride 110 98 - 111 mmol/L    CO2 24 22 - 32 mmol/L    Glucose, Bld 128 (H) 70 - 99 mg/dL      Comment: Glucose reference range applies only to samples taken after fasting for at least 8 hours.    BUN 78 (H) 8 - 23 mg/dL    Creatinine, Ser 2.95 (H) 0.61 - 1.24 mg/dL    Calcium 8.4 (L) 8.9 - 10.3 mg/dL    Total Protein 7.4 6.5 - 8.1 g/dL    Albumin 1.8 (L) 3.5 - 5.0 g/dL    AST 112 (H) 15 - 41 U/L    ALT 102 (H) 0 - 44 U/L    Alkaline Phosphatase 151 (H) 38 - 126 U/L    Total Bilirubin 5.2 (H) 0.3 - 1.2 mg/dL    GFR calc non Af Amer 20 (L) >60 mL/min    GFR calc Af Amer 23 (L) >60 mL/min    Anion gap 9 5 - 15      Comment: Performed at [facility] [address], [place], [place] [address]  Magnesium     Status: None    Collection Time: 09/29/19 10:01 AM  Result Value Ref Range    Magnesium 1.9 1.7 - 2.4 mg/dL      Comment: Performed at [facility] [address], [place], [place] [address]  Phosphorus      Status: Abnormal    Collection Time: 09/29/19 10:01 AM  Result Value Ref Range    Phosphorus 5.3 (H) 2.5 - 4.6 mg/dL      Comment: Performed at [facility] [address] [place], [place] [address]  Comprehensive metabolic panel     Status: Abnormal    Collection Time: 09/30/19  4:29 AM  Result Value Ref Range    Sodium 141 135 - 145 mmol/L    Potassium 5.1 3.5 - 5.1 mmol/L    Chloride 108 98 - 111 mmol/L    CO2 24 22 - 32 mmol/L    Glucose, Bld 118 (H) 70 - 99 mg/dL      Comment: Glucose reference range applies only to samples taken after fasting for at least 8 hours.    BUN 82 (H) 8 - 23 mg/dL    Creatinine, Ser 2.91 (H) 0.61 - 1.24 mg/dL    Calcium 8.4 (L) 8.9 - 10.3 mg/dL    Total Protein 6.6 6.5 - 8.1 g/dL    Albumin 1.8 (L) 3.5 - 5.0 g/dL    AST 109 (H) 15 - 41 U/L    ALT 96 (H) 0 - 44 U/L    Alkaline Phosphatase 140 (H) 38 - 126 U/L    Total Bilirubin 5.2 (H) 0.3 - 1.2 mg/dL    GFR calc non Af Amer 20 (L) >60 mL/min    GFR calc Af Amer 24 (L) >60 mL/min    Anion gap 9 5 - 15      Comment: Performed at [facility] [address], [place],  [address]       Imaging Results (  Last 48 hours)  NM Hepatobiliary Liver Func   Result Date: 09/30/2019 CLINICAL DATA:  Chronic upper abdominal pain, gallbladder wall thickening on ultrasound, assess gallbladder motility EXAM: NUCLEAR MEDICINE HEPATOBILIARY IMAGING TECHNIQUE: Sequential images of the abdomen were obtained out to 60 minutes following intravenous administration of radiopharmaceutical. Due to nonvisualization of the gallbladder, 3 mg of morphine was administered and imaging was continued for an additional 30 minutes. RADIOPHARMACEUTICALS:  5.3 mCi Tc-81m  Choletec IV COMPARISON:  Ultrasound abdomen 09/27/2019 FINDINGS: Delayed clearance of tracer from bloodstream indicating impaired hepatocellular function. Abnormal appearance of liver with inferior position and discontiguous appearance of the lateral  segment LEFT lobe versus remainder of liver, question hepatic anomaly. Significant retention of tracer within hepatic parenchyma as well as within dilated central biliary tree of the lateral segment LEFT lobe. Delayed visualization of CBD. No definite bowel activity is identified by the conclusion of the exam. Gallbladder is probably visualized just before morphine administration with increased tracer accumulation in probable gallbladder following morphine. 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 Visualization of central biliary radicles, probably gallbladder, and CBD. No small bowel activity is identified to confirm CBD patency; in light of the biliary dilatation identified by ultrasound, recommend assessment by MR imaging with MRCP imaging to evaluate. If patient is unable to undergo MR imaging, recommend CT assessment, with IV contrast if renal function permits. Electronically Signed   By: Lavonia Dana M.D.   On: 09/30/2019 14:25             Medical Problem List and Plan: 1.  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 Recent fall with rupture of anterior left globe             -patient may shower             -ELOS/Goals: 14-18d Mod A goal 2.  Antithrombotics: -DVT/anticoagulation:  Mechanical:  Antiembolism stockings, knee (TED hose) Bilateral lower extremities Sequential compression devices, below knee Bilateral lower extremities             -antiplatelet therapy: n/a 3. Abdominal pain/Pain Management: Has been getting prn IV dilaudid/morphine-->will transition to oxycodone  prn 4. Mood: team to provide ego support             -antipsychotic agents: n/a 5. Neuropsych: This patient is not capable of making decisions on his own behalf. 6. Skin/Wound Care: local care as indicated 7. Fluids/Electrolytes/Nutrition: encourage appropriate po             -check CMET tomorrow 8. Ruptured  globe left eye s/p surgical repair 9/16 by ophthalmology Dr. Eulas Post             -eye shield at all times             -broad spectrum coverage transitioned to levofloxacin and doxycycline for 3 additional days             -continue eye gtts for 2 additional weeks.              -outpt f/u with Dr. Eulas Post after discharge 9. HTN:             -pt on lisinopril, amlodipine, propranolol at home             -Coreg added today ---continue to monitor BP tid and resume meds as indicated.  10. AKI: multifactorial, likely related to ATN v/s vancomycin             -  Cr peaked at 5 and currently 2.91             -Lokelma on board for hyperkalemia             -             -mirabegron ER and finasteride resumed             -no HD, continue supportive care 11.Parkinson's Disease             -sinemet added recently but not started per notes.              -Pt followed by Dr. Tomi Likens 12. Hyponatremia, mild SIADH             -sodium 139, resolved 13. Prolonged QT interval: Avoid rate prolongation meds--coreg resumed today.              --repeat EKG in am. 14. Constipation             -senna-s two tabs at HS 15. Distended GB/Elevated LFT's             -recent trend up             -HIDA scan negative. Some chronic findings due to abnl small bowel motility     Bary Leriche, PA-C 09/30/2019 "I have personally performed a face to face diagnostic evaluation of this patient.  Additionally, I have reviewed and concur with the physician assistant's documentation above." Charlett Blake M.D. [facility] FAAPM&R (Neuromuscular Med) Diplomate Am Board of Electrodiagnostic Med Fellow Am Board of Interventional Pain

## 2019-09-30 NOTE — Progress Notes (Signed)
PT Cancellation Note  Patient Details Name: Nicholas Ramirez MRN: [id number] DOB: 10-24-1945   Cancelled Treatment:    Reason Eval/Treat Not Completed: Patient at procedure or test/unavailable   Currently at [facility];   Will follow up later today as time allows;  Otherwise, will follow up for PT tomorrow;   Thank you,  Roney Marion, PT  Acute Rehabilitation Services Pager [phone] Office [phone]     Colletta [place] 09/30/2019, 10:37 AM

## 2019-09-30 NOTE — Progress Notes (Signed)
Received pt to floor in low bed from previous unit. Pt alert to self, place, and situation. Call bell within reach.

## 2019-09-30 NOTE — Progress Notes (Signed)
PMR Admission Coordinator Pre-Admission Assessment  Patient: Nicholas Ramirez is an 74 y.o., male MRN: [id number] DOB: 12/06/1945 Height: 5' 9" (175.3 cm) Weight: 101.6 kg  Insurance Information HMO:     PPO:      PCP:      IPA:      80/20:      OTHER:  PRIMARY: [employer] Medicare      Policy#: [id number]      Subscriber: Pt.  CM Name: Lizelle      Phone#: [phone]      Fax#:   Pre-Cert#:    [id number]   Approval for 9/24 for 7 days.  Updates due on 10/03/19.  Employer:  Benefits:  Phone #: [phone]      Name: Eff. Date:  04/04/2019 - present     Deduct: $0 (does not have deductible)      Out of Pocket Max: $6,700 ($372.21 met)       Life Max: n/a CIR:$430/day co-pay for days 1-4, $0/day co-pay for days 6+   SNF: $0/day co-pay for days 1-20, $184/day co-pay for days Outpatient:$40/visit co-pay; limited by medical necessity Home Health: 100% coverage, 0% co-insurance; limited by medical necessity DME: 80%     Co-Pay: 20% Providers: in network  SECONDARY: n/a The "Data Collection Information Summary" for patients in Inpatient Rehabilitation Facilities with attached "Privacy Act Statement-Health Care Records" was provided and verbally reviewed with: patient, family  Emergency Contact Information Contact Information    Name Relation Home Work Mobile   Miao,Anna Spouse [phone]  [phone]   Steward,Deanna Daughter [phone]  [phone]      Current Medical History  Patient Admitting Diagnosis: Fall with injury to the eye History of Present Illness: Patient had multiple falls over the last few months at home. He has been evaluated at outpatient neurology (Dr Jaffe) and has had CT scan and MRI of his brain performed. He reported he has had generalized weakness with a slow unsteady gait. He was recently started on levodopa, carbidopa for possible Parkinson's disease by neurology.He fell on the dresser and has sustained a laceration to the side of his eye and hit his  eye. He was unable to see and EMS was called. He reports that he had normal vision with his glasses on in both eyes until this fall. He reports he cannot see out of the left eye after his fall. 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  Ophthalmology has been consulted and  He underwent surgical repair 9/16. Patient was given broad-spectrum antibiotics in the emergency room as well as a tetanus shot. Stay complicated by AKI and worsening renal function and nephrology was consulted.     Patient's medical record from [facility] has been reviewed by the rehabilitation admission coordinator and physician.  Past Medical History  Past Medical History:  Diagnosis Date  . Asthma   . Depression   . Heart murmur   . Hepatitis C   . Hypertension     Family History   family history is not on file.  Prior Rehab/Hospitalizations Has the patient had prior rehab or hospitalizations prior to admission? No  Has the patient had major surgery during 100 days prior to admission? Yes   Current Medications  Current Facility-Administered Medications:  .  carbidopa-levodopa (SINEMET IR) 25-100 MG per tablet immediate release 0.5   tablet, 0.5 tablet, Oral, BID, Rai, Ripudeep K, MD, 0.5 tablet at 09/25/19 0943 .  Chlorhexidine Gluconate Cloth 2 % PADS 6 each, 6 each, Topical, Daily, Kumar, Pardeep, MD, 6 each at 09/25/19 0948 .  doxycycline (VIBRA-TABS) tablet 100 mg, 100 mg, Oral, Q12H, Glenn, Steven J, MD, 100 mg at 09/25/19 0943 .  DULoxetine (CYMBALTA) DR capsule 60 mg, 60 mg, Oral, Daily, Rai, Ripudeep K, MD, 60 mg at 09/25/19 0942 .  finasteride (PROSCAR) tablet 5 mg, 5 mg, Oral, Daily, Rai, Ripudeep K, MD, 5 mg at 09/25/19 0944 .  gatifloxacin (ZYMAXID) 0.5 % ophthalmic drops 1 drop, 1 drop, Left Eye, QID, Glenn, Steven J, MD, 1  drop at 09/25/19 1429 .  hydrALAZINE (APRESOLINE) injection 10 mg, 10 mg, Intravenous, Q6H PRN, Shalhoub, George J, MD, 10 mg at 09/19/19 0307 .  HYDROcodone-acetaminophen (NORCO/VICODIN) 5-325 MG per tablet 1-2 tablet, 1-2 tablet, Oral, Q4H PRN, Kumar, Pardeep, MD, 2 tablet at 09/23/19 0500 .  hydrocortisone sodium succinate (SOLU-CORTEF) 100 MG injection 100 mg, 100 mg, Intravenous, Q8H, Kruska, Lindsay A, MD, 100 mg at 09/25/19 1147 .  HYDROmorphone (DILAUDID) injection 1 mg, 1 mg, Intravenous, Q3H PRN, Chotiner, Bradley S, MD, 1 mg at 09/25/19 1434 .  levofloxacin (LEVAQUIN) tablet 500 mg, 500 mg, Oral, Q48H, Glenn, Steven J, MD, 500 mg at 09/25/19 0942 .  loratadine (CLARITIN) tablet 10 mg, 10 mg, Oral, Daily, Rai, Ripudeep K, MD, 10 mg at 09/25/19 0944 .  mirabegron ER (MYRBETRIQ) tablet 50 mg, 50 mg, Oral, Daily, Rai, Ripudeep K, MD, 50 mg at 09/25/19 0942 .  neomycin-polymyxin b-dexamethasone (MAXITROL) ophthalmic ointment 1 application, 1 application, Left Eye, QID, Glenn, Steven J, MD, 1 application at 09/25/19 1429 .  prednisoLONE acetate (PRED FORTE) 1 % ophthalmic suspension 1 drop, 1 drop, Left Eye, QID, Glenn, Steven J, MD, 1 drop at 09/25/19 1429 .  promethazine (PHENERGAN) tablet 12.5 mg, 12.5 mg, Oral, Q6H PRN, Chotiner, Bradley S, MD .  senna-docusate (Senokot-S) tablet 1 tablet, 1 tablet, Oral, BID, Kumar, Pardeep, MD, 1 tablet at 09/25/19 0944  Patients Current Diet:  Diet Order            Diet Heart Room service appropriate? Yes; Fluid consistency: Thin  Diet effective now                 Precautions / Restrictions Precautions Precautions: Fall Precaution Comments: pt admitted for a fall, daughter reports 15-20 in last 6 months Restrictions Weight Bearing Restrictions: No   Has the patient had 2 or more falls or a fall with injury in the past year? Yes  Prior Activity Level Limited Community (1-2x/wk): Pt. went out only occasionally for appointments  Prior  Functional Level Self Care: Did the patient need help bathing, dressing, using the toilet or eating? Needed some help  Indoor Mobility: Did the patient need assistance with walking from room to room (with or without device)? Independent  Stairs: Did the patient need assistance with internal or external stairs (with or without device)? Dependent  Functional Cognition: Did the patient need help planning regular tasks such as shopping or remembering to take medications? Needed some help  Home Assistive Devices / Equipment Home Assistive Devices/Equipment: Walker (specify type), Wheelchair Home Equipment: Hand held shower head, Shower seat  Prior Device Use: Indicate devices/aids used by the patient prior to current illness, exacerbation or injury? None of the above  Current Functional Level Cognition  Overall Cognitive Status: Impaired/Different from baseline Current Attention Level: Selective   Orientation Level: Oriented X4 Following Commands: Follows multi-step commands inconsistently, Follows one step commands with increased time Safety/Judgement: Decreased awareness of safety, Decreased awareness of deficits General Comments: pt with improved awareness today, able to state "I know when I am leaning". Continues to require cues for problem solving basic mobility/ADL tasks.    Extremity Assessment (includes Sensation/Coordination)  Upper Extremity Assessment: LUE deficits/detail, Generalized weakness LUE Deficits / Details: hx of shoudler surgery 50 yrs ago and carpal tunnel surgery 2 years ago; presenting with increased tone and limited AROM/PROM at shoulder, eblow, wrist and hand (family reports pt not functionally using since carpal tunnel surgery) LUE Coordination: decreased fine motor, decreased gross motor  Lower Extremity Assessment: Defer to PT evaluation    ADLs  Overall ADL's : Needs assistance/impaired Grooming: Maximal assistance, Sitting Upper Body Bathing: Maximal  assistance, Sitting Lower Body Bathing: Total assistance, +2 for physical assistance, +2 for safety/equipment, Sit to/from stand Upper Body Dressing : Maximal assistance, Sitting Lower Body Dressing: Total assistance, +2 for physical assistance, +2 for safety/equipment, Sit to/from stand Toilet Transfer: Minimal assistance, +2 for physical assistance, +2 for safety/equipment Toilet Transfer Details (indicate cue type and reason): with use of stedy. Pt able to rise into standing with multimodal cues for appropriate posture (tendency to assume L lateral lean) Functional mobility during ADLs: Minimal assistance, +2 for physical assistance, +2 for safety/equipment (with use of stedy) General ADL Comments: pt able to progress in ADL mobility with use of stedy for OOB activities.    Mobility  Overal bed mobility: Needs Assistance Bed Mobility: Supine to Sit, Sit to Supine Supine to sit: Mod assist, +2 for physical assistance, +2 for safety/equipment Sit to supine: Max assist, +2 for physical assistance, +2 for safety/equipment General bed mobility comments: performed transfer to strong R side. increased time and cueing needed to utilize rails. assist for BLE translation to EOB with additional truncal support. Increased assist of max A +2 back to bed. Pt with difficulty controlling trunk with return to supine position    Transfers  Overall transfer level: Needs assistance Equipment used: Ambulation equipment used Transfer via Lift Equipment: Stedy Transfers: Sit to/from Stand Sit to Stand: Min assist, +2 physical assistance, +2 safety/equipment General transfer comment: assist to power into standing in stedy frame. Increased assist/facilitation used on L side due to LUE weakness. Multimodal cues used for appropriate posture    Ambulation / Gait / Stairs / Wheelchair Mobility  Ambulation/Gait Ambulation/Gait assistance: Mod assist, +2 safety/equipment Gait Distance (Feet): 8 Feet Assistive  device: 2 person hand held assist, 1 person hand held assist Gait Pattern/deviations: Step-to pattern, Shuffle, Wide base of support General Gait Details: Slow, unsteady gait with wide BoS, difficulty advancing LLE, assist with weight shifting to advance LEs, posterior lean. Gait velocity: decreased Gait velocity interpretation: <1.31 ft/sec, indicative of household ambulator    Posture / Balance Dynamic Sitting Balance Sitting balance - Comments: fluctates due to posterior lean, min-mod assist but preference to at least 1 UE support Balance Overall balance assessment: Needs assistance Sitting-balance support: Feet supported, No upper extremity supported Sitting balance-Leahy Scale: Fair Sitting balance - Comments: fluctates due to posterior lean, min-mod assist but preference to at least 1 UE support Postural control: Posterior lean Standing balance support: During functional activity Standing balance-Leahy Scale: Poor Standing balance comment: relies on external support due to posterior lean    Special needs/care consideration  Skin Surgical incision: Left eye; Excoriated: leg; right/left/lower; Eccymosis: arm, hip, leg;; bilateral; Abrasion: flank; right; Blister:   leg; right/lateral; Cracking: foot/leg; bilateral and Designated Visitor Anna Vankuren, wife   Previous Home Environment (from acute therapy documentation) Living Arrangements: Spouse/significant other Available Help at Discharge: Available 24 hours/day (granddaughers come to visit and assist as needed) Type of Home: House Home Layout: One level Home Access: Level entry Bathroom Shower/Tub: Tub/shower unit Bathroom Toilet: Standard Home Care Services: No Additional Comments: no AD or grab bars. pt unable to grasp with LUE  Discharge Living Setting Plans for Discharge Living Setting: Patient's home Type of Home at Discharge: House Discharge Home Layout: One level Discharge Home Access: Level entry Discharge Bathroom  Shower/Tub: Tub/shower unit Discharge Bathroom Toilet: Standard Discharge Bathroom Accessibility: Yes How Accessible: Accessible via walker Does the patient have any problems obtaining your medications?: No  Social/Family/Support Systems Patient Roles: Partner Contact Information: [phone] Anticipated Caregiver: Anna Krygier  Anticipated Caregiver's Contact Information: [phone] Ability/Limitations of Caregiver: Can provide mod A Caregiver Availability: 24/7 Discharge Plan Discussed with Primary Caregiver: Yes Is Caregiver In Agreement with Plan?: Yes Does Caregiver/Family have Issues with Lodging/Transportation while Pt is in Rehab?: No  Goals Patient/Family Goal for Rehab: PT/OT Mod A Expected length of stay: 17-21 days  Pt/Family Agrees to Admission and willing to participate: Yes Program Orientation Provided & Reviewed with Pt/Caregiver Including Roles  & Responsibilities: Yes  Decrease burden of Care through IP rehab admission:  NA  Possible need for SNF placement upon discharge: not anticipated  Patient Condition: I have reviewed medical records from [facility], spoken with CM, and patient and spouse. I met with patient at the bedside and discussed via phone with wife for inpatient rehabilitation assessment.  Patient will benefit from ongoing PT, ST and OT, can actively participate in 3 hours of therapy a day 5 days of the week, and can make measurable gains during the admission.  Patient will also benefit from the coordinated team approach during an Inpatient Acute Rehabilitation admission.  The patient will receive intensive therapy as well as Rehabilitation physician, nursing, social worker, and care management interventions.  Due to bladder management, safety, skin/wound care, disease management, medication administration, pain management and patient education the patient requires 24 hour a day rehabilitation nursing.  The patient is currently Min A  +2 with transfers and Mod A +2 with mobility and Max A with basic ADLs.  Discharge setting and therapy post discharge at home with home health is anticipated.  Patient has agreed to participate in the Acute Inpatient Rehabilitation Program and will admit today.  Preadmission Screen Completed By:  Laura B Staley, 09/25/2019 2:35 PM ______________________________________________________________________   Discussed status with Dr. Kirstens on 09/30/19 at 4:59 PM  and received approval for admission today.  Admission Coordinator:  Laura B Staley, CCC-SLP, time 4:59 PM /Date 09/30/19    Assessment/Plan: Diagnosis: Debility after fall 1. Does the need for close, 24 hr/day Medical supervision in concert with the patient's rehab needs make it unreasonable for this patient to be served in a less intensive setting? Yes 2. Co-Morbidities requiring supervision/potential complications: Parkinson's disease, Left eye hyphema with visual loss 3. Due to bladder management, bowel management, safety, skin/wound care, disease management, medication administration, pain management and patient education, does the patient require 24 hr/day rehab nursing? Yes 4. Does the patient require coordinated care of a physician, rehab nurse, PT, OT, and SLP to address physical and functional deficits in the context of the above medical diagnosis(es)? Yes Addressing deficits in the following areas: balance, endurance, locomotion, strength, transferring, bowel/bladder control, bathing,   dressing, feeding, grooming, toileting, cognition, speech, language, swallowing and psychosocial support 5. Can the patient actively participate in an intensive therapy program of at least 3 hrs of therapy 5 days a week? Yes 6. The potential for patient to make measurable gains while on inpatient rehab is fair 7. Anticipated functional outcomes upon discharge from inpatient rehab: mod assist PT, mod assist OT, min assist SLP 8. Estimated rehab length  of stay to reach the above functional goals is: 14-18d 9. Anticipated discharge destination: Home 10. Overall Rehab/Functional Prognosis: good   MD Signature: Andrew E. Kirsteins M.D. [facility] FAAPM&R (Neuromuscular Med) Diplomate Am Board of Electrodiagnostic Med Fellow Am Board of Interventional Pain   

## 2019-09-30 NOTE — Progress Notes (Signed)
Patient ID: Nicholas Ramirez, male   DOB: 03/31/1945, 74 y.o.   MRN: [id number] [facility] Progress Note   Assessment/ Plan:   1. Acute kidney Injury: nonoliguric and with seemingly plateaued renal function overnight.  Etiology of acute kidney injury is thought to be multifactorial but predominantly hemodynamically mediated by hypotension possibly with evolution to ATN in the setting of ongoing HCTZ/ACE inhibitor and transient urine retention.  We will continue to monitor closely for renal recovery.  He does not have any indications for dialysis at this time. 2.  Hyperkalemia: Mild, continue to trend labs with ongoing renal recovery. 3.  Hypertension: Blood pressures remain elevated off antihypertensive therapy; he is on as needed hydralazine and I will start him on scheduled carvedilol 3.125 mg twice daily that should hopefully make an impact on his tachycardia as well. 4.  Ruptured globe left eye: Secondary to mechanical fall/trauma and status post surgical repair.  On antibiotics-levofloxacin and doxycycline. 5.  Recurrent falls/recent diagnosis of Parkinson disease. 6.  Abnormal LFTs: Initial ultrasound showing a thickened gallbladder wall and HIDA scan done today-results pending.  Subjective:   Denies any complaint at this time, had HIDA scan earlier.   Objective:   BP (!) 168/98 (BP Location: Right Arm)   Pulse (!) 102   Temp 97.8 F (36.6 C) (Oral)   Resp 16   Ht 5\' 9"  (1.753 m)   Wt 110.7 kg   SpO2 91%   BMI 36.04 kg/m   Intake/Output Summary (Last 24 hours) at 09/30/2019 1015 Last data filed at 09/30/2019 0601 Gross per 24 hour  Intake 1200 ml  Output 2150 ml  Net -950 ml   Weight change: -1.814 kg  Physical Exam: Gen: Appears comfortable resting in bed, nurse at bedside assisting him with meal.  Wearing shield over left eye. CVS: Pulse regular tachycardia, S1 and S2 normal Resp: Poor inspiratory effort with decreased breath sounds over bases, no distinct  rales Abd: Soft, obese, nontender Ext: 2+-3+ lower extremity edema.  Imaging: No results found.  Labs: BMET Recent Labs  Lab 09/24/19 0247 09/25/19 0618 09/26/19 1045 09/27/19 1050 09/28/19 0354 09/29/19 1001 09/30/19 0429  NA 133* 136 137 137 139 143 141  K 4.7 4.5 5.0 5.3* 6.0* 5.1 5.1  CL 100 106 107 106 107 110 108  CO2 20* 20* 19* 22 24 24 24   GLUCOSE 132* 128* 170* 150* 120* 128* 118*  BUN 75* 82* 89* 88* 86* 78* 82*  CREATININE 4.92* 4.28* 4.04* 3.51* 3.29* 2.95* 2.91*  CALCIUM 8.3* 8.3* 8.3* 8.1* 8.1* 8.4* 8.4*  PHOS 6.3* 5.6* 6.0* 5.4*  --  5.3*  --    CBC Recent Labs  Lab 09/24/19 0247 09/25/19 0618 09/26/19 1045 09/27/19 1050 09/28/19 0821 09/29/19 1001  WBC 8.3  --   --  8.5  --  11.2*  HGB 12.2*   < > 12.9* 12.0* 12.6* 13.5  HCT 34.2*   < > 37.2* 33.8* 36.1* 39.8  MCV 95.5  --   --  98.8  --  101.0*  PLT 113*  --   --  94*  --  96*   < > = values in this interval not displayed.    Medications:    . Chlorhexidine Gluconate Cloth  6 each Topical Daily  . doxycycline  100 mg Oral Q12H  . DULoxetine  60 mg Oral Daily  . finasteride  5 mg Oral Daily  . gatifloxacin  1 drop Left Eye QID  .  hydrocortisone sod succinate (SOLU-CORTEF) inj  100 mg Intravenous Q8H  . levofloxacin  500 mg Oral Q48H  . loratadine  10 mg Oral Daily  . mirabegron ER  50 mg Oral Daily  . neomycin-polymyxin b-dexamethasone  1 application Left Eye QID  . nystatin  5 mL Oral QID  . polyethylene glycol  17 g Oral BID  . prednisoLONE acetate  1 drop Left Eye QID  . senna-docusate  1 tablet Oral BID  . sodium zirconium cyclosilicate  10 g Oral Daily   Elmarie Shiley, MD 09/30/2019, 10:15 AM

## 2019-09-30 NOTE — IPOC Note (Signed)
Individualized overall Plan of Care [facility]) Patient Details Name: Nicholas Ramirez MRN: [id number] DOB: 1945-07-22  Admitting Diagnosis: Parkinson's disease [facility])  Hospital Problems: Principal Problem:   Parkinson's disease ([place]) Active Problems:   Debility   Abdominal distention   Hypoalbuminemia due to protein-calorie malnutrition ([place])   Hypokalemia   Dysphagia   Transaminitis   Hyperkalemia   AKI (acute kidney injury) ([place])     Functional Problem List: Nursing Bladder, Bowel, Edema, Endurance, Medication Management, Motor, Nutrition, Pain, Perception, Safety, Skin Integrity  PT Balance, Edema, Endurance, Motor, Pain, Perception, Safety, Sensory, Skin Integrity  OT Balance, Cognition, Edema, Endurance, Motor, Safety, Skin Integrity, Vision  SLP    TR         Basic ADL's: OT Eating, Grooming, Bathing, Dressing, Toileting     Advanced  ADL's: OT       Transfers: PT Bed Mobility, Bed to Chair, Car, Manufacturing systems engineer, Metallurgist: PT Ambulation, Emergency planning/management officer, Stairs     Additional Impairments: OT Fuctional Use of Upper Extremity  SLP        TR      Anticipated Outcomes Item Anticipated Outcome  Self Feeding Set up  Swallowing      Basic self-care  Mod overall  Toileting  Mod A   Bathroom Transfers CGA-Min A  Bowel/Bladder  Manage w/ mod assist  Transfers  MinA  Locomotion  MinA  Communication     Cognition     Pain  Faces scale less than 6  Safety/Judgment  Adhere to floor safety plan   Therapy Plan: PT Intensity: Minimum of 1-2 x/day ,45 to 90 minutes PT Frequency: 5 out of 7 days PT Duration Estimated Length of Stay: 18-21 days OT Intensity: Minimum of 1-2 x/day, 45 to 90 minutes OT Frequency: 5 out of 7 days OT Duration/Estimated Length of Stay: 21-23 days      Team Interventions: Nursing Interventions Patient/Family Education, Bladder Management, Bowel Management, Disease Management/Prevention, Pain  Management, Medication Management, Skin Care/Wound Management, Cognitive Remediation/Compensation, Dysphagia/Aspiration Precaution Training, Discharge Planning, Psychosocial Support  PT interventions Ambulation/gait training, Cognitive remediation/compensation, Discharge planning, DME/adaptive equipment instruction, Functional mobility training, Pain management, Psychosocial support, Splinting/orthotics, Therapeutic Activities, UE/LE Strength taining/ROM, Visual/perceptual remediation/compensation, Training and development officer, Community reintegration, Disease management/prevention, Functional electrical stimulation, Neuromuscular re-education, Patient/family education, Skin care/wound management, Stair training, Therapeutic Exercise, UE/LE Coordination activities, Wheelchair propulsion/positioning  OT Interventions Training and development officer, Discharge planning, Functional electrical stimulation, Self Care/advanced ADL retraining, Therapeutic Activities, UE/LE Coordination activities, Visual/perceptual remediation/compensation, Therapeutic Exercise, Skin care/wound managment, Patient/family education, Functional mobility training, Disease mangement/prevention, Cognitive remediation/compensation, Academic librarian, Engineer, drilling, Neuromuscular re-education, Psychosocial support, UE/LE Strength taining/ROM, Wheelchair propulsion/positioning, Splinting/orthotics, Pain management  SLP Interventions    TR Interventions    SW/CM Interventions Discharge Planning, Psychosocial Support, Patient/Family Education   Barriers to Discharge MD  Medical stability and ?bowel obstruction  Nursing      PT Decreased caregiver support, Other (comments) decreased physical support available, progressive nature of PD  OT      SLP      SW       Team Discharge Planning: Destination: PT-Home ,OT- Home , SLP-  Projected Follow-up: PT-Home health PT, 24 hour supervision/assistance, OT-  Home  health OT, SLP-  Projected Equipment Needs: PT-Wheelchair (measurements), OT- To be determined, SLP-  Equipment Details: PT-per patient, he has FWW at home, wc dimensions TBD, OT-pt has TTB, all other DME TBD Patient/family involved in discharge planning: PT- Patient,  OT-Patient, Family  member/caregiver, SLP-   MD ELOS: 20-24 days. Medical Rehab Prognosis:  Good Assessment: 74 yo male with history of HTN, hepatitis C who has had multiple falls at home over the last few months. Pt was seen by neurology and after extensive work up it was felt that he was suffering from potential Parkinson's Disease. Dr. Tomi Likens started him sinemet 09/13 with MRI C spine ordered for work up.  Pt was admitted on 09/18/19 after a fall at home where his head struck a Ecologist. 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 Ophthalmology was consulted and patient underwent surgical repair of the left eye by Dr. Eulas Post on 9/16. Pt was placed on broad spectrum antibiotics for coverage. To continue eye shield at all times and eye drops for one month post op.   Pt subsequently developed AKI, nephrology was consulted and vancomycin was discontinued. 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 Renal function has slowly improved, no HD necessary and nephrology following for input.  Abdominal ultrasound showed distended GB with intrahepatic biliary duct dilatation and suspicion of cirrhosis.  Elevated LFT being monitored and HIDA scan ordered due to issues with abdominal pain and concerns of acute cholecystitis-->negative for acute pathology and no surgical intervention needed.  Pt was screened by the inpatient rehab team, and it was felt that he could benefit from interdisciplinary rehab to address his balance and functional deficits related to his PD and recent fall/med-surg  complication. Will set goals for Min A with PT Mod A with OT.  Due to the current state of emergency, patients may not be receiving their 3-hours of Medicare-mandated therapy.  See Team Conference Notes for weekly updates to the plan of care

## 2019-09-30 NOTE — Discharge Summary (Signed)
Physician Discharge Summary  Nicholas Ramirez UXL:[id number] DOB: 1945/05/01 DOA: 09/18/2019  PCP: Townsend Roger, MD  Admit date: 09/18/2019 Discharge date: 09/30/2019  Admitted From: Home  Disposition:  CIR  Recommendations for Outpatient Follow-up:  1. Follow up with PCP in 1-2 weeks 2. Periodically check CMP/CBC 3. Doxycycline and Levaquin. For 3 more days  4. Continue eye drops for another 2 weeks 5. Dr. Eulas Post from ophthalmology to follow-up with the patient in the hospital tomorrow at the rehab.  Nephrology will continue following the patient as well while in the rehab. 6. Hold lisinopril and hydrochlorothiazide, resume when appropriate   Discharge Condition: Stable CODE STATUS: Full  Diet recommendation: Renal  Brief/Interim Summary: 74 year old male withHTN, heart murmur, cataracts, hepatitis C presented to [facility] a fall at home. Patient had multiple falls over the last few months at home. He has been evaluated at outpatient neurology (Dr Tomi Likens) and has had CT scan and MRI of his brain performed. He reported he has had generalized weakness with a slow unsteady gait. He was recently started on levodopa, carbidopa for possible Parkinson's disease by neurology.He fell on the dresser and has sustained a laceration to the side of his eye and hit his eye. He was unable to see and EMS was called. He reports that he had normal vision with his glasses in both eyes until this fall. He reports he cannot see out of the left eye after his fall. He was found to have hyphema and possible hematoma in left eye on CT scan.He denies any loss of consciousness with the fall. CT scan of his orbits shows probable rupture of anterior left globe. Ophthalmology has been consulted and He underwent surgical repair 9/16. ophthalmology recommended broad spectrum antibiotics. He has developed AKI , Nephrology consulted. Vancomycin discontinued, He has developed urinary retention, requiring foley catheter.  Renal functions are slowly improving. Continued on supportive care, No need for hemodialysis at this time.  Hydrochlorothiazide and lisinopril were held.  Propranolol was slowly resumed. During hospitalization had also had elevated total bilirubin and LFTs, had ultrasound and HIDA scan done which did not show any acute pathology.  Certainly no indication for cholecystectomy no further intervention at this time while undergoing other medical issues.  His LFTs can be followed outpatient and further determination can be made at that time. Discussed this with PMNR provider over the phone.  Stable for discharge to CIR.  Spoke with the patient's wife on the day of discharge today.  All the questions answered.   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  Status post surgical repair 9/16.  Eye shield at all times.  Antibiotics total of 14 days including IV.  Currently on Levaquin and doxycycline.  He will remain on eyedrops for total of 1 month postoperatively. Spoke with Dr. Eulas Post this morning, he will be seen patient tomorrow.  Appreciate his recommendations.  Essential hypertension -Lisinopril and HCTZ on hold.  Resume propranolol  Acute kidney injury with hyperkalemia -Suspect from ATN.  Creatinine improving.  Potassium is now normal.  Nephrology will continue to follow in CIR. -Supportive care.  Mechanical fall, recurrent falls at home, recent diagnosis of Parkinson's -PT/OT recommending CIR.  Per outpatient neurology recommendations Dr. Tomi Likens, now on Sinemet.  Continue this for now. 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 Moderate or severe neural foraminal stenosis at the bilateral C4, C5, and  C6 nerve levels.  Hyponatremia >>> Resolved -Resolved  Prolonged QT interval -Avoid QT prolonging meds.  Continue to  monitor  Constipation :Improving -Continue bowel regimen  Abnormal LFTS: US showed Edematous gallbladder wall thickening, no stones, sludge. -HIDA scan does not show any acute pathology that requires urgent intervention.  Continue to follow his LFTs as outpatient.  Abdomen is nontender and nondistended.  No evidence of acute infection.  Obesity Estimated body mass index is 38.19 kg/m as calculated from the following: Height as of this encounter: 5\' 9"  (1.753 m). Weight as of this encounter: 117.3 kg.  Coughing/dysphagia -Seen by speech recommending dysphagia 2 diet    Body mass index is 36.04 kg/m.         Discharge Diagnoses:  Principal Problem:   Ruptured globe of left eye Active Problems:   Essential hypertension   Fall at home, initial encounter   Hyponatremia   Prolonged QT interval      Consultations:  Nephrology  Subjective: Feels okay no complaints.  Discharge Exam: Vitals:   09/30/19 0552 09/30/19 1139  BP: (!) 168/98 (!) 147/98  Pulse: (!) 102 99  Resp: 16 18  Temp: 97.8 F (36.6 C) 97.7 F (36.5 C)  SpO2: 91% 94%   Vitals:   09/29/19 2129 09/30/19 0135 09/30/19 0552 09/30/19 1139  BP: (!) 127/91  (!) 168/98 (!) 147/98  Pulse: 99  (!) 102 99  Resp: 17  16 18   Temp: 98.4 F (36.9 C)  97.8 F (36.6 C) 97.7 F (36.5 C)  TempSrc:   Oral Oral  SpO2: 97%  91% 94%  Weight: 110.7 kg 110.7 kg    Height:        General: Pt is alert, awake, not in acute distress Cardiovascular: RRR, S1/S2 +, no rubs, no gallops Respiratory: CTA bilaterally, no wheezing, no rhonchi Abdominal: Soft, NT, ND, bowel sounds + Extremities: no edema, no cyanosis  Discharge Instructions   Allergies as of 09/30/2019      Reactions   Oxybutynin Other (See Comments)   cramps   Sertraline Itching   Tadalafil Rash      Medication List    STOP taking these medications   hydrochlorothiazide 25 MG tablet Commonly known as: HYDRODIURIL    lisinopril 40 MG tablet Commonly known as: ZESTRIL     TAKE these medications   aspirin 81 MG EC tablet Take 81 mg by mouth daily.   carbidopa-levodopa 25-100 MG tablet Commonly known as: SINEMET IR Take 1/2 tablet in AM and bedtime for one week, then 1/2 tablet three times daily for one week, then take 1 tablet three times daily.   cetirizine 10 MG tablet Commonly known as: ZYRTEC Take 10 mg by mouth daily.   doxycycline 100 MG tablet Commonly known as: VIBRA-TABS Take 1 tablet (100 mg total) by mouth every 12 (twelve) hours for 3 days.   DULoxetine 60 MG capsule Commonly known as: CYMBALTA Take 60 mg by mouth daily.   finasteride 5 MG tablet Commonly known as: PROSCAR Take 5 mg by mouth daily.   gatifloxacin 0.5 % Soln Commonly known as: ZYMAXID Place 1 drop into the left eye 4 (four) times daily for 15 days.   levofloxacin 500 MG tablet Commonly known as: LEVAQUIN Take 1 tablet (500 mg total) by mouth every other day for 3 days. Start taking on: October 01, 2019   Myrbetriq 50 MG Tb24 tablet Generic drug: mirabegron ER Take 50 mg by mouth daily.   neomycin-polymyxin b-dexamethasone  3.5-10000-0.1 Oint Commonly known as: MAXITROL Place 1 application into the left eye 4 (four) times daily for 15 days.   nystatin 100000 UNIT/ML suspension Commonly known as: MYCOSTATIN Take 5 mLs (500,000 Units total) by mouth 4 (four) times daily for 7 days.   prednisoLONE acetate 1 % ophthalmic suspension Commonly known as: PRED FORTE Place 1 drop into the left eye 4 (four) times daily for 15 days.   propranolol 40 MG tablet Commonly known as: INDERAL Take 40 mg by mouth 2 (two) times daily.       Allergies  Allergen Reactions  . Oxybutynin Other (See Comments)    cramps  . Sertraline Itching  . Tadalafil Rash    You were cared for by a hospitalist during your hospital stay. If you have any questions about your discharge medications or the care you received  while you were in the hospital after you are discharged, you can call the unit and asked to speak with the hospitalist on call if the hospitalist that took care of you is not available. Once you are discharged, your primary care physician will handle any further medical issues. Please note that no refills for any discharge medications will be authorized once you are discharged, as it is imperative that you return to your primary care physician (or establish a relationship with a primary care physician if you do not have one) for your aftercare needs so that they can reassess your need for medications and monitor your lab values.   Procedures/Studies: CT Head Wo Contrast  Result Date: 09/18/2019 CLINICAL DATA:  Fall with left orbital trauma EXAM: CT HEAD AND ORBITS WITHOUT CONTRAST TECHNIQUE: Contiguous axial images were obtained from the base of the skull through the vertex without contrast. Multidetector CT imaging of the orbits was performed using the standard protocol without intravenous contrast. COMPARISON:  None. FINDINGS: CT HEAD FINDINGS Brain: No evidence of acute infarction, hemorrhage, hydrocephalus, extra-axial collection or mass lesion/mass effect. Vascular: No hyperdense vessel or unexpected calcification. Skull: Normal. Negative for fracture or focal lesion. Other: None. CT ORBITS FINDINGS Orbits: There is heterogeneous density material throughout all chambers of the left lobe. There is suspected globe disruption along the anterior aspect best seen series 10, image 63. The left optic nerve and extraocular muscles are normal. The right orbit is normal. Visualized sinuses: Clear. Soft tissues: Mild left periorbital soft tissue swelling. IMPRESSION: 1. No acute intracranial abnormality. 2. Suspected globe disruption along the anterior aspect of the left globe with intra-ocular hemorrhage. 3. Mild left periorbital soft tissue swelling. Electronically Signed   By: Ulyses Jarred M.D.   On: 09/18/2019  19:46   MR CERVICAL SPINE WO CONTRAST  Result Date: 09/20/2019 CLINICAL DATA:  74 year old male with multiple falls. Left upper extremity weakness, muscle spasm. Myelopathy. EXAM: MRI CERVICAL SPINE WITHOUT CONTRAST TECHNIQUE: Multiplanar, multisequence MR imaging of the cervical spine was performed. No intravenous contrast was administered. COMPARISON:  Neck MRA 07/02/2019.  CT head and orbits 09/18/2019. FINDINGS: Alignment: Mild straightening of cervical lordosis. Mild degenerative appearing retrolisthesis at from C4 on C5-C6 on C7. Vertebrae: No marrow edema or evidence of acute osseous abnormality. Degenerative endplate marrow signal changes. Cord: Capacious spinal canal at most levels. Cervical and visible upper thoracic spinal cord remain within normal limits. Posterior Fossa, vertebral arteries, paraspinal tissues: Cervicomedullary junction is within normal limits. Negative visible posterior fossa. Preserved major vascular flow voids in the neck. Negative visible neck soft tissues. Disc levels: C2-C3:  Negative. C3-C4: Disc space  loss. Mostly foraminal disc bulge and endplate spurring. No spinal stenosis. Moderate bilateral C4 foraminal stenosis. C4-C5: Mild retrolisthesis and disc space loss. Circumferential disc osteophyte complex and mild posterior element hypertrophy. No significant spinal stenosis. Moderate to severe left and moderate right C5 foraminal stenosis. C5-C6: Mild retrolisthesis with disc space loss and circumferential disc osteophyte complex. Mild posterior element hypertrophy. No significant spinal stenosis. Moderate to severe bilateral C6 foraminal stenosis. C6-C7: Mild retrolisthesis. Disc space loss. Mild circumferential disc osteophyte complex. Mild posterior element hypertrophy. No spinal stenosis. Mild to moderate C7 foraminal stenosis greater on the right. C7-T1: Negative disc. Mild to moderate facet hypertrophy. No stenosis. No visible upper thoracic spinal stenosis. There is  bilateral upper thoracic facet hypertrophy. 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 But capacious spinal canal with no significant spinal stenosis. Normal cervical spinal cord. 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 Electronically Signed   By: Genevie Ann M.D.   On: 09/20/2019 19:25   NM Hepatobiliary Liver Func  Result Date: 09/30/2019 CLINICAL DATA:  Chronic upper abdominal pain, gallbladder wall thickening on ultrasound, assess gallbladder motility EXAM: NUCLEAR MEDICINE HEPATOBILIARY IMAGING TECHNIQUE: Sequential images of the abdomen were obtained out to 60 minutes following intravenous administration of radiopharmaceutical. Due to nonvisualization of the gallbladder, 3 mg of morphine was administered and imaging was continued for an additional 30 minutes. RADIOPHARMACEUTICALS:  5.3 mCi Tc-28m  Choletec IV COMPARISON:  Ultrasound abdomen 09/27/2019 FINDINGS: Delayed clearance of tracer from bloodstream indicating impaired hepatocellular function. Abnormal appearance of liver with inferior position and discontiguous appearance of the lateral segment LEFT lobe versus remainder of liver, question hepatic anomaly. Significant retention of tracer within hepatic parenchyma as well as within dilated central biliary tree of the lateral segment LEFT lobe. Delayed visualization of CBD. No definite bowel activity is identified by the conclusion of the exam. Gallbladder is probably visualized just before morphine administration with increased tracer accumulation in probable gallbladder following morphine. 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 Visualization of central biliary radicles, probably gallbladder, and CBD. No small bowel activity is identified to confirm CBD patency; in light of the biliary dilatation identified by  ultrasound, recommend assessment by MR imaging with MRCP imaging to evaluate. If patient is unable to undergo MR imaging, recommend CT assessment, with IV contrast if renal function permits. Electronically Signed   By: Lavonia Dana M.D.   On: 09/30/2019 14:25   US RENAL  Result Date: 09/20/2019 CLINICAL DATA:  Acute renal injury. EXAM: RENAL / URINARY TRACT ULTRASOUND COMPLETE COMPARISON:  None. FINDINGS: Right Kidney: Renal measurements: 10.4 cm x 4.4 cm x 5.1 cm = volume: 122.1 mL. Echogenicity within normal limits. No mass or hydronephrosis visualized. Left Kidney: Renal measurements: 11.4 cm x 6.6 cm x 5.8 cm = volume: 225.9 mL. Echogenicity within normal limits. No mass or hydronephrosis visualized. Bladder: Appears normal for degree of bladder distention. Other: None. IMPRESSION: Normal renal ultrasound. Electronically Signed   By: Virgina Norfolk M.D.   On: 09/20/2019 18:04   CT Orbits Wo Contrast  Result Date: 09/18/2019 CLINICAL DATA:  Fall with left orbital trauma EXAM: CT HEAD AND ORBITS WITHOUT CONTRAST TECHNIQUE: Contiguous axial images were obtained from the base of the skull through the vertex without contrast. Multidetector CT imaging of the orbits was performed using the standard protocol without intravenous contrast. COMPARISON:  None. FINDINGS: CT HEAD FINDINGS Brain: No  evidence of acute infarction, hemorrhage, hydrocephalus, extra-axial collection or mass lesion/mass effect. Vascular: No hyperdense vessel or unexpected calcification. Skull: Normal. Negative for fracture or focal lesion. Other: None. CT ORBITS FINDINGS Orbits: There is heterogeneous density material throughout all chambers of the left lobe. There is suspected globe disruption along the anterior aspect best seen series 10, image 63. The left optic nerve and extraocular muscles are normal. The right orbit is normal. Visualized sinuses: Clear. Soft tissues: Mild left periorbital soft tissue swelling. IMPRESSION: 1. No acute  intracranial abnormality. 2. Suspected globe disruption along the anterior aspect of the left globe with intra-ocular hemorrhage. 3. Mild left periorbital soft tissue swelling. Electronically Signed   By: Ulyses Jarred M.D.   On: 09/18/2019 19:46   US Abdomen Limited RUQ  Result Date: 09/27/2019 CLINICAL DATA:  Elevated liver enzymes. EXAM: ULTRASOUND ABDOMEN LIMITED RIGHT UPPER QUADRANT COMPARISON:  Abdominal ultrasound 12/18/2014 FINDINGS: Gallbladder: Distended with edematous gallbladder wall thickening measuring up to 6 mm. No shadowing gallstone. No intraluminal sludge. No sonographic Murphy sign noted by sonographer. Common bile duct: Diameter: 7 mm, upper normal for age. Liver: There is intrahepatic biliary ductal dilatation. Hepatic parenchyma is heterogeneous, and mildly increased. There is capsular nodularity. Liver lesions on prior ultrasound are not well seen on the current exam. Portal vein is patent on color Doppler imaging with normal direction of blood flow towards the liver. Other: Small volume right upper quadrant ascites and pericholecystic fluid. IMPRESSION: 1. Distended gallbladder with edematous gallbladder wall thickening. No gallstones or sludge. Findings may be related to underlying chronic liver disease with ascites in the right upper quadrant. If there is clinical concern for acute cholecystitis, recommend further evaluation with nuclear medicine HIDA scan. 2. Intrahepatic biliary ductal dilatation with upper normal common bile duct. 3. Heterogeneous hepatic parenchyma with increased echogenicity and suggestion of capsular nodularity. Findings suspicious for cirrhosis. Liver lesions on 2016 ultrasound are not well seen on the current exam. Electronically Signed   By: Keith Rake M.D.   On: 09/27/2019 21:42      The results of significant diagnostics from this hospitalization (including imaging, microbiology, ancillary and laboratory) are listed below for reference.      Microbiology: No results found for this or any previous visit (from the past 240 hour(s)).   Labs: BNP (last 3 results) No results for input(s): BNP in the last 8760 hours. Basic Metabolic Panel: Recent Labs  Lab 09/24/19 0247 09/24/19 0247 09/25/19 0618 09/25/19 0618 09/26/19 1045 09/27/19 1050 09/28/19 0354 09/29/19 1001 09/30/19 0429  NA 133*   < > 136   < > 137 137 139 143 141  K 4.7   < > 4.5   < > 5.0 5.3* 6.0* 5.1 5.1  CL 100   < > 106   < > 107 106 107 110 108  CO2 20*   < > 20*   < > 19* 22 24 24 24   GLUCOSE 132*   < > 128*   < > 170* 150* 120* 128* 118*  BUN 75*   < > 82*   < > 89* 88* 86* 78* 82*  CREATININE 4.92*   < > 4.28*   < > 4.04* 3.51* 3.29* 2.95* 2.91*  CALCIUM 8.3*   < > 8.3*   < > 8.3* 8.1* 8.1* 8.4* 8.4*  MG 2.4  --  2.3  --  2.2 2.1  --  1.9  --   PHOS 6.3*  --  5.6*  --  6.0* 5.4*  --  5.3*  --    < > = values in this interval not displayed.   Liver Function Tests: Recent Labs  Lab 09/26/19 1045 09/27/19 1050 09/28/19 0354 09/29/19 1001 09/30/19 0429  AST 127* 109* 140* 112* 109*  ALT 45* 41 70* 102* 96*  ALKPHOS 166* 130* 134* 151* 140*  BILITOT 5.6* 5.1* 5.3* 5.2* 5.2*  PROT 7.5 6.3* 6.1* 7.4 6.6  ALBUMIN 1.8* 1.6* 1.6* 1.8* 1.8*   No results for input(s): LIPASE, AMYLASE in the last 168 hours. No results for input(s): AMMONIA in the last 168 hours. CBC: Recent Labs  Lab 09/24/19 0247 09/24/19 0247 09/25/19 0618 09/26/19 1045 09/27/19 1050 09/28/19 0821 09/29/19 1001  WBC 8.3  --   --   --  8.5  --  11.2*  HGB 12.2*   < > 11.9* 12.9* 12.0* 12.6* 13.5  HCT 34.2*   < > 32.9* 37.2* 33.8* 36.1* 39.8  MCV 95.5  --   --   --  98.8  --  101.0*  PLT 113*  --   --   --  94*  --  96*   < > = values in this interval not displayed.   Cardiac Enzymes: No results for input(s): CKTOTAL, CKMB, CKMBINDEX, TROPONINI in the last 168 hours. BNP: Invalid input(s): POCBNP CBG: No results for input(s): GLUCAP in the last 168  hours. D-Dimer No results for input(s): DDIMER in the last 72 hours. Hgb A1c No results for input(s): HGBA1C in the last 72 hours. Lipid Profile No results for input(s): CHOL, HDL, LDLCALC, TRIG, CHOLHDL, LDLDIRECT in the last 72 hours. Thyroid function studies No results for input(s): TSH, T4TOTAL, T3FREE, THYROIDAB in the last 72 hours.  Invalid input(s): FREET3 Anemia work up No results for input(s): VITAMINB12, FOLATE, FERRITIN, TIBC, IRON, RETICCTPCT in the last 72 hours. Urinalysis    Component Value Date/Time   COLORURINE YELLOW 09/22/2019 0625   APPEARANCEUR HAZY (A) 09/22/2019 0625   LABSPEC 1.006 09/22/2019 0625   PHURINE 5.0 09/22/2019 0625   GLUCOSEU NEGATIVE 09/22/2019 0625   HGBUR SMALL (A) 09/22/2019 0625   BILIRUBINUR NEGATIVE 09/22/2019 0625   KETONESUR NEGATIVE 09/22/2019 0625   PROTEINUR NEGATIVE 09/22/2019 0625   NITRITE NEGATIVE 09/22/2019 0625   LEUKOCYTESUR NEGATIVE 09/22/2019 0625   Sepsis Labs Invalid input(s): PROCALCITONIN,  WBC,  LACTICIDVEN Microbiology No results found for this or any previous visit (from the past 240 hour(s)).   Time coordinating discharge:  I have spent 35 minutes face to face with the patient and on the ward discussing the patients care, assessment, plan and disposition with other care givers. >50% of the time was devoted counseling the patient about the risks and benefits of treatment/Discharge disposition and coordinating care.   SIGNED:   Damita Lack, MD  [facility] 09/30/2019, 3:21 PM   If 7PM-7AM, please contact night-coverage

## 2019-10-01 ENCOUNTER — Inpatient Hospital Stay (HOSPITAL_COMMUNITY): Payer: Medicare Other

## 2019-10-01 ENCOUNTER — Inpatient Hospital Stay (HOSPITAL_COMMUNITY): Payer: Medicare Other | Admitting: Occupational Therapy

## 2019-10-01 DIAGNOSIS — R1310 Dysphagia, unspecified: Secondary | ICD-10-CM

## 2019-10-01 DIAGNOSIS — E876 Hypokalemia: Secondary | ICD-10-CM

## 2019-10-01 DIAGNOSIS — I10 Essential (primary) hypertension: Secondary | ICD-10-CM

## 2019-10-01 DIAGNOSIS — E875 Hyperkalemia: Secondary | ICD-10-CM

## 2019-10-01 DIAGNOSIS — N179 Acute kidney failure, unspecified: Secondary | ICD-10-CM

## 2019-10-01 DIAGNOSIS — R5381 Other malaise: Principal | ICD-10-CM

## 2019-10-01 DIAGNOSIS — R7401 Elevation of levels of liver transaminase levels: Secondary | ICD-10-CM

## 2019-10-01 DIAGNOSIS — E8809 Other disorders of plasma-protein metabolism, not elsewhere classified: Secondary | ICD-10-CM

## 2019-10-01 DIAGNOSIS — R140 Abdominal distension (gaseous): Secondary | ICD-10-CM

## 2019-10-01 DIAGNOSIS — E46 Unspecified protein-calorie malnutrition: Secondary | ICD-10-CM

## 2019-10-01 LAB — CBC WITH DIFFERENTIAL/PLATELET
Abs Immature Granulocytes: 0.08 10*3/uL — ABNORMAL HIGH (ref 0.00–0.07)
Basophils Absolute: 0 10*3/uL (ref 0.0–0.1)
Basophils Relative: 0 %
Eosinophils Absolute: 0 10*3/uL (ref 0.0–0.5)
Eosinophils Relative: 0 %
HCT: 38.6 % — ABNORMAL LOW (ref 39.0–52.0)
Hemoglobin: 13.2 g/dL (ref 13.0–17.0)
Immature Granulocytes: 1 %
Lymphocytes Relative: 9 %
Lymphs Abs: 0.9 10*3/uL (ref 0.7–4.0)
MCH: 34.8 pg — ABNORMAL HIGH (ref 26.0–34.0)
MCHC: 34.2 g/dL (ref 30.0–36.0)
MCV: 101.8 fL — ABNORMAL HIGH (ref 80.0–100.0)
Monocytes Absolute: 0.6 10*3/uL (ref 0.1–1.0)
Monocytes Relative: 6 %
Neutro Abs: 8 10*3/uL — ABNORMAL HIGH (ref 1.7–7.7)
Neutrophils Relative %: 84 %
Platelets: 73 10*3/uL — ABNORMAL LOW (ref 150–400)
RBC: 3.79 MIL/uL — ABNORMAL LOW (ref 4.22–5.81)
RDW: 18.2 % — ABNORMAL HIGH (ref 11.5–15.5)
WBC: 9.6 10*3/uL (ref 4.0–10.5)
nRBC: 0 % (ref 0.0–0.2)

## 2019-10-01 LAB — COMPREHENSIVE METABOLIC PANEL
ALT: 97 U/L — ABNORMAL HIGH (ref 0–44)
AST: 106 U/L — ABNORMAL HIGH (ref 15–41)
Albumin: 1.7 g/dL — ABNORMAL LOW (ref 3.5–5.0)
Alkaline Phosphatase: 137 U/L — ABNORMAL HIGH (ref 38–126)
Anion gap: 10 (ref 5–15)
BUN: 86 mg/dL — ABNORMAL HIGH (ref 8–23)
CO2: 26 mmol/L (ref 22–32)
Calcium: 8.5 mg/dL — ABNORMAL LOW (ref 8.9–10.3)
Chloride: 108 mmol/L (ref 98–111)
Creatinine, Ser: 2.98 mg/dL — ABNORMAL HIGH (ref 0.61–1.24)
GFR calc Af Amer: 23 mL/min — ABNORMAL LOW (ref >60)
GFR calc non Af Amer: 20 mL/min — ABNORMAL LOW (ref >60)
Glucose, Bld: 132 mg/dL — ABNORMAL HIGH (ref 70–99)
Potassium: 5 mmol/L (ref 3.5–5.1)
Sodium: 144 mmol/L (ref 135–145)
Total Bilirubin: 5.1 mg/dL — ABNORMAL HIGH (ref 0.3–1.2)
Total Protein: 6.4 g/dL — ABNORMAL LOW (ref 6.5–8.1)

## 2019-10-01 IMAGING — DX DG ABDOMEN 1V
2 series · 2 of 2 positions shown · non-contrast
Comparison: None.

CLINICAL DATA: Abdomen distension

EXAM:
ABDOMEN - 1 VIEW

[abdomen kub (1 of 2)]
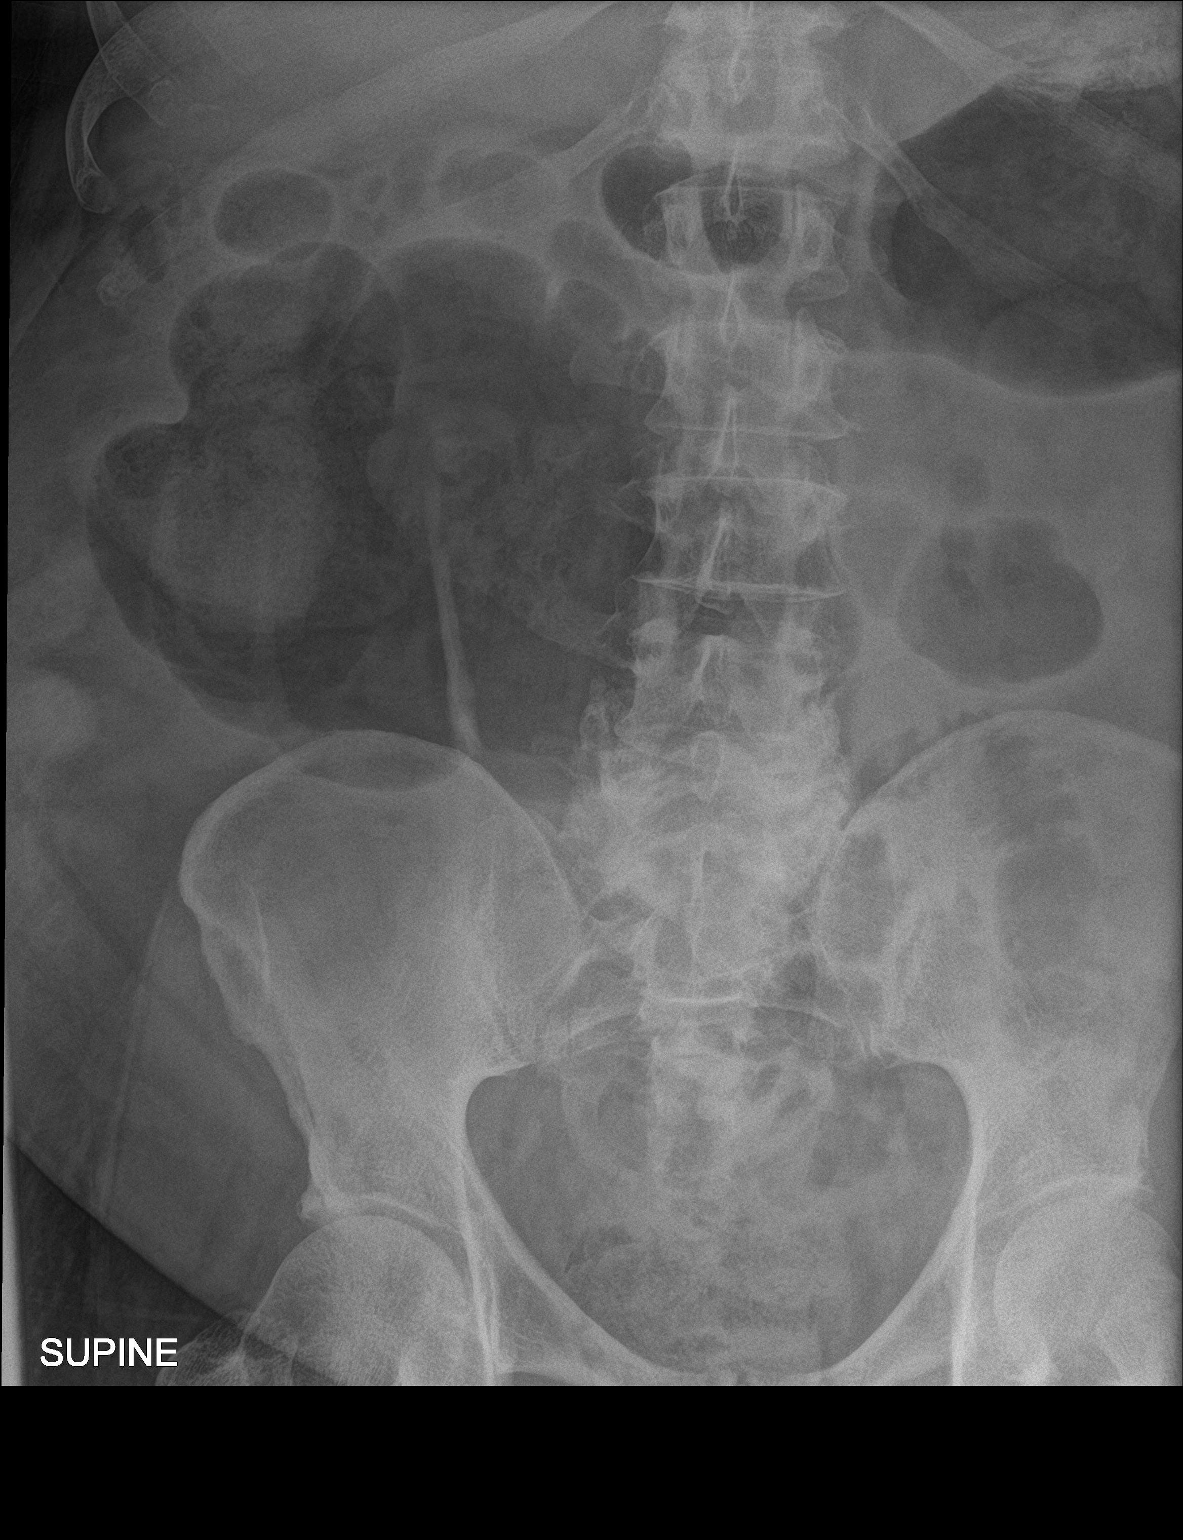

[abdomen kub (2 of 2)]
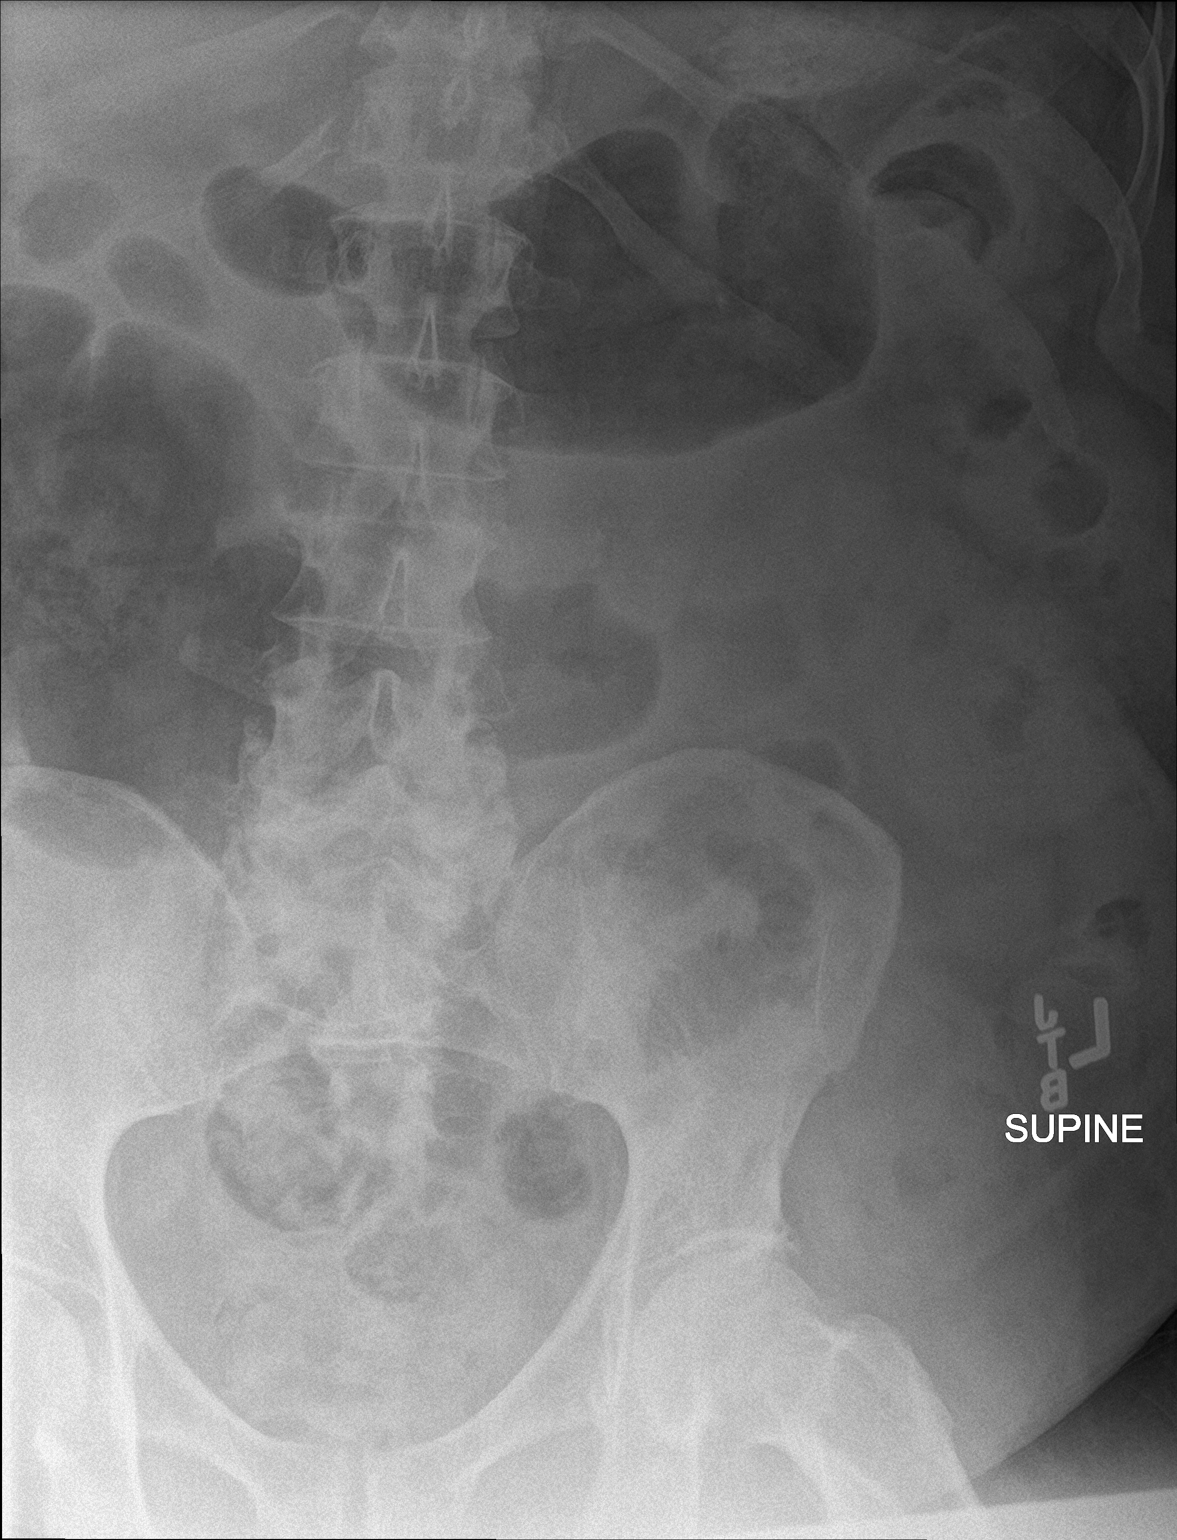

[2 of 2 positions shown; findings below may reference images not displayed]

FINDINGS: Air distension of what appears to be cecum in the right mid abdomen
up to 14.8 cm. Remainder of the gas pattern is unremarkable. No
radiopaque calculi are visualized.
IMPRESSION: Air distension of what appears to be cecum in the right mid abdomen
up to 14.8 cm, raising possibility of cecal bascule/cecal
obstruction. CT may be considered for further evaluation.

These results will be called to the ordering clinician or
representative by the Radiologist Assistant, and communication
documented in the PACS or [REDACTED].

## 2019-10-01 MED ORDER — PROSOURCE PLUS PO LIQD
30.0000 mL | Freq: Two times a day (BID) | ORAL | Status: DC
  Administered 2019-10-01 – 2019-10-02 (×2): 30 mL via ORAL
  Filled 2019-10-01: qty 30

## 2019-10-01 MED ORDER — HYDROCORTISONE NA SUCCINATE PF 100 MG IJ SOLR
50.0000 mg | Freq: Three times a day (TID) | INTRAMUSCULAR | Status: AC
  Administered 2019-10-01 – 2019-10-03 (×6): 50 mg via INTRAVENOUS
  Filled 2019-10-01 (×6): qty 1

## 2019-10-01 MED ORDER — HYDROCORTISONE NA SUCCINATE PF 100 MG IJ SOLR: 12.5000 mg | Freq: Three times a day (TID) | INTRAMUSCULAR | Status: DC

## 2019-10-01 MED ORDER — HYDROCORTISONE NA SUCCINATE PF 100 MG IJ SOLR
12.5000 mg | Freq: Three times a day (TID) | INTRAMUSCULAR | Status: AC
  Administered 2019-10-03 – 2019-10-05 (×6): 12.5 mg via INTRAVENOUS
  Filled 2019-10-01 (×7): qty 0.25

## 2019-10-01 MED ORDER — HYDROCORTISONE NA SUCCINATE PF 100 MG IJ SOLR: 50.0000 mg | Freq: Three times a day (TID) | INTRAMUSCULAR | Status: DC

## 2019-10-01 MED ORDER — ENSURE ENLIVE PO LIQD
237.0000 mL | Freq: Two times a day (BID) | ORAL | Status: DC
  Administered 2019-10-02: 237 mL via ORAL

## 2019-10-01 NOTE — Progress Notes (Signed)
Patient ID: Nicholas Ramirez, male   DOB: 12/24/1945, 74 y.o.   MRN: [id number] [facility] Progress Note   Assessment/ Plan:   1. Acute kidney Injury: nonoliguric and with seemingly plateaued renal function overnight.  Etiology of acute kidney injury is thought to be multifactorial but predominantly hemodynamically mediated by hypotension possibly with evolution to ATN in the setting of ongoing HCTZ/ACE inhibitor and transient urine retention.  Renal function relatively unchanged overnight with decent urine output-we will discontinue fleets enema and aluminum-based antacids for renal precautions.  He remains hypervolemic and I plan to restart his furosemide with consistent downtrend of creatinine. 2.  Hyperkalemia: Mild, status post Lokelma earlier today. 3.  Hypertension: Blood pressures remain elevated off antihypertensive therapy; started on low-dose carvedilol 4.  Ruptured globe left eye: Secondary to mechanical fall/trauma and status post surgical repair.  On antibiotics-levofloxacin and doxycycline. 5.  Recurrent falls/recent diagnosis of Parkinson disease.  Admitted to CIR for ongoing rehabilitation efforts. 6.  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  CBD patency could not be confirmed and additional evaluation recommended.  Subjective:   Without acute events overnight following transfer to CIR.  Denies any chest pain or shortness of breath.   Objective:   BP (!) 142/93 (BP Location: Left Arm)   Pulse 93   Temp 97.7 F (36.5 C) (Oral)   Resp 20   Wt 107 kg   SpO2 97%   BMI 34.85 kg/m   Intake/Output Summary (Last 24 hours) at 10/01/2019 1039 Last data filed at 10/01/2019 0715 Gross per 24 hour  Intake 300 ml  Output 1275 ml  Net -975 ml   Weight change:   Physical Exam: Gen: Sitting up in a  wheelchair working with PT/OT.  Wearing shield over left eye. CVS: Pulse regular tachycardia, S1 and S2 normal Resp: Poor inspiratory effort with decreased breath sounds over bases, no distinct rales Abd: Soft, obese, nontender Ext: 2+-3+ lower extremity edema.  Imaging: NM Hepatobiliary Liver Func  Result Date: 09/30/2019 CLINICAL DATA:  Chronic upper abdominal pain, gallbladder wall thickening on ultrasound, assess gallbladder motility EXAM: NUCLEAR MEDICINE HEPATOBILIARY IMAGING TECHNIQUE: Sequential images of the abdomen were obtained out to 60 minutes following intravenous administration of radiopharmaceutical. Due to nonvisualization of the gallbladder, 3 mg of morphine was administered and imaging was continued for an additional 30 minutes. RADIOPHARMACEUTICALS:  5.3 mCi Tc-22m  Choletec IV COMPARISON:  Ultrasound abdomen 09/27/2019 FINDINGS: Delayed clearance of tracer from bloodstream indicating impaired hepatocellular function. Abnormal appearance of liver with inferior position and discontiguous appearance of the lateral segment LEFT lobe versus remainder of liver, question hepatic anomaly. Significant retention of tracer within hepatic parenchyma as well as within dilated central biliary tree of the lateral segment LEFT lobe. Delayed visualization of CBD. No definite bowel activity is identified by the conclusion of the exam. Gallbladder is probably visualized just before morphine administration with increased tracer accumulation in probable gallbladder following morphine. 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 Visualization of central biliary radicles, probably gallbladder, and CBD. No small bowel activity is identified to confirm CBD patency; in light of the biliary dilatation identified by ultrasound, recommend assessment by MR imaging with MRCP imaging to evaluate. If patient is unable to undergo  MR imaging, recommend CT assessment, with IV contrast if renal function permits. Electronically Signed  By: Lavonia Dana M.D.   On: 09/30/2019 14:25    Labs: BMET Recent Labs  Lab 09/25/19 0618 09/26/19 1045 09/27/19 1050 09/28/19 0354 09/29/19 1001 09/30/19 0429 10/01/19 0452  NA 136 137 137 139 143 141 144  K 4.5 5.0 5.3* 6.0* 5.1 5.1 5.0  CL 106 107 106 107 110 108 108  CO2 20* 19* 22 24 24 24 26   GLUCOSE 128* 170* 150* 120* 128* 118* 132*  BUN 82* 89* 88* 86* 78* 82* 86*  CREATININE 4.28* 4.04* 3.51* 3.29* 2.95* 2.91* 2.98*  CALCIUM 8.3* 8.3* 8.1* 8.1* 8.4* 8.4* 8.5*  PHOS 5.6* 6.0* 5.4*  --  5.3*  --   --    CBC Recent Labs  Lab 09/27/19 1050 09/28/19 0821 09/29/19 1001 10/01/19 0452  WBC 8.5  --  11.2* 9.6  NEUTROABS  --   --   --  8.0*  HGB 12.0* 12.6* 13.5 13.2  HCT 33.8* 36.1* 39.8 38.6*  MCV 98.8  --  101.0* 101.8*  PLT 94*  --  96* 73*    Medications:    . (feeding supplement) PROSource Plus  30 mL Oral BID BM  . carvedilol  3.125 mg Oral BID WC  . doxycycline  100 mg Oral Q12H  . DULoxetine  60 mg Oral Daily  . finasteride  5 mg Oral Daily  . gatifloxacin  1 drop Left Eye QID  . hydrocortisone sod succinate (SOLU-CORTEF) inj  50 mg Intravenous Q8H   Followed by  . [START ON 10/03/2019] hydrocortisone sod succinate (SOLU-CORTEF) inj  12.5 mg Intravenous Q8H  . [START ON 10/02/2019] levofloxacin  500 mg Oral Q48H  . loratadine  10 mg Oral Daily  . mirabegron ER  50 mg Oral Daily  . neomycin-polymyxin b-dexamethasone  1 application Left Eye QID  . nystatin  5 mL Oral QID  . polyethylene glycol  17 g Oral BID  . prednisoLONE acetate  1 drop Left Eye QID  . senna-docusate  1 tablet Oral BID  . sodium zirconium cyclosilicate  10 g Oral Daily   Elmarie Shiley, MD 10/01/2019, 10:39 AM

## 2019-10-01 NOTE — Evaluation (Signed)
Physical Therapy Assessment and Plan  Patient Details  Name: Nicholas Ramirez MRN: [id number] Date of Birth: [birth year]  PT Diagnosis: Abnormal posture, Abnormality of gait, Coordination disorder, Difficulty walking, Hypertonia, Impaired cognition, Muscle spasms, Muscle weakness and Pain in L eye Rehab Potential: Fair ELOS: 18-21 days   Today's Date: 10/01/2019 PT Individual Time: 0800-0902 PT Individual Time Calculation (min): 62 min    Hospital Problem: Principal Problem:   Parkinson's disease ([place]) Active Problems:   Debility   Past Medical History:  Past Medical History:  Diagnosis Date  . Asthma   . Depression   . Heart murmur   . Hepatitis C   . Hypertension    Past Surgical History:  Past Surgical History:  Procedure Laterality Date  . CARPAL TUNNEL RELEASE Left   . RUPTURED GLOBE EXPLORATION AND REPAIR Left 09/18/2019   Procedure: REPAIR OF RUPTURED GLOBE;  Surgeon: Lonia Skinner, MD;  Location: [facility];  Service: Ophthalmology;  Laterality: Left;    Assessment & Plan Clinical Impression: Patient is a 74 y.o. year old male with history of HTN, hepatitis C who has had multiple falls at home over the last few months. Pt was seen by neurology and after extensive work up it was felt that he was suffering from potential Parkinson's Disease. Dr. Tomi Likens started him sinemet09/13 with MRI C spine ordered for work up.Pt was admitted on 09/18/19 after a fall at home where his head struck a Ecologist. He was unable to see after the fall CT of eyes/head revealed hyphema, ruptured globe left eye with open globe, uveal prolapse with loss of tissue and no light preception.Ophthalmology was consulted and patient underwent surgical repair of the left eye by Dr. Eulas Post on 9/16. Pt was placed on broad spectrum antibiotics for coverage.To continue eye shield at all times and eye drops for one month post op.  Pt subsequently developed AKI, nephrology was consulted and vancomycin was  discontinued.AKI felt to be multifactorial with UOP improving post foley placement for BOO.Renal function has slowly improved, no HD necessaryand nephrology following for input.Abdominal ultrasound showed distended GB with intrahepatic biliary duct dilatation and suspicion of cirrhosis.Elevated LFT being monitored and HIDA scan ordered due to issues with abdominal pain and concerns of acute cholecystitis-->negative for acute pathology and no surgical intervention needed.Pt was screened by the inpatient rehab team, and it was felt that he could benefit from interdisciplinary rehab to address his balance and functional deficits related to his PD and recent fall/med-surg complications. .   Patient currently requires max with mobility secondary to muscle weakness and muscle joint tightness, decreased cardiorespiratoy endurance, impaired timing and sequencing, abnormal tone, unbalanced muscle activation, decreased coordination and decreased motor planning, decreased visual acuity and field cut, decreased motor planning, decreased attention, decreased awareness and decreased memory and decreased sitting balance, decreased standing balance, decreased postural control and decreased balance strategies.  Prior to hospitalization, patient was min with mobility and lived with Spouse (dtr lives locally too) in a House home.  Home access is  Level entry.  Patient will benefit from skilled PT intervention to maximize safe functional mobility, minimize fall risk and decrease caregiver burden for planned discharge home with 24 hour assist.  Anticipate patient will benefit from follow up [facility] at discharge.  PT - End of Session Activity Tolerance: Tolerates 30+ min activity with multiple rests Endurance Deficit: Yes Endurance Deficit Description: multiple frequent rest breaks required PT Assessment Rehab Potential (ACUTE/IP ONLY): Fair PT Barriers to Discharge: Decreased caregiver support;Other (comments)  PT  Barriers to Discharge Comments: decreased physical support available, progressive nature of PD PT Patient demonstrates impairments in the following area(s): Balance;Edema;Endurance;Motor;Pain;Perception;Safety;Sensory;Skin Integrity PT Transfers Functional Problem(s): Bed Mobility;Bed to Chair;Car;Furniture PT Locomotion Functional Problem(s): Ambulation;Wheelchair Mobility;Stairs PT Plan PT Intensity: Minimum of 1-2 x/day ,45 to 90 minutes PT Frequency: 5 out of 7 days PT Duration Estimated Length of Stay: 18-21 days PT Treatment/Interventions: Ambulation/gait training;Cognitive remediation/compensation;Discharge planning;DME/adaptive equipment instruction;Functional mobility training;Pain management;Psychosocial support;Splinting/orthotics;Therapeutic Activities;UE/LE Strength taining/ROM;Visual/perceptual remediation/compensation;Balance/vestibular training;Community reintegration;Disease management/prevention;Functional electrical stimulation;Neuromuscular re-education;Patient/family education;Skin care/wound management;Stair training;Therapeutic Exercise;UE/LE Coordination activities;Wheelchair propulsion/positioning PT Transfers Anticipated Outcome(s): MinA PT Locomotion Anticipated Outcome(s): MinA PT Recommendation Recommendations for Other Services: Neuropsych consult;Speech consult Follow Up Recommendations: Home health PT;24 hour supervision/assistance Patient destination: Home Equipment Recommended: Wheelchair (measurements) Equipment Details: per patient, he has FWW at home, wc dimensions TBD   PT Evaluation Precautions/Restrictions Precautions Precautions: Fall;Other (comment) (L eye shield on at all times) Precaution Comments: pt admitted for a fall, daughter reports 15-20 in last 6 months Restrictions Weight Bearing Restrictions: No General Chart Reviewed: Yes Family/Caregiver Present: No  Pain Pain Assessment Pain Scale: 0-10 Pain Score: 8  Pain Type: Acute  pain;Surgical pain Pain Location: Eye Pain Orientation: Left Pain Descriptors / Indicators: Aching Pain Onset: On-going Patients Stated Pain Goal: 0 Pain Intervention(s): Medication (See eMAR);Relaxation Multiple Pain Sites: No Home Living/Prior Functioning Home Living Available Help at Discharge: Available 24 hours/day Type of Home: House Home Access: Level entry Home Layout: One level Bathroom Shower/Tub: Chiropodist: Standard Bathroom Accessibility: No Additional Comments: no AD or grab bars. pt unable to grasp with LUE  Lives With: Spouse (dtr lives locally too) Prior Function Level of Independence: Needs assistance with ADLs;Needs assistance with gait;Needs assistance with tranfers (wife would hold onto patient, no AD used)  Able to Take Stairs?: No Driving: No Vocation: Retired Leisure: Hobbies-yes (Comment) (fishing, go to [employer]) Comments: high frequency of falls, unable to grip or use LUE functionally following carpal tunnel surgery a few years ago Vision/Perception  Vision - Assessment Additional Comments: L eye shield in place, bloody drainage down cheek Perception Perception: Within Functional Limits Praxis Praxis: Intact  Cognition Overall Cognitive Status: Impaired/Different from baseline Arousal/Alertness: Lethargic Orientation Level: Oriented to person;Oriented to place;Oriented to situation Memory: Impaired Awareness: Impaired Problem Solving: Impaired Safety/Judgment: Impaired Sensation Sensation Light Touch: Appears Intact Hot/Cold: Not tested Proprioception: Appears Intact Stereognosis: Appears Intact Coordination Gross Motor Movements are Fluid and Coordinated: No Fine Motor Movements are Fluid and Coordinated: No Coordination and Movement Description: very slow, rigid, dysmetric on R, limited grossly by weakness Finger Nose Finger Test: dysmetric on R, unable to assess on L due to weakness Heel Shin Test: unable to assess  due to weakness Motor  Motor Motor: Abnormal tone;Motor impersistence;Motor perseverations;Abnormal postural alignment and control Motor - Skilled Clinical Observations: increased rigidity, L hand tremor   Trunk/Postural Assessment  Cervical Assessment Cervical Assessment: Exceptions to [facility] (forward head) Thoracic Assessment Thoracic Assessment: Exceptions to [facility] (increased kyphosis, truncal rigidity) Lumbar Assessment Lumbar Assessment: Exceptions to [facility] (posterior pelvic tilt) Postural Control Postural Control: Deficits on evaluation Trunk Control: poor- maintains posterior lean requiring MaxA to correct Righting Reactions: Delayed and inadequate Protective Responses: Delayed and inadequate  Balance Balance Balance Assessed: Yes Dynamic Sitting Balance Sitting balance - Comments: R UE support + up to [place] from PT. Patient maintaining L lateral/posterior lean Extremity Assessment      RLE Assessment RLE Assessment: Exceptions to [facility] Passive Range of Motion (PROM) Comments: limited by rigidity  RLE Strength Right Hip Flexion: 2-/5 Right Hip ABduction: 2/5 Right Hip ADduction: 2/5 Right Knee Flexion: 2-/5 Right Knee Extension: 2-/5 Right Ankle Dorsiflexion: 2+/5 Right Ankle Plantar Flexion: 2+/5 LLE Assessment LLE Assessment: Exceptions to [facility] Passive Range of Motion (PROM) Comments: limited by rigidity and clonus LLE Strength Left Hip Flexion: 2-/5 Left Hip ABduction: 2-/5 Left Hip ADduction: 2-/5 Left Knee Flexion: 2-/5 Left Knee Extension: 2-/5 Left Ankle Dorsiflexion: 2-/5 Left Ankle Plantar Flexion: 2-/5  Care Tool Care Tool Bed Mobility Roll left and right activity   Roll left and right assist level: 2 Helpers    Sit to lying activity   Sit to lying assist level: 2 Helpers    Lying to sitting edge of bed activity   Lying to sitting edge of bed assist level: 2 Helpers     Care Tool Transfers Sit to stand transfer Sit to stand activity did not occur:  Safety/medical concerns (fatigue/weakness)      Chair/bed transfer   Chair/bed transfer assist level: 2 Armed forces training and education officer transfer activity did not occur: Safety/medical concerns (fatigue/weakness)      Scientist, product/process development transfer activity did not occur: Safety/medical concerns (fatigue/weakness)        Care Tool Locomotion Ambulation Ambulation activity did not occur: Safety/medical concerns (fatigue/weakness)        Walk 10 feet activity Walk 10 feet activity did not occur: Safety/medical concerns (fatigue/weakness)       Walk 50 feet with 2 turns activity Walk 50 feet with 2 turns activity did not occur: Safety/medical concerns (fatigue/weakness)      Walk 150 feet activity Walk 150 feet activity did not occur: Safety/medical concerns (fatigue/weakness)      Walk 10 feet on uneven surfaces activity Walk 10 feet on uneven surfaces activity did not occur: Safety/medical concerns (fatigue/weakness)      Stairs Stair activity did not occur: Safety/medical concerns (fatigue/weakness)        Walk up/down 1 step activity Walk up/down 1 step or curb (drop down) activity did not occur: Safety/medical concerns (fatigue/weakness)        Walk up/down 4 steps activity      Walk up/down 12 steps activity Walk up/down 12 steps activity did not occur: Safety/medical concerns (fatigue/weakness)      Pick up small objects from floor Pick up small object from the floor (from standing position) activity did not occur: Safety/medical concerns (fatigue/weakness)      Wheelchair Will patient use wheelchair at discharge?: Yes Type of Wheelchair: Manual   Wheelchair assist level: Dependent - Patient 0% (tilt in space wc does not allow for patient to propel himself)    Wheel 50 feet with 2 turns activity   Assist Level: Dependent - Patient 0% (tilt in space wc does not allow for patient to propel himself)  Wheel 150 feet activity   Assist Level: Dependent - Patient 0%  (tilt in space wc does not allow for patient to propel himself)    Refer to Care Plan for Long Term Goals  SHORT TERM GOAL WEEK 1 PT Short Term Goal 1 (Week 1): Patient will remain sitting with R UE support for >2 mins with no more than MinA x1 PT Short Term Goal 2 (Week 1): Patient will transfer bed<> wc with no more than ModA x2 PT Short Term Goal 3 (Week 1): Patient will be able to complete bed mobility with MaxA x1  Recommendations for other services: Neuropsych  Skilled Therapeutic  Intervention Mobility Bed Mobility Bed Mobility: Rolling Right;Rolling Left;Supine to Sit;Sitting - Scoot to [employer] of Bed Rolling Right: 2 Helpers Rolling Left: 2 Helpers Supine to Sit: 2 Helpers Sitting - Scoot to [employer] of Bed: 2 Helpers Transfers Transfers: Management consultant Transfers: 2 Helpers Locomotion  Gait Ambulation: No Gait Gait: No Stairs / Additional Locomotion Stairs: No Product manager Mobility: No  Patient presents with general weakness, poor posture control, clonus in L LE, tremor in L UE and decreased vision related to eye surgery resulting in patients inability to complete functional mobility without MaxA x2 effort. Patient educated on 3 hour rule, CIR expectations, team conference and expected PT POC. He remains up in wc at end of PT eval with seatbelt alarm on, call light within reach on R side.   Discharge Criteria: Patient will be discharged from PT if patient refuses treatment 3 consecutive times without medical reason, if treatment goals not met, if there is a change in medical status, if patient makes no progress towards goals or if patient is discharged from hospital.  The above assessment, treatment plan, treatment alternatives and goals were discussed and mutually agreed upon: by patient  Debbora Dus 10/01/2019, 7:38 AM

## 2019-10-01 NOTE — Progress Notes (Signed)
Inpatient Rehabilitation  Patient information reviewed and entered into eRehab system by Danija Gosa M. Ahni Bradwell, M.A., CCC/SLP, PPS Coordinator.  Information including medical coding, functional ability and quality indicators will be reviewed and updated through discharge.    

## 2019-10-01 NOTE — Progress Notes (Signed)
Pt spouse contacted nurse about visitation policy. Policy explained to pt. Spouse, as of now, will be only allowed visitor and to contact her via mobile number

## 2019-10-01 NOTE — Progress Notes (Signed)
[facility] Individual Statement of Services  Patient Name:  Nicholas Ramirez  Date:  10/01/2019  Welcome to the [facility].  Our goal is to provide you with an individualized program based on your diagnosis and situation, designed to meet your specific needs.  With this comprehensive rehabilitation program, you will be expected to participate in at least 3 hours of rehabilitation therapies Monday-Friday, with modified therapy programming on the weekends.  Your rehabilitation program will include the following services:  Physical Therapy (PT), Occupational Therapy (OT), Speech Therapy (ST), 24 hour per day rehabilitation nursing, Neuropsychology, Care Coordinator, Rehabilitation Medicine, Nutrition Services and Pharmacy Services  Weekly team conferences will be held on Wednesday to discuss your progress.  Your Inpatient Rehabilitation Care Coordinator will talk with you frequently to get your input and to update you on team discussions.  Team conferences with you and your family in attendance may also be held.  Expected length of stay: 21-23 days  Overall anticipated outcome: min some mod level of assist  Depending on your progress and recovery, your program may change. Your Inpatient Rehabilitation Care Coordinator will coordinate services and will keep you informed of any changes. Your Inpatient Rehabilitation Care Coordinator's name and contact numbers are listed  below.  The following services may also be recommended but are not provided by the [facility]:    [place] will be made to provide these services after discharge if needed.  Arrangements include referral to agencies that provide these services.  Your insurance has been verified to be:  [employer]-Medicare Your primary doctor is:  Barbie Haggis  Pertinent information will be shared with your  doctor and your insurance company.  Inpatient Rehabilitation Care Coordinator:  Ovidio Kin, [place] or Emilia Beck  Information discussed with and copy given to patient by: Elease Hashimoto, 10/01/2019, 1:24 PM

## 2019-10-01 NOTE — Plan of Care (Signed)
Problem: RH Balance Goal: LTG: Patient will maintain dynamic sitting balance (OT) Description: LTG:  Patient will maintain dynamic sitting balance with assistance during activities of daily living (OT) Flowsheets (Taken 10/01/2019 1256) LTG: Pt will maintain dynamic sitting balance during ADLs with: Supervision/Verbal cueing   Problem: Sit to Stand Goal: LTG:  Patient will perform sit to stand in prep for activites of daily living with assistance level (OT) Description: LTG:  Patient will perform sit to stand in prep for activites of daily living with assistance level (OT) Flowsheets (Taken 10/01/2019 1256) LTG: PT will perform sit to stand in prep for activites of daily living with assistance level: Minimal Assistance - Patient > 75%   Problem: RH Eating Goal: LTG Patient will perform eating w/assist, cues/equip (OT) Description: LTG: Patient will perform eating with assist, with/without cues using equipment (OT) Flowsheets (Taken 10/01/2019 1256) LTG: Pt will perform eating with assistance level of: Set up assist    Problem: RH Grooming Goal: LTG Patient will perform grooming w/assist,cues/equip (OT) Description: LTG: Patient will perform grooming with assist, with/without cues using equipment (OT) Flowsheets (Taken 10/01/2019 1256) LTG: Pt will perform grooming with assistance level of: Supervision/Verbal cueing   Problem: RH Bathing Goal: LTG Patient will bathe all body parts with assist levels (OT) Description: LTG: Patient will bathe all body parts with assist levels (OT) Flowsheets (Taken 10/01/2019 1256) LTG: Pt will perform bathing with assistance level/cueing: Moderate Assistance - Patient 50 - 74%   Problem: RH Dressing Goal: LTG Patient will perform upper body dressing (OT) Description: LTG Patient will perform upper body dressing with assist, with/without cues (OT). Flowsheets (Taken 10/01/2019 1256) LTG: Pt will perform upper body dressing with assistance level of: Minimal  Assistance - Patient > 75% Goal: LTG Patient will perform lower body dressing w/assist (OT) Description: LTG: Patient will perform lower body dressing with assist, with/without cues in positioning using equipment (OT) Flowsheets (Taken 10/01/2019 1256) LTG: Pt will perform lower body dressing with assistance level of: Moderate Assistance - Patient 50 - 74%   Problem: RH Toileting Goal: LTG Patient will perform toileting task (3/3 steps) with assistance level (OT) Description: LTG: Patient will perform toileting task (3/3 steps) with assistance level (OT)  Flowsheets (Taken 10/01/2019 1256) LTG: Pt will perform toileting task (3/3 steps) with assistance level: Moderate Assistance - Patient 50 - 74%   Problem: RH Functional Use of Upper Extremity Goal: LTG Patient will use RT/LT upper extremity as a (OT) Description: LTG: Patient will use right/left upper extremity as a stabilizer/gross assist/diminished/nondominant/dominant level with assist, with/without cues during functional activity (OT) Flowsheets (Taken 10/01/2019 1256) LTG: Use of upper extremity in functional activities: LUE as gross assist level LTG: Pt will use upper extremity in functional activity with assistance level of: Supervision/Verbal cueing   Problem: RH Toilet Transfers Goal: LTG Patient will perform toilet transfers w/assist (OT) Description: LTG: Patient will perform toilet transfers with assist, with/without cues using equipment (OT) Flowsheets (Taken 10/01/2019 1256) LTG: Pt will perform toilet transfers with assistance level of: Contact Guard/Touching assist   Problem: RH Tub/Shower Transfers Goal: LTG Patient will perform tub/shower transfers w/assist (OT) Description: LTG: Patient will perform tub/shower transfers with assist, with/without cues using equipment (OT) Flowsheets (Taken 10/01/2019 1256) LTG: Pt will perform tub/shower stall transfers with assistance level of: Contact Guard/Touching assist   Problem:  RH Awareness Goal: LTG: Patient will demonstrate awareness during functional activites type of (OT) Description: LTG: Patient will demonstrate awareness during functional activites type of (OT) Flowsheets (  Taken 10/01/2019 1256) Patient will demonstrate awareness during functional activites type of: Intellectual LTG: Patient will demonstrate awareness during functional activites type of (OT): Supervision

## 2019-10-01 NOTE — Progress Notes (Signed)
PHYSICAL MEDICINE & REHABILITATION PROGRESS NOTE   Subjective/Complaints: Patient seen laying in bed, working with therapy this morning.  He states he did not sleep well overnight, "does not know why".  ROS: + Abdominal pain.  Denies CP, SOB, N/V/D  Objective:    NM Hepatobiliary Liver Func  Result Date: 09/30/2019 CLINICAL DATA:  Chronic upper abdominal pain, gallbladder wall thickening on ultrasound, assess gallbladder motility EXAM: NUCLEAR MEDICINE HEPATOBILIARY IMAGING TECHNIQUE: Sequential images of the abdomen were obtained out to 60 minutes following intravenous administration of radiopharmaceutical. Due to nonvisualization of the gallbladder, 3 mg of morphine was administered and imaging was continued for an additional 30 minutes. RADIOPHARMACEUTICALS:  5.3 mCi Tc-84m  Choletec IV COMPARISON:  Ultrasound abdomen 09/27/2019 FINDINGS: Delayed clearance of tracer from bloodstream indicating impaired hepatocellular function. Abnormal appearance of liver with inferior position and discontiguous appearance of the lateral segment LEFT lobe versus remainder of liver, question hepatic anomaly. Significant retention of tracer within hepatic parenchyma as well as within dilated central biliary tree of the lateral segment LEFT lobe. Delayed visualization of CBD. No definite bowel activity is identified by the conclusion of the exam. Gallbladder is probably visualized just before morphine administration with increased tracer accumulation in probable gallbladder following morphine. 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 Visualization of central biliary radicles, probably gallbladder, and CBD. No small bowel activity is identified to confirm CBD patency; in light of the biliary dilatation identified by ultrasound, recommend assessment by MR imaging with MRCP imaging to evaluate. If patient is unable to  undergo MR imaging, recommend CT assessment, with IV contrast if renal function permits. Electronically Signed   By: Lavonia Dana M.D.   On: 09/30/2019 14:25   Recent Labs    09/29/19 1001 10/01/19 0452  WBC 11.2* 9.6  HGB 13.5 13.2  HCT 39.8 38.6*  PLT 96* 73*   Recent Labs    09/30/19 0429 10/01/19 0452  NA 141 144  K 5.1 5.0  CL 108 108  CO2 24 26  GLUCOSE 118* 132*  BUN 82* 86*  CREATININE 2.91* 2.98*  CALCIUM 8.4* 8.5*    Intake/Output Summary (Last 24 hours) at 10/01/2019 1003 Last data filed at 10/01/2019 0715 Gross per 24 hour  Intake 300 ml  Output 1275 ml  Net -975 ml        Physical Exam: Vital Signs Blood pressure (!) 142/93, pulse 93, temperature 97.7 F (36.5 C), temperature source Oral, resp. rate 20, weight 107 kg, SpO2 97 %. Constitutional: No distress . Vital signs reviewed. HENT: Normocephalic.  Atraumatic. Eyes: EOMI. No discharge. Cardiovascular: No JVD.  RRR. Respiratory: Normal effort.  No stridor.  Bilateral clear to auscultation. GI: Distended.  BS +. Skin: Warm and dry.  Intact. Vascular changes bilateral lower extremities Psych: Flat. Musc: Generalized swelling in extremities. Left periorbital edema and tenderness Neuro: Alert and oriented, except for date of month Motor: RUE/RLE: 3+/5 proximal distal LUE: Limited due to significant tone LLE: 3 -/5 proximal distal Left lower extremity clonus  Assessment/Plan: 1. Functional deficits secondary to Parkinson's, with recent anterior left globe rupture which require 3+ hours per day of interdisciplinary therapy in a comprehensive inpatient rehab setting.  Physiatrist is providing close team supervision and 24 hour management of active medical problems listed below.  Physiatrist and rehab team continue to assess barriers to discharge/monitor patient progress toward functional and medical goals  Care Tool:  Bathing  Bathing assist       Upper Body  Dressing/Undressing Upper body dressing        Upper body assist      Lower Body Dressing/Undressing Lower body dressing            Lower body assist       Toileting Toileting    Toileting assist       Transfers Chair/bed transfer  Transfers assist           Locomotion Ambulation   Ambulation assist              Walk 10 feet activity   Assist           Walk 50 feet activity   Assist           Walk 150 feet activity   Assist           Walk 10 feet on uneven surface  activity   Assist           Wheelchair     Assist               Wheelchair 50 feet with 2 turns activity    Assist            Wheelchair 150 feet activity     Assist          Medical Problem List and Plan: 1.Functional deficitssecondary to Parkinson's Gait disorder. Recent fall with rupture of anterior left globe  Begin CIR evaluations 2. Antithrombotics: -DVT/anticoagulation:Mechanical:Antiembolism stockings, knee (TED hose) Bilateral lower extremities Sequential compression devices, below kneeBilateral lower extremities -antiplatelet therapy: n/a 3.Abdominal pain/Pain Management:Continue oxycodoneprn  Monitor with increased exertion 4. Mood:team to provide ego support -antipsychotic agents: n/a 5. Neuropsych: This patient is?  Not fully capable of making decisions on his own behalf. 6. Skin/Wound Care:local care as indicated 7. Fluids/Electrolytes/Nutrition:encourage appropriate po 8. Ruptured globe left eye s/p surgical repair 9/16 by ophthalmology Dr. Eulas Post -eye shield at all times -broad spectrum coverage transitioned tolevofloxacin and doxycyclinefor 2 additional days -continueeye gttsfor 2 additional weeks(~10/12) -outpt f/u with Dr. Eulas Post after discharge 9. HTN: -pt on lisinopril, amlodipine, propranolol at  home -Coreg started on 9/27   Moderate increased mobility  10. BMW:[id number], likely related toATN v/svancomycin Mirabegron ER and finasteride resumed  Creatinine 2.98 on 9/28  Continue to monitor 11.Parkinson's Disease Sinemet added recently but not started per notes. Pt followed by Dr. Tomi Likens  Will consider initiation 12. Hyponatremia, mild SIADH: Resolved 13. Prolonged QT interval: Avoid rate prolongation meds 14. Constipation Senna-s two tabs at HS  Abdominal x-ray ordered  Adjust bowel meds as necessary 15.Distended abdomen HIDA scan negative.Some chronic findings due to abnl small bowel motility   LFTs elevated on 9/28, continue to monitor  See #14 16.  Hyperkalemia  Potassium 5.0 on 9/28  Continue Lokelma  Continue to monitor 17.  Hypoalbuminemia  Supplement initiated on 9/28 18.  Spasticity versus rigidity  See #11  Will consider neck MRI 19.  Dysphagia  D2 thins, advance diet as tolerated  LOS: 1 days A FACE TO FACE EVALUATION WAS PERFORMED  Madi Bonfiglio Lorie Phenix 10/01/2019, 10:03 AM

## 2019-10-01 NOTE — Progress Notes (Signed)
Patient Details  Name: Nicholas Ramirez MRN: [id number] Date of Birth: 1945/03/22  Today's Date: 10/01/2019  Hospital Problems: Principal Problem:   Parkinson's disease [facility]) Active Problems:   Debility   Abdominal distention   Hypoalbuminemia due to protein-calorie malnutrition (HCC)   Hypokalemia   Dysphagia   Transaminitis   Hyperkalemia   AKI (acute kidney injury) ([place])  Past Medical History:  Past Medical History:  Diagnosis Date  . Asthma   . Depression   . Heart murmur   . Hepatitis C   . Hypertension    Past Surgical History:  Past Surgical History:  Procedure Laterality Date  . CARPAL TUNNEL RELEASE Left   . RUPTURED GLOBE EXPLORATION AND REPAIR Left 09/18/2019   Procedure: REPAIR OF RUPTURED GLOBE;  Surgeon: Lonia Skinner, MD;  Location: [facility];  Service: Ophthalmology;  Laterality: Left;   Social History:  reports that he quit smoking about 34 years ago. He has never used smokeless tobacco. He reports previous drug use. He reports that he does not drink alcohol.  Family / Support Systems Marital Status: Married Patient Roles: Spouse, Parent Spouse/Significant Other: Vicente Males [phone]  [phone]-home Children: Deanna-daughter [phone] Other Supports: Both pt and wife have two children from first marriages Anticipated Caregiver: Anna Ability/Limitations of Caregiver: Wife willing to assist but does have back issues-buldging disc. Was assisting with bathing and dressing prior to admission Caregiver Availability: 24/7 Family Dynamics: Close with all of their children but it is mainly pt and wife. Wife reports they have their own lives and are busy.  Social History Preferred language: English Religion: None Cultural Background: No issues Education: HS Read: Yes Write: Yes Employment Status: Retired Public relations account executive Issues: No issues Guardian/Conservator: None-according to MD pt is not fully capable of mkaing his own decisions while here    Abuse/Neglect Abuse/Neglect Assessment Can Be Completed: Yes Physical Abuse: Denies Verbal Abuse: Denies Sexual Abuse: Denies Exploitation of patient/patient's resources: Denies Self-Neglect: Denies  Emotional Status Pt's affect, behavior and adjustment status: Pt is motivated but did not sleep well last night and wants to do therapies but tired. He will do his best but unsure why he is having balance issues. He has declined since carpel tunnel surgery 01/2018 Recent Psychosocial Issues: other health issues-being worked up for Parkinsons's prior to admission but according to wife who reports they do not know for sure if it is parkinson's Psychiatric History: No history or issues-may benefit from seeing neuro-psych while here for their input Substance Abuse History: no issues  Patient / Family Perceptions, Expectations & Goals Pt/Family understanding of illness & functional limitations: Pt has a basic understanding of his injury and falling at home but unsure reason why. Wife talks with the MD and feels she knows what they know up to this point. She voiced they have not determined what he has and what is causing his falls. Premorbid pt/family roles/activities: Husband, father, grandfather, retiree, church member,etc Anticipated changes in roles/activities/participation: resume Pt/family expectations/goals: Pt states: " I want to walk better."  Wife states: " I would like them to find out what is making him fall and go from there. He may be having mini strokes."  [employer]: None Premorbid Home Care/DME Agencies: Other (Comment) (rollator tub seat) Transportation available at discharge: Wife drives, pt stopeed after surgery 01/2018 Resource referrals recommended: Neuropsychology  Discharge Planning Living Arrangements: Spouse/significant other Support Systems: Spouse/significant other, Children, Friends/neighbors Type of Residence: Private residence Insurance  Resources: Multimedia programmer (specify) Primary school teacher) Financial  Resources: Radio broadcast assistant Screen Referred: No Living Expenses: Own Money Management: Spouse Does the patient have any problems obtaining your medications?: No Home Management: Wife and she has a hired person to come in 2 hr-4 days a week to assist with this. Wife has ben bathing pt since 01/2018 after that surgery Patient/Family Preliminary Plans: Return home with wife who plans to provide care. Made aware with all of his falling he will require hands on care when up moving so as ot limit his falls at home. Aware team setting goals and working on treatment plan. Care Coordinator Anticipated Follow Up Needs: HH/OP  Clinical Impression Pleasant gentleman who is willing to work in therapies but voiced he is tired from not sleeping last night. His wife has been assisting him prior to admission with bathing and dressing. He has had numerous falls at home prior to admission and MD looking into possible Parkinson's diagnosis. Wife has purchased a hospital bed and it is being set up. Will work with on safe discharge plan for pt, aware team conference tomorrow.   Elease Hashimoto 10/01/2019, 1:19 PM

## 2019-10-01 NOTE — Evaluation (Signed)
Occupational Therapy Assessment and Plan  Patient Details  Name: Nicholas Ramirez MRN: [id number] Date of Birth: 01-Dec-1945  OT Diagnosis: abnormal posture, altered mental status, blindness and low vision, cognitive deficits, disturbance of vision and  frequent falls Rehab Potential:   ELOS: 21-23 days   Today's Date: 10/01/2019 OT Individual Time: 7829-5621 OT Individual Time Calculation (min): 71 min     Hospital Problem: Principal Problem:   Parkinson's disease ([place]) Active Problems:   Debility   Abdominal distention   Hypoalbuminemia due to protein-calorie malnutrition ([place])   Hypokalemia   Dysphagia   Transaminitis   Hyperkalemia   AKI (acute kidney injury) ([place])   Past Medical History:  Past Medical History:  Diagnosis Date  . Asthma   . Depression   . Heart murmur   . Hepatitis C   . Hypertension    Past Surgical History:  Past Surgical History:  Procedure Laterality Date  . CARPAL TUNNEL RELEASE Left   . RUPTURED GLOBE EXPLORATION AND REPAIR Left 09/18/2019   Procedure: REPAIR OF RUPTURED GLOBE;  Surgeon: Nicholas Skinner, MD;  Location: [facility];  Service: Ophthalmology;  Laterality: Left;    Assessment & Plan Clinical Impression: Nicholas Ramirez is a32 yo male with history of HTN, hepatitis C who has had multiple falls at home over the last few months. Pt was seen by neurology and after extensive work up it was felt that he was suffering from potential Parkinson's Disease. Dr. Tomi Ramirez started him sinemet09/13 with MRI C spine ordered for work up.Pt was admitted on 09/18/19 after a fall at home where his head struck a Ecologist. He was unable to see after the fall CT of eyes/head revealed hyphema, ruptured globe left eye with open globe, uveal prolapse with loss of tissue and no light preception.Ophthalmology was consulted and patient underwent surgical repair of the left eye by Dr. Eulas Ramirez on 9/16. Pt was placed on broad spectrum antibiotics for coverage.To continue  eye shield at all times and eye drops for one month Ramirez op.  Pt subsequently developed AKI, nephrology was consulted and vancomycin was discontinued.AKI felt to be multifactorial with UOP improving Ramirez foley placement for BOO.Renal function has slowly improved, no HD necessaryand nephrology following for input.Abdominal ultrasound showed distended GB with intrahepatic biliary duct dilatation and suspicion of cirrhosis.Elevated LFT being monitored and HIDA scan ordered due to issues with abdominal pain and concerns of acute cholecystitis-->negative for acute pathology and no surgical intervention needed.Pt was screened by the inpatient rehab team, and it was felt that he could benefit from interdisciplinary rehab to address his balance and functional deficits related to his PD and recent fall/med-surg complications.  Patient transferred to CIR on 09/30/2019 .    Patient currently requires total with basic self-care skills secondary to muscle weakness, decreased cardiorespiratoy endurance, impaired timing and sequencing, abnormal tone, unbalanced muscle activation, decreased coordination and decreased motor planning, decreased visual acuity, decreased visual perceptual skills and decreased L side attention, decreased midline orientation, decreased attention to left and decreased motor planning, decreased initiation, decreased attention, decreased awareness, decreased problem solving and decreased safety awareness,   and decreased sitting balance, decreased standing balance, decreased postural control and decreased balance strategies.  Prior to hospitalization, patient could complete BADLs with mod.  Patient will benefit from skilled intervention to decrease level of assist with basic self-care skills and increase independence with basic self-care skills prior to discharge home with care partner.  Anticipate patient will require 24 hour supervision and follow up home  health.  OT - End of  Session Activity Tolerance: Tolerates 30+ min activity with multiple rests Endurance Deficit: Yes Endurance Deficit Description: multiple frequent rest breaks required OT Assessment OT Patient demonstrates impairments in the following area(s): Balance;Cognition;Edema;Endurance;Motor;Safety;Skin Integrity;Vision OT Basic ADL's Functional Problem(s): Eating;Grooming;Bathing;Dressing;Toileting OT Transfers Functional Problem(s): Toilet;Tub/Shower OT Additional Impairment(s): Fuctional Use of Upper Extremity OT Plan OT Intensity: Minimum of 1-2 x/day, 45 to 90 minutes OT Frequency: 5 out of 7 days OT Duration/Estimated Length of Stay: 21-23 days OT Treatment/Interventions: Balance/vestibular training;Discharge planning;Functional electrical stimulation;Self Care/advanced ADL retraining;Therapeutic Activities;UE/LE Coordination activities;Visual/perceptual remediation/compensation;Therapeutic Exercise;Skin care/wound managment;Patient/family education;Functional mobility training;Disease mangement/prevention;Cognitive remediation/compensation;Community reintegration;DME/adaptive equipment instruction;Neuromuscular re-education;Psychosocial support;UE/LE Strength taining/ROM;Wheelchair propulsion/positioning;Splinting/orthotics;Pain management OT Self Feeding Anticipated Outcome(s): Set up OT Basic Self-Care Anticipated Outcome(s): Mod overall OT Toileting Anticipated Outcome(s): Mod A OT Bathroom Transfers Anticipated Outcome(s): CGA-Min A OT Recommendation Patient destination: Home Follow Up Recommendations: Home health OT Equipment Recommended: To be determined Equipment Details: pt has TTB, all other DME TBD   OT Evaluation Precautions/Restrictions  Precautions Precautions: Fall;Other (comment) (L eye shield all times) Precaution Comments: pt admitted for a fall, daughter reports 15-20 in last 6 months Restrictions Weight Bearing Restrictions: No Pain Pain Assessment Pain Scale:  0-10 Pain Score: 0-No pain Home Living/Prior Functioning Home Living Family/patient expects to be discharged to:: Private residence Living Arrangements: Spouse/significant other Available Help at Discharge: Available 24 hours/day Type of Home: House Home Access: Level entry Home Layout: One level Bathroom Shower/Tub: Chiropodist: Standard Bathroom Accessibility: No Additional Comments: no AD or grab bars. pt with difficulty grasping with LUE  Lives With: Spouse IADL History Homemaking Responsibilities: No Occupation: Retired Prior Function Level of Independence: Needs assistance with ADLs, Needs assistance with gait, Needs assistance with tranfers  Able to Take Stairs?: No Driving: No Vocation: Retired Leisure: Hobbies-yes (Comment) (fishing, go to [employer]) Comments: high frequency of falls, undecreased use of LUE functionally following carpal tunnel surgery a few years ago Vision Baseline Vision/History:  (L globe repair after fall, decreased tracking in R eye) Patient Visual Report: Other (comment) (L globe repair) Vision Assessment?: Yes Eye Alignment: Within Functional Limits Ocular Range of Motion: Restricted on the left;Impaired-to be further tested in functional context Tracking/Visual Pursuits: Impaired - to be further tested in functional context;Decreased smoothness of eye movement to LEFT superior field;Decreased smoothness of eye movement to LEFT inferior field Additional Comments: L eye shield in place, decreased tracking to L side Perception  Perception: Impaired (decreased body awareness, L lateral lean) Praxis Praxis: Intact Cognition Overall Cognitive Status: Impaired/Different from baseline Arousal/Alertness: Lethargic Orientation Level: Person;Situation Year: 2021 Month: September Day of Week: Incorrect Memory: Impaired Memory Impairment: Decreased recall of new information Immediate Memory Recall: Sock;Blue;Bed Memory Recall Sock:  Not able to recall Memory Recall Blue: Without Cue Memory Recall Bed: Not able to recall Awareness: Impaired Problem Solving: Impaired Safety/Judgment: Impaired Sensation Sensation Light Touch: Appears Intact Proprioception: Appears Intact Stereognosis: Appears Intact Coordination Gross Motor Movements are Fluid and Coordinated: No Fine Motor Movements are Fluid and Coordinated: No Coordination and Movement Description: very slow, rigid, dysmetric on R, limited grossly by weakness Motor  Motor Motor: Abnormal tone;Motor impersistence;Motor perseverations;Abnormal postural alignment and control;Clonus Motor - Skilled Clinical Observations: increased rigidity, L hand tremor, weakness limiting  Trunk/Postural Assessment  Cervical Assessment Cervical Assessment: Exceptions to [facility] Thoracic Assessment Thoracic Assessment: Exceptions to [facility] Lumbar Assessment Lumbar Assessment: Exceptions to [facility] Postural Control Postural Control: Deficits on evaluation Trunk Control: poor- maintains posterior and L lean requiring MaxA  to correct Righting Reactions: Delayed and inadequate Protective Responses: Delayed and inadequate  Balance Balance Balance Assessed: Yes Dynamic Sitting Balance Sitting balance - Comments: R UE support + up to [place]. Patient maintaining L lateral/posterior lean requiring max cues and physical assist to correct., Extremity/Trunk Assessment RUE Assessment RUE Assessment: Exceptions to [facility] Active Range of Motion (AROM) Comments: WFL General Strength Comments: 3+ grossly at shoulder and bicep, 4/5 tricep LUE Assessment LUE Assessment: Exceptions to [facility] Active Range of Motion (AROM) Comments: minimal AROM shoulder flex/ext/abd/add General Strength Comments: roughly 2/5, decreased AROM and abnormal tone noted, decreased functional use of L hand as well from old carpal tunnel surgery  Care Tool Care Tool Self Care Eating    Supervision    Oral Care    Oral Care  Assist Level: Minimal Assistance - Patient > 75%    Bathing   Body parts bathed by patient: Right arm;Left arm;Chest;Abdomen;Face Body parts bathed by helper:  (declined LB bathing at time of eval)   Assist Level: Total Assistance - Patient < 25%    Upper Body Dressing(including orthotics)   What is the patient wearing?: Hospital gown only   Assist Level: Maximal Assistance - Patient 25 - 49%    Lower Body Dressing (excluding footwear)   What is the patient wearing?: Pants Assist for lower body dressing: 2 Helpers    Putting on/Taking off footwear   What is the patient wearing?: Non-skid slipper socks Assist for footwear: Dependent - Patient 0%       Care Tool Toileting Toileting activity Toileting Activity did not occur (Clothing management and hygiene only):  (not performed at time of eval, has Foley for urine and no need for BM at time of eval) Assist for toileting: Total Assistance - Patient < 25%     Care Tool Bed Mobility Roll left and right activity   Roll left and right assist level: 2 Helpers    Sit to lying activity   Sit to lying assist level: 2 Helpers    Lying to sitting edge of bed activity   Lying to sitting edge of bed assist level: 2 Helpers     Care Tool Transfers Sit to stand transfer Sit to stand activity did not occur: Safety/medical concerns (fatigue/weakness) Sit to stand assist level: 2 Helpers (at [employer])    Chair/bed transfer   Chair/bed transfer assist level: 2 Armed forces training and education officer transfer activity did not occur: Safety/medical concerns (fatigue/weakness)       Care Tool Cognition Expression of Ideas and Wants Expression of Ideas and Wants: Some difficulty - exhibits some difficulty with expressing needs and ideas (e.g, some words or finishing thoughts) or speech is not clear   Understanding Verbal and Non-Verbal Content Understanding Verbal and Non-Verbal Content: Usually understands - understands most conversations, but  misses some part/intent of message. Requires cues at times to understand   Memory/Recall Ability *first 3 days only Memory/Recall Ability *first 3 days only: That he or she is in a hospital/hospital unit    Refer to Care Plan for Long Term Goals  Skilled Intervention: Pt greeted at time of session sitting up in wheelchair agreeable to OT, discussed role and purpose of OT as well and structure of CIR program. Pt's wife called during session, spoke with her regarding OT POC and wife agreeable, very pleasant and appreciative.   Sit to stand at [facility] of 2 with facilitating LUE to reach and hold bar, pt able to help  minimally with pulling up to bar, requiring significant amount of assist. Static standing at [facility] with BUE support, L lateral lean present requiring Mod A for upright standing once in [place], unable to correct lateral lean without assist. Brought to sink level via [place] for NMR and feedback, pt able to know when he leans to the L and is able to assist in correcting but did need assist. Lab personal with EKG entered the room, requiring the pt to be transferred back to wheelchair for stat EKG, Max A to control descent back in to wheelchair for EKG. After procedure, set up at sink level and performed oral hygiene and UB bathe/dress with Min A for oral hygiene, Max for UB bathe and dress seated. Extended time for all tasks, cues to return to midline, and decreased functional use of LUE. Set up in wheelchair with LUE elevated for proper positioning and to decrease edema, alarm on and call bell in reach.   SHORT TERM GOAL WEEK 1 OT Short Term Goal 1 (Week 1): Pt will transition sit to stand with most appropriate AD with Max of 1 to assist with LB ADLs OT Short Term Goal 2 (Week 1): Pt will perform static stands for 1-3 minutes with no more than Mod Ax1  for upright standing OT Short Term Goal 3 (Week 1): Pt will need no more than Min verbal cues to decrease Left lateral lean and orient to  midline OT Short Term Goal 4 (Week 1): Pt will perform UB dress with Mod A  Recommendations for other services: None    Skilled Therapeutic Intervention ADL ADL Grooming: Moderate assistance Where Assessed-Grooming: Sitting at sink Upper Body Bathing: Maximal assistance Where Assessed-Upper Body Bathing: Sitting at sink Lower Body Bathing: Dependent Upper Body Dressing: Maximal assistance Lower Body Dressing: Dependent Toileting: Dependent ADL Comments: Max/total of 2 for sit to stands and all transfers Mobility  Bed Mobility Bed Mobility: Rolling Right;Rolling Left;Supine to Sit;Sitting - Scoot to [employer] of Bed Rolling Right: 2 Helpers Rolling Left: 2 Helpers Supine to Sit: 2 Helpers Sitting - Scoot to [employer] of Bed: 2 Helpers   Discharge Criteria: Patient will be discharged from OT if patient refuses treatment 3 consecutive times without medical reason, if treatment goals not met, if there is a change in medical status, if patient makes no progress towards goals or if patient is discharged from hospital.  The above assessment, treatment plan, treatment alternatives and goals were discussed and mutually agreed upon: by patient  Viona Gilmore 10/01/2019, 12:37 PM

## 2019-10-01 NOTE — Progress Notes (Signed)
Occupational Therapy Session Note  Patient Details  Name: Nicholas Ramirez MRN: [id number] Date of Birth: [birth year]  Today's Date: 10/01/2019 OT Individual Time: 0277-4128 OT Individual Time Calculation (min): 60 min    Short Term Goals: Week 1:  OT Short Term Goal 1 (Week 1): Pt will transition sit to stand with most appropriate AD with Max of 1 to assist with LB ADLs OT Short Term Goal 2 (Week 1): Pt will perform static stands for 1-3 minutes with no more than Mod Ax1  for upright standing OT Short Term Goal 3 (Week 1): Pt will need no more than Min verbal cues to decrease Left lateral lean and orient to midline OT Short Term Goal 4 (Week 1): Pt will perform UB dress with Mod A  Skilled Therapeutic Interventions/Progress Updates:  Treatment session with focus on bed mobility, sitting balance, and sit <> stand.  Pt received in bed being fed by nurse tech.  Engaged in bed mobility with max assist +2 to come to sitting EOB.  Pt able to maintain sitting at EOB with mod-max assist due to Lt lean in unsupported sitting.  Engaged in sit > stand in [place] with pt standing from elevated bed height x2 with max assist +2.  Engaged in sit > stand x2 from [place] with mod assist of 1 and cues for weight shifting.  Pt demonstrating Lt gaze preference and head turn, requiring cues for midline and scanning to Rt.  Engaged in sitting balance at EOB with pt requiring mod assist for sitting balance and midline orientation.  Returned to supine with +2 assist and resumed assisting with meal tray due to impaired vision and decreased motor control with BUE.  Noted increased shaking in LUE with attempts to reach and reposition.  Provided pt with soft-touch call bell and educated on use.  Pt remained semi-reclined in bed with all needs in reach and BUE propped on pillows for improved positioning.  Therapy Documentation Precautions:  Precautions Precautions: Fall, Other (comment) (L eye shield all times) Precaution  Comments: pt admitted for a fall, daughter reports 15-20 in last 6 months Restrictions Weight Bearing Restrictions: No General:   Vital Signs: Therapy Vitals Temp: 97.9 F (36.6 C) Pulse Rate: 87 Resp: 16 BP: 127/88 Patient Position (if appropriate): Lying Oxygen Therapy SpO2: 97 % O2 Device: Room Air Pain: Pain Assessment Pain Scale: 0-10 Pain Score: 0-No pain   Therapy/Group: Individual Therapy  Simonne Come 10/01/2019, 3:40 PM

## 2019-10-01 NOTE — Progress Notes (Signed)
Subjective:Patient now on inpatient rehab floor. Complains of soreness OS.  EXAMINATION  [facility] OS:NLP  Pupils:  ZV:[id number] not visible due to complete hyphema  T(Pen): OS:76mm Hg previously (tonopen not reading 10/01/19 - would estimated 3-5 mm Hg based on appearance of indentation while checking tonopen),   Anterior SegmentExam (patient lying in bed, exam with penlight/indirect/20D lens): Ext/Lids:R:normalL:+lidedema/ecchymoses, no lid laceration Conj/Sclera: ODwhite and quiet UO:RVIFBPP sutures intact. 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 - POD#12s/p Open globe repair with resection of prolapsed uveal tissue - complete water-tight closure not able to be achieved due to tissue loss and edema - No light perception vision on presentationand on exam today - We have discussed evisceration or enucleation may be needed if the eye is not able to heal and stabilize--given continued pain and serosanguinous discharge, I think that enucleation or evisceration is looking more likely--would need to see oculoplastics outpatient for this - eye examinations at bedside are limited in an inpatient setting-- once patient is cleared medically to discharge from the hospital outpatient follow-up would certainly aid this - eyeshield at all times - We discussed the visual prognosis for the eye is extremely guardedas before -AKI during hospital stay and Vanc stopped. Now on Levofloxacin PO and Doxycycline PO to finish  prophylactic antibiotic course - Eye drops as below - gatifloxacin QID OS - Pred acetate 1% QID OS - Neo/Poly/Dex ointment QID OS - Please space drops and ointment by at least 5 minutes, placing the ointment last, and then resecure/retape the [place] [facility]

## 2019-10-01 NOTE — Progress Notes (Signed)
Initial Nutrition Assessment  DOCUMENTATION CODES:  Obesity unspecified  INTERVENTION:  - Ensure Enlive po BID, each supplement provides 350 kcal and 20 grams of protein  - ProSource Plus po BID, each supplement provides 100 kcal and 15 grams of protein  - Magic Cup BID with meals, each supplement provides 290 kcal and 9 grams of protein  NUTRITION DIAGNOSIS:   Inadequate oral intake related to constipation, decreased appetite as evidenced by meal completion < 50%.  GOAL:   Patient will meet greater than or equal to 90% of their needs  MONITOR:   PO intake, Supplement acceptance, Labs, Weight trends, Skin, I & O's  REASON FOR ASSESSMENT:   Malnutrition Screening Tool    ASSESSMENT:   74 year old male with PMH of HTN, hepatitis C. Pt seen by neurology and diagnosed with Parkinson's. Pt admitted on 09/18/19 after a fall at home where his head struck a Ecologist. CT of eyes/head revealed hyphema, ruptured globe left eye with open globe, uveal prolapse with loss of tissue and no light preception. Pt underwent surgical repair of the left eye on 9/16. Pt admitted to CIR on 9/27.   Spoke with pt at bedside. Abdomen distended. KUB completed this afternoon and results pending. Pt reporting decreased appetite and constipation. Pt reports that for lunch he was only able to eat some ice cream. RD discussed with pt the importance of adequate PO intake related to healing. Pt expresses understanding. Pt willing to try Ensure Enlive and Magic Cup supplements.  Reviewed weight history in chart. Pt with a 4.1 kg weight loss since 09/16/19. However, weight on admission appears stated rather than measured. If accurate, pt has experienced a 3.7% weight loss in 2 weeks which is significant for timeframe. Pt is at risk for malnutrition.  Meal Completion: 25%  Medications reviewed and include: ProSource Plus BID, solu-cortef, nystatin, miralax, senna, lokelma  Labs reviewed: elevated LFTs  NUTRITION  - FOCUSED PHYSICAL EXAM:    Most Recent Value  Orbital Region Mild depletion  Upper Arm Region No depletion  Thoracic and Lumbar Region No depletion  Buccal Region No depletion  Temple Region Mild depletion  Clavicle Bone Region Mild depletion  Clavicle and Acromion Bone Region Mild depletion  Scapular Bone Region No depletion  Dorsal Hand No depletion  Patellar Region No depletion  Anterior Thigh Region No depletion  Posterior Calf Region No depletion  Edema (RD Assessment) Moderate  [BUE, BLE]  Hair Reviewed  Eyes Reviewed  Mouth Reviewed  Skin Reviewed  Nails Reviewed       Diet Order:   Diet Order            DIET DYS 2 Room service appropriate? Yes with Assist; Fluid consistency: Thin  Diet effective now                 EDUCATION NEEDS:   Education needs have been addressed  Skin:  Skin Assessment: Skin Integrity Issues: Incisions: left eye  Last BM:  09/27/19  Height:   Ht Readings from Last 1 Encounters:  09/18/19 5\' 9"  (1.753 m)    Weight:   Wt Readings from Last 1 Encounters:  10/01/19 107 kg    Ideal Body Weight:  72.7 kg  BMI:  Body mass index is 34.85 kg/m.  Estimated Nutritional Needs:   Kcal:  2000-2200  Protein:  100-115 grams  Fluid:  >/= 2.0 L    Gaynell Face, MS, RD, LDN Inpatient Clinical Dietitian Please see AMiON for contact information.

## 2019-10-02 ENCOUNTER — Inpatient Hospital Stay (HOSPITAL_COMMUNITY): Payer: Medicare Other | Admitting: Occupational Therapy

## 2019-10-02 ENCOUNTER — Inpatient Hospital Stay (HOSPITAL_COMMUNITY): Payer: Medicare Other

## 2019-10-02 DIAGNOSIS — R9431 Abnormal electrocardiogram [ECG] [EKG]: Secondary | ICD-10-CM

## 2019-10-02 DIAGNOSIS — D72829 Elevated white blood cell count, unspecified: Secondary | ICD-10-CM

## 2019-10-02 LAB — COMPREHENSIVE METABOLIC PANEL
ALT: 108 U/L — ABNORMAL HIGH (ref 0–44)
AST: 122 U/L — ABNORMAL HIGH (ref 15–41)
Albumin: 1.7 g/dL — ABNORMAL LOW (ref 3.5–5.0)
Alkaline Phosphatase: 127 U/L — ABNORMAL HIGH (ref 38–126)
Anion gap: 10 (ref 5–15)
BUN: 79 mg/dL — ABNORMAL HIGH (ref 8–23)
CO2: 25 mmol/L (ref 22–32)
Calcium: 8.2 mg/dL — ABNORMAL LOW (ref 8.9–10.3)
Chloride: 106 mmol/L (ref 98–111)
Creatinine, Ser: 2.53 mg/dL — ABNORMAL HIGH (ref 0.61–1.24)
GFR calc Af Amer: 28 mL/min — ABNORMAL LOW (ref >60)
GFR calc non Af Amer: 24 mL/min — ABNORMAL LOW (ref >60)
Glucose, Bld: 116 mg/dL — ABNORMAL HIGH (ref 70–99)
Potassium: 4.9 mmol/L (ref 3.5–5.1)
Sodium: 141 mmol/L (ref 135–145)
Total Bilirubin: 5.7 mg/dL — ABNORMAL HIGH (ref 0.3–1.2)
Total Protein: 6 g/dL — ABNORMAL LOW (ref 6.5–8.1)

## 2019-10-02 LAB — CBC WITH DIFFERENTIAL/PLATELET
Abs Immature Granulocytes: 0.1 10*3/uL — ABNORMAL HIGH (ref 0.00–0.07)
Basophils Absolute: 0 10*3/uL (ref 0.0–0.1)
Basophils Relative: 0 %
Eosinophils Absolute: 0 10*3/uL (ref 0.0–0.5)
Eosinophils Relative: 0 %
HCT: 39.4 % (ref 39.0–52.0)
Hemoglobin: 13.5 g/dL (ref 13.0–17.0)
Immature Granulocytes: 1 %
Lymphocytes Relative: 9 %
Lymphs Abs: 1 10*3/uL (ref 0.7–4.0)
MCH: 34.7 pg — ABNORMAL HIGH (ref 26.0–34.0)
MCHC: 34.3 g/dL (ref 30.0–36.0)
MCV: 101.3 fL — ABNORMAL HIGH (ref 80.0–100.0)
Monocytes Absolute: 0.8 10*3/uL (ref 0.1–1.0)
Monocytes Relative: 8 %
Neutro Abs: 8.6 10*3/uL — ABNORMAL HIGH (ref 1.7–7.7)
Neutrophils Relative %: 82 %
Platelets: 64 10*3/uL — ABNORMAL LOW (ref 150–400)
RBC: 3.89 MIL/uL — ABNORMAL LOW (ref 4.22–5.81)
RDW: 17.5 % — ABNORMAL HIGH (ref 11.5–15.5)
WBC: 10.6 10*3/uL — ABNORMAL HIGH (ref 4.0–10.5)
nRBC: 0 % (ref 0.0–0.2)

## 2019-10-02 LAB — PROTIME-INR
INR: 1.5 — ABNORMAL HIGH (ref 0.8–1.2)
Prothrombin Time: 17.7 seconds — ABNORMAL HIGH (ref 11.4–15.2)

## 2019-10-02 LAB — LIPASE, BLOOD: Lipase: 70 U/L — ABNORMAL HIGH (ref 11–51)

## 2019-10-02 LAB — AMYLASE: Amylase: 117 U/L — ABNORMAL HIGH (ref 28–100)

## 2019-10-02 IMAGING — CT CT ABD-PELV W/O CM
2 of 4 series · 16 of 46 positions shown, 18 images · non-contrast
Comparison: HIDA scan [DATE], ultrasound [DATE]

CLINICAL DATA: Abdominal distension

EXAM:
CT ABDOMEN AND PELVIS WITHOUT CONTRAST
TECHNIQUE: Multidetector CT imaging of the abdomen and pelvis was performed
following the standard protocol without IV contrast.

[Series 3: ap without · axial · non-contrast · 0.95mm/px · z∈[-276,+199]mm · 13 of 109 slices shown, 15 images]
[im 7/109  soft-tissue]
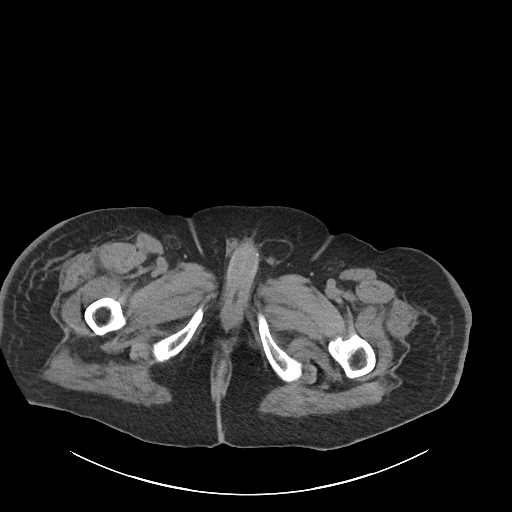
[im 7/109  bone]
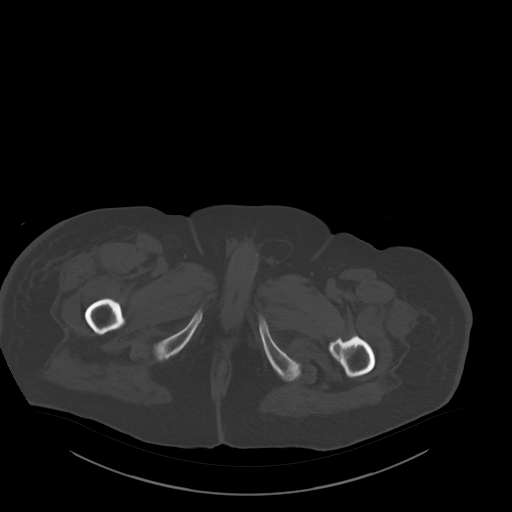
[im 13/109  soft-tissue]
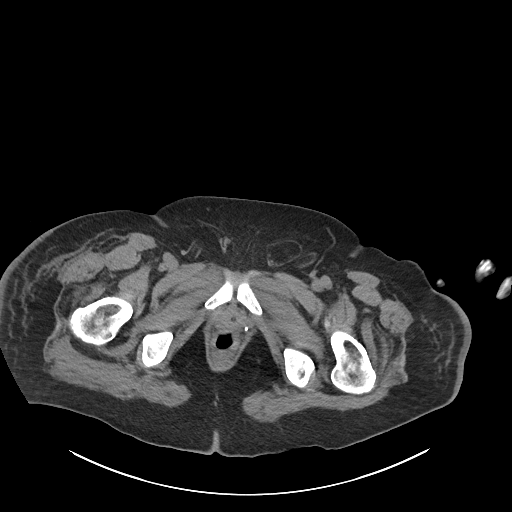
[im 26/109  soft-tissue]
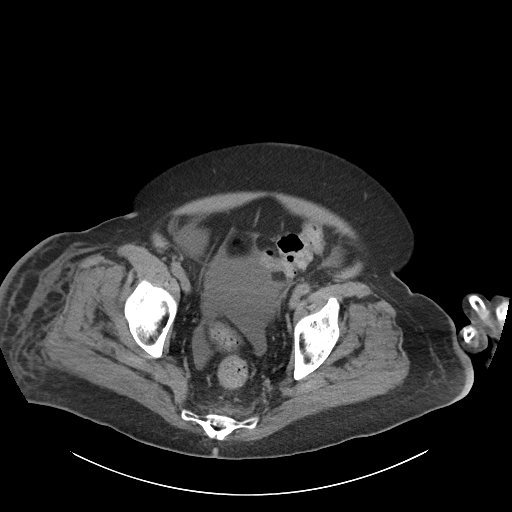
[im 32/109  soft-tissue]
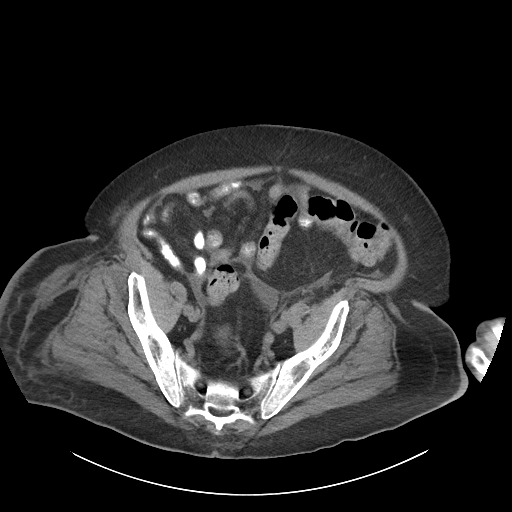
[im 39/109  soft-tissue]
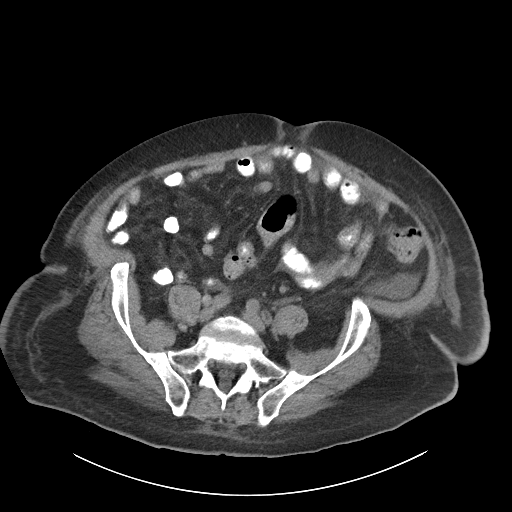
[im 45/109  soft-tissue]
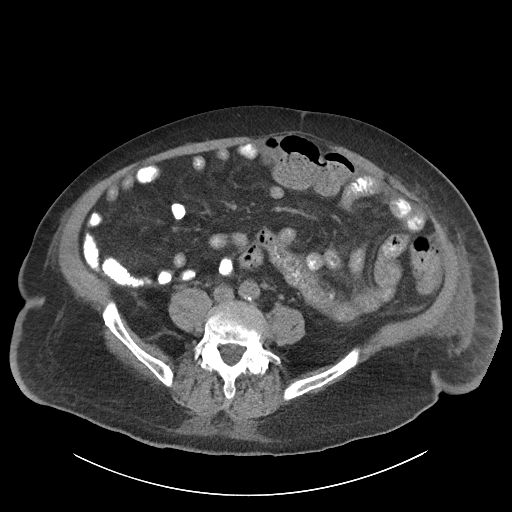
[im 58/109  soft-tissue]
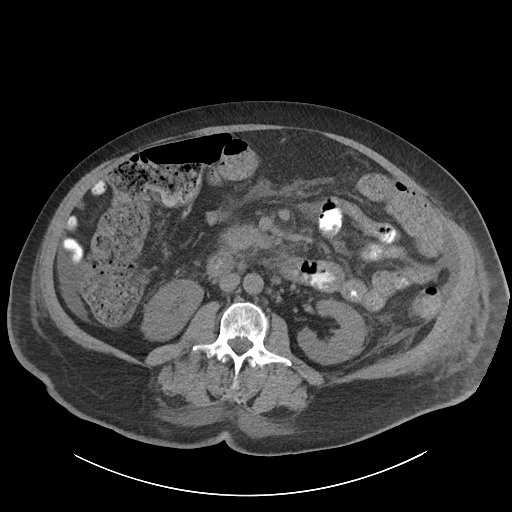
[im 64/109  soft-tissue]
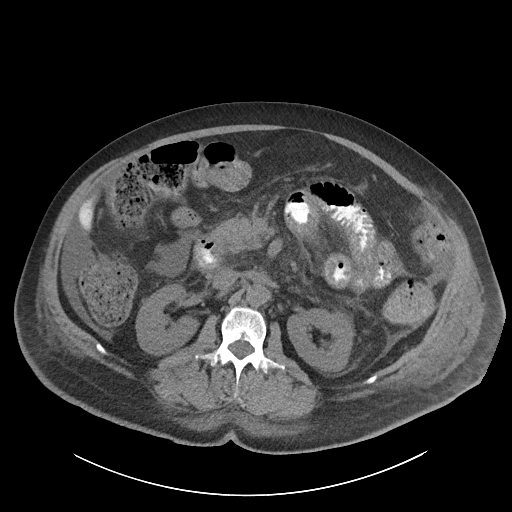
[im 70/109  soft-tissue]
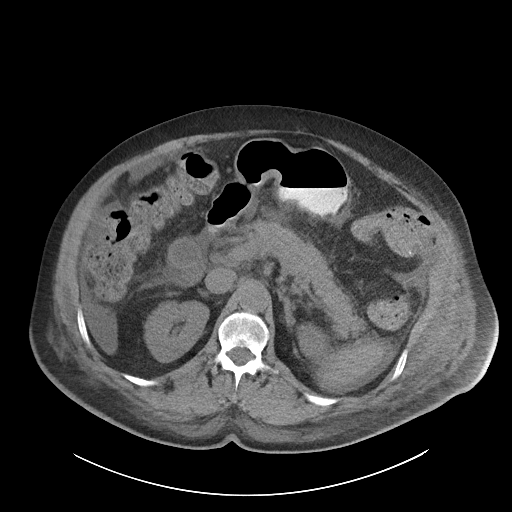
[im 70/109  bone]
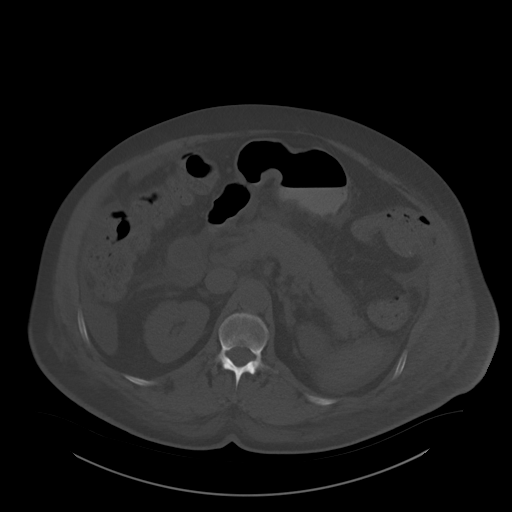
[im 77/109  soft-tissue]
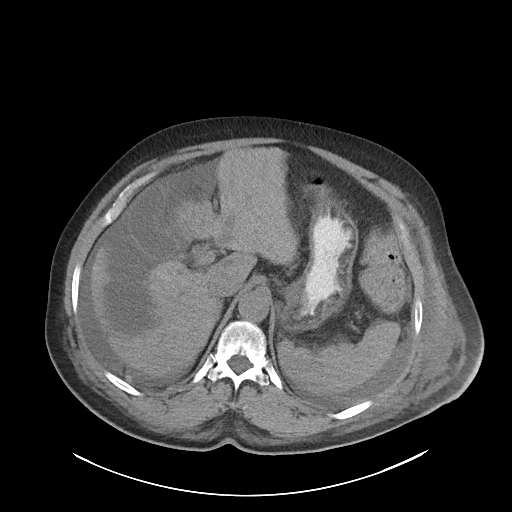
[im 83/109  soft-tissue]
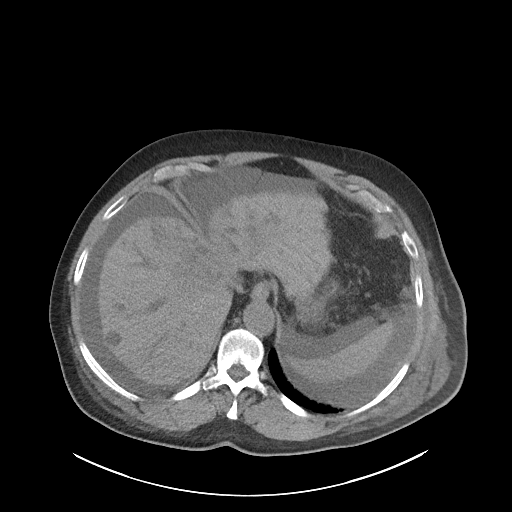
[im 96/109  soft-tissue]
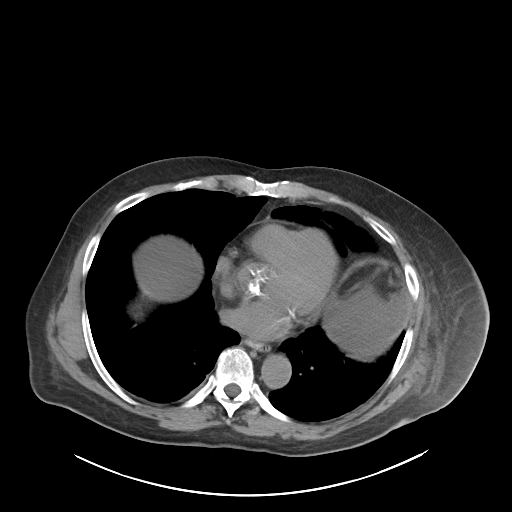
[im 102/109  soft-tissue]
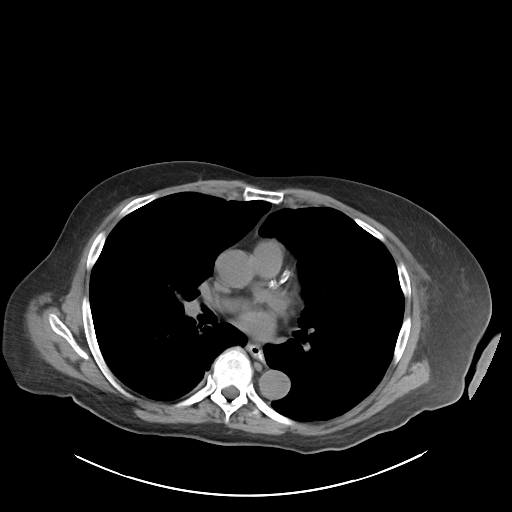

[Series 6: cor · coronal · 1.00mm/px · 3 of 115 slices shown]
[im 39/115  soft-tissue]
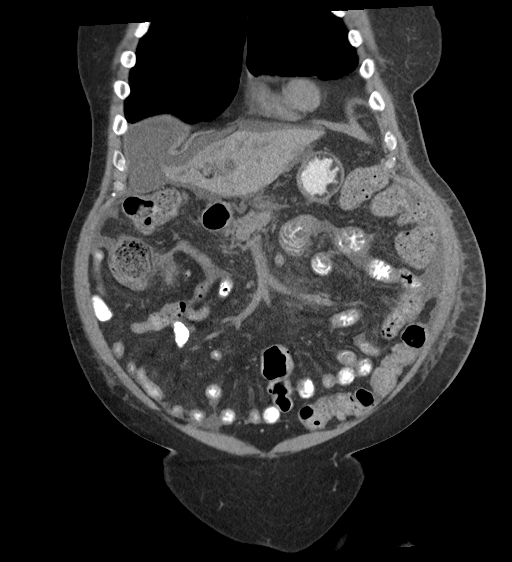
[im 51/115  soft-tissue]
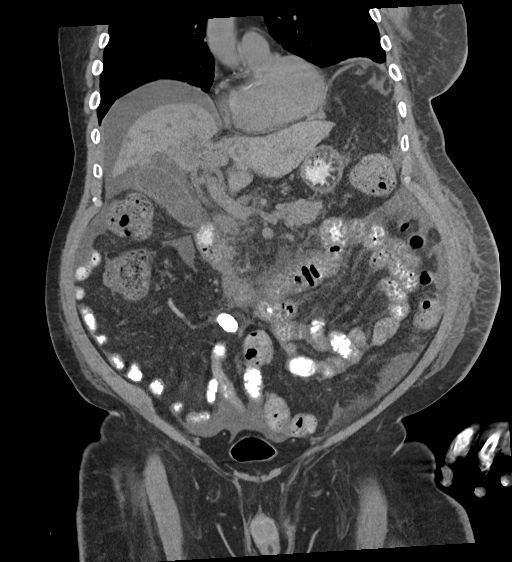
[im 64/115  soft-tissue]
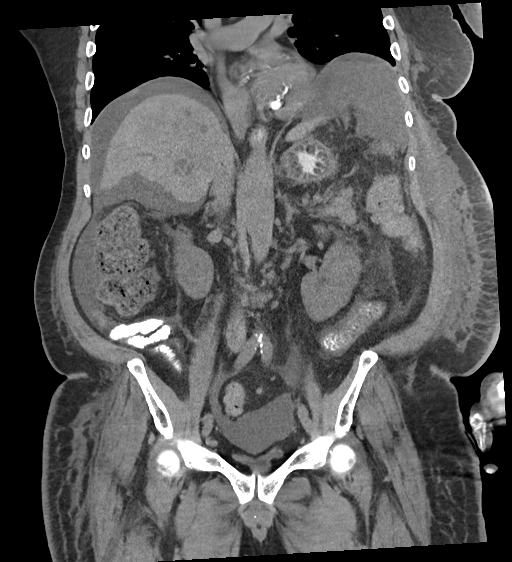

[16 of 46 positions shown; findings below may reference images not displayed]

FINDINGS: Lower chest: Lung bases are clear.

Hepatobiliary: There is intrahepatic biliary duct dilatation with
the LEFT and RIGHT hepatic lobe. There is hypoattenuation liver
parenchyma at the confluence of the LEFT and RIGHT bile duct systems
(image [DATE]). No clear common bile duct dilatation although the
common bile duct difficult to follow on this noncontrast exam.
Gallbladder is normal diameter at 2.2 cm.

Large volume intraperitoneal free fluid surrounding the liver. This
fluid is simple fluid attenuation.

Pancreas: No pancreatic duct dilatation. No pancreatic inflammation.

Spleen: Spleen is normal volume.

Adrenals/urinary tract: Adrenal glands and kidneys are normal. The
ureters and bladder normal. Bladder collapsed around Foley catheter.
The

Stomach/Bowel: Stomach, duodenum small-bowel normal. Terminal ileum
is normal. Appendix normal. Moderate volume stool in the ascending
transverse colon. Rectosigmoid colon normal.

Vascular/Lymphatic: Abdominal aorta is normal caliber with
atherosclerotic calcification. There is no retroperitoneal or
periportal lymphadenopathy. No pelvic lymphadenopathy.

Reproductive: Unremarkable

Other: RIGHT volume of free fluid surrounds the spleen as well as
the LEFT hepatic lobe collects in the pelvis.

Musculoskeletal: No aggressive osseous lesion.
IMPRESSION: 1. No evidence of bowel obstruction.
2. Intrahepatic biliary duct dilatation within LEFT and RIGHT
hepatic lobe with central low attenuation surrounding the confluence
of the ductal systems. Difficult interpretation with no IV contrast
however concern for centrally obstructing lesion such as
infiltrating cholangiocarcinoma. If patient cannot receive IV
contrast a MRI without contrast may provide further
characterization; however, MRIs on in-patients can be problematic
due to difficulty breath holding on lung sequences.
3. The common bile duct is not dilated. The pancreas appears normal.
4. Large volume of intraperitoneal free fluid is favored related to
hepatic dysfunction.

## 2019-10-02 MED ORDER — CHLORHEXIDINE GLUCONATE CLOTH 2 % EX PADS
6.0000 | MEDICATED_PAD | Freq: Two times a day (BID) | CUTANEOUS | Status: DC
  Administered 2019-10-02 – 2019-10-06 (×9): 6 via TOPICAL

## 2019-10-02 MED ORDER — TORSEMIDE 20 MG PO TABS
20.0000 mg | ORAL_TABLET | Freq: Every day | ORAL | Status: DC
  Administered 2019-10-02 – 2019-10-04 (×3): 20 mg via ORAL
  Filled 2019-10-02 (×3): qty 1

## 2019-10-02 NOTE — Progress Notes (Addendum)
Call made to [facility] on call NP for clarification on the patients NPO status. Zella Ball spoke with Michaelle Birks MD. Who recommended keeping the patient NPO with the exception of ice chips.

## 2019-10-02 NOTE — Progress Notes (Addendum)
[facility] PHYSICAL MEDICINE & REHABILITATION PROGRESS NOTE   Subjective/Complaints: Patient seen transferring was total assist sit to supine with therapies this morning.  Discussed transfers with therapies.  Patient states he did not stay well overnight due to "pain all over".  He notes improvement this morning.  X-ray reviewed showing cecal abnormality, discussed with GI, will also recommended surgery consultation.  Discussed with patient liver disease, patient states that he saw specialist but never followed up.  He was seen by nephrology yesterday, notes reviewed-plan to restart furosemide.  He was seen by ophthalmology yesterday, notes reviewed-recommendations for eyedrops and eye shield at all times with likely plan for enucleation/evisceration as outpatient.  ROS: Denies CP, SOB, N/V/D  Objective:    DG Abd 1 View  Result Date: 10/01/2019 CLINICAL DATA:  Abdomen distension EXAM: ABDOMEN - 1 VIEW COMPARISON:  None. FINDINGS: Air distension of what appears to be cecum in the right mid abdomen up to 14.8 cm. Remainder of the gas pattern is unremarkable. No radiopaque calculi are visualized. IMPRESSION: Air distension of what appears to be cecum in the right mid abdomen up to 14.8 cm, raising possibility of cecal bascule/cecal obstruction. CT may be considered for further evaluation. These results will be called to the ordering clinician or representative by the Radiologist Assistant, and communication documented in the PACS or [employer]. Electronically Signed   By: Donavan Foil M.D.   On: 10/01/2019 16:25   NM Hepatobiliary Liver Func  Result Date: 09/30/2019 CLINICAL DATA:  Chronic upper abdominal pain, gallbladder wall thickening on ultrasound, assess gallbladder motility EXAM: NUCLEAR MEDICINE HEPATOBILIARY IMAGING TECHNIQUE: Sequential images of the abdomen were obtained out to 60 minutes following intravenous administration of radiopharmaceutical. Due to nonvisualization of the  gallbladder, 3 mg of morphine was administered and imaging was continued for an additional 30 minutes. RADIOPHARMACEUTICALS:  5.3 mCi Tc-66m  Choletec IV COMPARISON:  Ultrasound abdomen 09/27/2019 FINDINGS: Delayed clearance of tracer from bloodstream indicating impaired hepatocellular function. Abnormal appearance of liver with inferior position and discontiguous appearance of the lateral segment LEFT lobe versus remainder of liver, question hepatic anomaly. Significant retention of tracer within hepatic parenchyma as well as within dilated central biliary tree of the lateral segment LEFT lobe. Delayed visualization of CBD. No definite bowel activity is identified by the conclusion of the exam. Gallbladder is probably visualized just before morphine administration with increased tracer accumulation in probable gallbladder following morphine. 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 Visualization of central biliary radicles, probably gallbladder, and CBD. No small bowel activity is identified to confirm CBD patency; in light of the biliary dilatation identified by ultrasound, recommend assessment by MR imaging with MRCP imaging to evaluate. If patient is unable to undergo MR imaging, recommend CT assessment, with IV contrast if renal function permits. Electronically Signed   By: Lavonia Dana M.D.   On: 09/30/2019 14:25   Recent Labs    10/01/19 0452 10/02/19 0753  WBC 9.6 10.6*  HGB 13.2 13.5  HCT 38.6* 39.4  PLT 73* 64*   Recent Labs    09/30/19 0429 10/01/19 0452  NA 141 144  K 5.1 5.0  CL 108 108  CO2 24 26  GLUCOSE 118* 132*  BUN 82* 86*  CREATININE 2.91* 2.98*  CALCIUM 8.4* 8.5*    Intake/Output Summary (Last 24 hours) at 10/02/2019 1042 Last data filed at 10/02/2019 0800 Gross per 24 hour  Intake 840 ml  Output 1825 ml  Net -985 ml        Physical Exam: Vital Signs Blood pressure (!)  142/94, pulse 84, temperature 97.8 F (36.6 C), resp. rate 20, weight 107 kg, SpO2 94 %. Constitutional: No distress . Vital signs reviewed. HENT: Normocephalic.  Atraumatic. Eyes: EOMI. No discharge. Cardiovascular: No JVD.  RRR. Respiratory: Normal effort.  No stridor.  Bilateral clear to auscultation. GI: Non-distended.  BS +. Skin: Warm and dry.  Intact. Vascular changes bilateral lower extremities Psych: Flat. Musc: Generalized edema Left periorbital edema Neuro: Alert Motor: RUE/RLE: 3+/5 proximal distal LUE: Limited due to significant tone/rigidity  LLE: 3 -/5 proximal distal, unchanged Left lower extremity clonus  Assessment/Plan: 1. Functional deficits secondary to Parkinson's, with recent anterior left globe rupture which require 3+ hours per day of interdisciplinary therapy in a comprehensive inpatient rehab setting.  Physiatrist is providing close team supervision and 24 hour management of active medical problems listed below.  Physiatrist and rehab team continue to assess barriers to discharge/monitor patient progress toward functional and medical goals  Care Tool:  Bathing    Body parts bathed by patient: Right arm, Left arm, Chest, Abdomen, Face   Body parts bathed by helper:  (declined LB bathing at time of eval)     Bathing assist Assist Level: Total Assistance - Patient < 25%     Upper Body Dressing/Undressing Upper body dressing   What is the patient wearing?: Hospital gown only    Upper body assist Assist Level: Maximal Assistance - Patient 25 - 49%    Lower Body Dressing/Undressing Lower body dressing      What is the patient wearing?: Pants     Lower body assist Assist for lower body dressing: 2 Helpers     Toileting Toileting Toileting Activity did not occur (Clothing management and hygiene only):  (not performed at time of eval, has Foley for urine and no need for BM at time of eval)  Toileting assist Assist for toileting: Dependent -  Patient 0%     Transfers Chair/bed transfer  Transfers assist     Chair/bed transfer assist level: 2 Helpers     Locomotion Ambulation   Ambulation assist   Ambulation activity did not occur: Safety/medical concerns (fatigue/weakness)          Walk 10 feet activity   Assist  Walk 10 feet activity did not occur: Safety/medical concerns (fatigue/weakness)        Walk 50 feet activity   Assist Walk 50 feet with 2 turns activity did not occur: Safety/medical concerns (fatigue/weakness)         Walk 150 feet activity   Assist Walk 150 feet activity did not occur: Safety/medical concerns (fatigue/weakness)         Walk 10 feet on uneven surface  activity   Assist Walk 10 feet on uneven surfaces activity did not occur: Safety/medical concerns (fatigue/weakness)         Wheelchair     Assist Will patient use wheelchair at discharge?: Yes Type of Wheelchair: Manual    Wheelchair assist level: Dependent - Patient 0% (tilt in space wc does not allow for patient to propel himself)      Wheelchair 50 feet with 2 turns activity    Assist        Assist Level: Dependent - Patient 0% (tilt in space wc does not allow for patient to propel himself)   Wheelchair 150 feet activity     Assist      Assist Level:  Maximal Assistance - Patient 25 - 49%   Medical Problem List and Plan: 1.Functional deficitssecondary to Parkinson's Gait disorder. Recent fall with rupture of anterior left globe  Continue CIR  Team conference today to discuss current and goals and coordination of care, home and environmental barriers, and discharge planning with nursing, case manager, and therapies. Please see conference note from today as well.  2. Antithrombotics: -DVT/anticoagulation:Mechanical:Antiembolism stockings, knee (TED hose) Bilateral lower extremities Sequential compression devices, below kneeBilateral lower  extremities -antiplatelet therapy: n/a 3.Abdominal pain/Pain Management:Continue oxycodoneprn  Appears to be controlled with meds on 9/29  Monitor with increased exertion 4. Mood:team to provide ego support -antipsychotic agents: n/a 5. Neuropsych: This patient is?  Not fully capable of making decisions on his own behalf. 6. Skin/Wound Care:local care as indicated 7. Fluids/Electrolytes/Nutrition:encourage appropriate po 8. Ruptured globe left eye s/p surgical repair 9/16 by ophthalmology Dr. Eulas Post -eye shield at all times -broad spectrum coverage transitioned tolevofloxacin and doxycyclinefor 1 additional day -continueeye gttsfor 2 additional weeks(~10/12) -outpt f/u with Dr. Eulas Post after discharge 9. HTN: -pt on lisinopril, amlodipine, propranolol at home -Coreg started on 9/27   Relatively controlled on 9/29  Moderate increased mobility  10. HUD:[id number], likely related toATN v/svancomycin Mirabegron ER and finasteride resumed  Creatinine 2.98 on 9/28  Continue to monitor 11.Parkinson's Disease Sinemet added recently but not started per notes. Pt followed by Dr. Tomi Likens  Will consider initiation after completion of GI/surgery work-up 12. Hyponatremia, mild SIADH: Resolved 13. Prolonged QT interval: Avoid rate prolongation meds  ECG reviewed, showing persistent prolonged QTC 14. Constipation Senna-s two tabs at HS  Abdominal x-ray personally reviewed-cecal abnormality. CT abd/pelvis ordered.  Await further GI/surgery recs  Adjust bowel meds as necessary  Constipation improving 15.Distended abdomen HIDA scan negative.Some chronic findings due to abnl small bowel motility   LFTs elevated on 9/28, continue to monitor  See #14 16.  Hyperkalemia  Potassium 5.0 on 9/28  Continue Lokelma  Continue to  monitor 17.  Hypoalbuminemia  Supplement initiated on 9/28 18.  Spasticity versus rigidity  See #11  Will consider neck MRI 19.  Dysphagia  D2 thins, advance diet as tolerated 20.  Leukocytosis  WBCs 10.6 on 9/29  Afebrile  Continue to monitor  LOS: 2 days A FACE TO FACE EVALUATION WAS PERFORMED  Yuvan Medinger Lorie Phenix 10/02/2019, 10:42 AM

## 2019-10-02 NOTE — Progress Notes (Signed)
Patient ID: Nicholas Ramirez, male   DOB: 1945-11-20, 74 y.o.   MRN: [id number]  Met with pt and spoke with wife-Anna via telephone to discuss team conference goals min-mod level and target discharge date 10/21. Wife had many questions she spoke with Ed-bedside RN to get answered. Discussed his need for 24 hr care at discharge for safety also. According to daughter-Deanna her Mom can not provide the care he needs that is the reason he continues to fall but they can't be without one another either. Will need to discuss with both the best plan for pt at discharge. Deanna would like him to go to her home. Pt will need to have a say also. Will continue to work on best and safest discharge plan for all of them.

## 2019-10-02 NOTE — Progress Notes (Signed)
I reviewed the patient's CT scan that was completed this evening. Final report is pending, however the cecum appears nondilated with no signs of volvulus, no pneumatosis. Remainder of the colon and small bowel are decompressed as well with no evidence of bowel obstruction. Patient does have moderate ascites and intrahepatic biliary ductal dilation. No indication for acute surgical intervention at this time. Continue serial abdominal exams.  Michaelle Birks, [place] [facility], Hepatobiliary and Pancreatic Surgery 10/02/19 6:30 PM

## 2019-10-02 NOTE — Progress Notes (Signed)
Occupational Therapy Session Note  Patient Details  Name: Nicholas Ramirez MRN: [id number] Date of Birth: Feb 25, 1945  Today's Date: 10/02/2019 OT Individual Time: 8413-2440 OT Individual Time Calculation (min): 71 min    Short Term Goals: Week 1:  OT Short Term Goal 1 (Week 1): Pt will transition sit to stand with most appropriate AD with Max of 1 to assist with LB ADLs OT Short Term Goal 2 (Week 1): Pt will perform static stands for 1-3 minutes with no more than Mod Ax1  for upright standing OT Short Term Goal 3 (Week 1): Pt will need no more than Min verbal cues to decrease Left lateral lean and orient to midline OT Short Term Goal 4 (Week 1): Pt will perform UB dress with Mod A  Skilled Therapeutic Interventions/Progress Updates:    Treatment session with focus on functional transfers, sit > stand, standing tolerance, and functional movements in BUE.  Pt received on bedpan upon arrival.  Pt agreeable to attempting BM on BSC.  Completed bed mobility with max +2 to come to EOB.  Sit > stand from elevated EOB into St[place] with mod +2.  Transferred to [facility] via Stedy.  Therapist providing multimodal cues for trunk control and midline sitting posture while on toilet.  Pt able to have successful BM on BSC.  Engaged in sit > stand max - total +2 in [place] with pt unable to achieve full upright stand despite use of Stedy and multimodal cues/assistance.  Completed squat pivot transfer from drop arm BSC > w/c due to inability to stand up enough to place Stedy flaps for transfer, however given increased time and facilitation of weight shift pt able to initiate transfer to w/c.  Utilized tilt in space w/c function to tilt back to allow for improved positioning in w/c.  While tilted back engaged in self-ROM with pt utilizing RUE to facilitate shoulder flexion and elbow extension of LUE.  Also utilized gravity to engage in passive stretch to achieve more elbow extension. Pt returned to upright in w/c.  Encouraged pt  to utilize RUE to hold on to cup and bring to mouth to drink contrast for CT scan.  While drinking contrast, therapist encouraged pt to increase participation in self-feeding as nurse tech has been feeding him.  Pt in agreement.  Wife present throughout session and providing incite to pt prior level of function and providing encouragement to pt.  Pt remained upright in TIS w/c with seat belt alarm on and all needs in reach.  Therapy Documentation Precautions:  Precautions Precautions: Fall, Other (comment) (L eye shield all times) Precaution Comments: pt admitted for a fall, daughter reports 15-20 in last 6 months Restrictions Weight Bearing Restrictions: No General:   Vital Signs: Therapy Vitals Temp: 98.5 F (36.9 C) Pulse Rate: (!) 108 Resp: 14 BP: (!) 119/91 Patient Position (if appropriate): Lying Oxygen Therapy SpO2: 97 % Pain:  Pt with c/o pain in BLE with standing attempts, repositioned.   Therapy/Group: Individual Therapy  Simonne Come 10/02/2019, 4:23 PM

## 2019-10-02 NOTE — Progress Notes (Signed)
KUB results reviewed--note was read out 16:25 pm but not called. Patient reports abdominal pain is better this am and up in bed eating breakfast. Per tech --patient had medium semi-hard BM this am.  GI consulted for input on bascule and downward trend in platelets--per records review patient has Hep C and has been on transplant list at [facility] but not seen for 18 months. CT abdomen/pelvis ordered--OK to use oral contrast per Nephrology. Question need for MI and will leave to GI. General surgery consulted per GI recommendations.

## 2019-10-02 NOTE — Progress Notes (Signed)
Physical Therapy Session Note  Patient Details  Name: Nicholas Ramirez MRN: [id number] Date of Birth: 22-Nov-1945  Today's Date: 10/02/2019 PT Individual Time: 2633-3545 PT Individual Time Calculation (min): 41 min   Short Term Goals: Week 1:  PT Short Term Goal 1 (Week 1): Patient will remain sitting with R UE support for >2 mins with no more than MinA x1 PT Short Term Goal 2 (Week 1): Patient will transfer bed<> wc with no more than ModA x2 PT Short Term Goal 3 (Week 1): Patient will be able to complete bed mobility with MaxA x1  Skilled Therapeutic Interventions/Progress Updates:   Received pt supine in bed asleep, upon wakening pt agreeable to therapy but very lethargic, and reported 8/10 pain in L eye and in stomach. RN made aware and determining what medication pt is able to have since his is now NPO, but present to administer medication at end of session. Pt with increased fatigue throughout session with eyes closed during ~95% of session. Pt stated he did not sleep well last night as staff was in and out of his room. Session with emphasis on functional mobility/transfers, generalized strengthening, dynamic standing balance/coordination, and improved activity tolerance. Pt rolled to R with max A +2 and transferred supine<>sitting EOB max A +2 for trunk control and LE management. Pt required mod/max A of 1 to maintain static sitting balance and max cues to open eyes. Noted pt with strong L lateral lean sitting EOB and when sitting in [place]. Pt transferred sit<>stand in [place] max A+2. Pt required cues for anterior weight shifting, hand placement, and to keep eyes open. Pt performed x2 additional sit<>stands from semi-standing position from [facility] flaps with mod A +2 and able to remain standing ~10 seconds prior to sitting. Worked on static sitting balance and midline orientation to correct L lateral lean however pt with poor ability to follow commands due to fatigue. Pt transferred sit<>supine max A  +2 and required total A +2 and use of Trendelenburg position to reposition in bed. Pt performed bilateral heel slides x10 on R LE and x7 on L LE. Pt required increased time and with decreased knee flexion bilaterally. Noted tremors in L LE with heel slides and in L UE when standing in [place]. Concluded session with pt supine in bed, needs within reach, soft call bell in reach, and all bedrails up.   Therapy Documentation Precautions:  Precautions Precautions: Fall, Other (comment) (L eye shield all times) Precaution Comments: pt admitted for a fall, daughter reports 15-20 in last 6 months Restrictions Weight Bearing Restrictions: No  Therapy/Group: Individual Therapy Alfonse Alpers PT, DPT   10/02/2019, 7:26 AM

## 2019-10-02 NOTE — Progress Notes (Signed)
Patient ID: Nicholas Ramirez, male   DOB: 1945-03-26, 74 y.o.   MRN: [id number] [facility] Progress Note   Assessment/ Plan:   1. Acute kidney Injury: nonoliguric and with seemingly plateaued renal function overnight.  Etiology of acute kidney injury is thought to be multifactorial but predominantly hemodynamically mediated by hypotension possibly with evolution to ATN in the setting of ongoing HCTZ/ACE inhibitor and transient urine retention.  Renal function relatively unchanged overnight with decent urine output-I will start him on torsemide 20 mg daily. 2.  Hyperkalemia: Mild, status post Lokelma earlier today and will begin loop diuretic. 3.  Hypertension: Blood pressures marginally elevated on low-dose carvedilol-monitor response to beginning diuretic. 4.  Ruptured globe left eye: Secondary to mechanical fall/trauma and status post surgical repair.  On antibiotics-levofloxacin and doxycycline. 5.  Recurrent falls/recent diagnosis of Parkinson disease.  Admitted to CIR for ongoing rehabilitation efforts. 6.  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  CBD patency could not be confirmed and additional evaluation recommended.  Subjective:   Denies any acute events overnight and continues to complain of abdominal discomfort (discussed with Reesa Chew PA-appropriate to undertake CT scan of the abdomen with oral contrast).   Objective:   BP (!) 142/94   Pulse 84   Temp 97.8 F (36.6 C)   Resp 20   Wt 107 kg   SpO2 94%   BMI 34.85 kg/m   Intake/Output Summary (Last 24 hours) at 10/02/2019 0902 Last data filed at 10/01/2019 2030 Gross per 24 hour  Intake 540 ml  Output 950 ml  Net -410 ml   Weight change:   Physical Exam: Gen: Sitting up in a wheelchair working with PT/OT.  Wearing shield over left eye. CVS:  Pulse regular tachycardia, S1 and S2 normal Resp: Poor inspiratory effort with decreased breath sounds over bases, no distinct rales Abd: Soft, obese, nontender Ext: 2+-3+ lower extremity edema.  Imaging: DG Abd 1 View  Result Date: 10/01/2019 CLINICAL DATA:  Abdomen distension EXAM: ABDOMEN - 1 VIEW COMPARISON:  None. FINDINGS: Air distension of what appears to be cecum in the right mid abdomen up to 14.8 cm. Remainder of the gas pattern is unremarkable. No radiopaque calculi are visualized. IMPRESSION: Air distension of what appears to be cecum in the right mid abdomen up to 14.8 cm, raising possibility of cecal bascule/cecal obstruction. CT may be considered for further evaluation. These results will be called to the ordering clinician or representative by the Radiologist Assistant, and communication documented in the PACS or [employer]. Electronically Signed   By: Donavan Foil M.D.   On: 10/01/2019 16:25   NM Hepatobiliary Liver Func  Result Date: 09/30/2019 CLINICAL DATA:  Chronic upper abdominal pain, gallbladder wall thickening on ultrasound, assess gallbladder motility EXAM: NUCLEAR MEDICINE HEPATOBILIARY IMAGING TECHNIQUE: Sequential images of the abdomen were obtained out to 60 minutes following intravenous administration of radiopharmaceutical. Due to nonvisualization of the gallbladder, 3 mg of morphine was administered and imaging was continued for an additional 30 minutes. RADIOPHARMACEUTICALS:  5.3 mCi Tc-59m  Choletec IV COMPARISON:  Ultrasound abdomen 09/27/2019 FINDINGS: Delayed clearance of tracer from bloodstream indicating impaired hepatocellular function. Abnormal appearance of liver with inferior position and discontiguous appearance of the lateral segment LEFT lobe versus remainder of liver, question hepatic anomaly. Significant retention of tracer within hepatic parenchyma as well as within dilated central biliary tree  of the lateral segment LEFT lobe. Delayed  visualization of CBD. No definite bowel activity is identified by the conclusion of the exam. Gallbladder is probably visualized just before morphine administration with increased tracer accumulation in probable gallbladder following morphine. 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 Visualization of central biliary radicles, probably gallbladder, and CBD. No small bowel activity is identified to confirm CBD patency; in light of the biliary dilatation identified by ultrasound, recommend assessment by MR imaging with MRCP imaging to evaluate. If patient is unable to undergo MR imaging, recommend CT assessment, with IV contrast if renal function permits. Electronically Signed   By: Lavonia Dana M.D.   On: 09/30/2019 14:25    Labs: BMET Recent Labs  Lab 09/26/19 1045 09/27/19 1050 09/28/19 0354 09/29/19 1001 09/30/19 0429 10/01/19 0452  NA 137 137 139 143 141 144  K 5.0 5.3* 6.0* 5.1 5.1 5.0  CL 107 106 107 110 108 108  CO2 19* 22 24 24 24 26   GLUCOSE 170* 150* 120* 128* 118* 132*  BUN 89* 88* 86* 78* 82* 86*  CREATININE 4.04* 3.51* 3.29* 2.95* 2.91* 2.98*  CALCIUM 8.3* 8.1* 8.1* 8.4* 8.4* 8.5*  PHOS 6.0* 5.4*  --  5.3*  --   --    CBC Recent Labs  Lab 09/27/19 1050 09/27/19 1050 09/28/19 0821 09/29/19 1001 10/01/19 0452 10/02/19 0753  WBC 8.5  --   --  11.2* 9.6 10.6*  NEUTROABS  --   --   --   --  8.0* 8.6*  HGB 12.0*   < > 12.6* 13.5 13.2 13.5  HCT 33.8*   < > 36.1* 39.8 38.6* 39.4  MCV 98.8  --   --  101.0* 101.8* 101.3*  PLT 94*  --   --  96* 73* 64*   < > = values in this interval not displayed.    Medications:    . (feeding supplement) PROSource Plus  30 mL Oral BID BM  . carvedilol  3.125 mg Oral BID WC  . doxycycline  100 mg Oral Q12H  . DULoxetine  60 mg Oral Daily  . feeding supplement (ENSURE ENLIVE)  237 mL Oral BID BM  . finasteride  5 mg Oral Daily  . gatifloxacin   1 drop Left Eye QID  . hydrocortisone sod succinate (SOLU-CORTEF) inj  50 mg Intravenous Q8H   Followed by  . [START ON 10/03/2019] hydrocortisone sod succinate (SOLU-CORTEF) inj  12.5 mg Intravenous Q8H  . levofloxacin  500 mg Oral Q48H  . loratadine  10 mg Oral Daily  . mirabegron ER  50 mg Oral Daily  . neomycin-polymyxin b-dexamethasone  1 application Left Eye QID  . nystatin  5 mL Oral QID  . polyethylene glycol  17 g Oral BID  . prednisoLONE acetate  1 drop Left Eye QID  . senna-docusate  1 tablet Oral BID  . sodium zirconium cyclosilicate  10 g Oral Daily   Elmarie Shiley, MD 10/02/2019, 9:02 AM

## 2019-10-02 NOTE — Consult Note (Addendum)
Referring Provider: Dr. Delice Lesch  Primary Care Physician:  Nona Dell, Corene Cornea, MD Primary Gastroenterologist:  Althia Forts   Reason for Consultation: Abdominal distension   HPI: Nicholas Ramirez is a 74 y.o. 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    He was admitted to [facility] on 09/18/2019 after he tripped and fell which resulted in a left eye injury.  He reported having fatigue and balance issues for 2 weeks. He underwent surgery by Dr. Eulas Post, s/p repair of open globe, scleral laceration with resection of prolapsed uveal tissue, left eye.  He developed AKI post operatively. He was evaluated by nephrology who assessed he AKI was secondary to ATN with hypotension and poor po intake.  Noted to have hyponatremia with sodium level 1 23-1 26, possible concerns for  adrenal insufficiency without  evidence of SIADH.   He developed central abdominal pain 2 days ago. An abdominal xray 10/01/2019 which was concerning for a possible cecal bascule or cecal obstruction.  He was evaluated by general surgery, who thought he  might have a volvulus,  pseudoobstruction or distal obstruction. An abdominal/plevic CT scan was ordered, not yet completed. A GI consult was requested for further evaluation as well.   Currently, he is resting in bed. He is NPO. He reports having central abdominal which started 2 days ago. No nausea or vomiting. Appetite has been fair. His RN Ed reported the patient passed 3 soft to formed brown stools today. No rectal bleeding or black stools. He denies having any history of GERD. He possibly had a colonoscopy 10 years ago but the details are unclear. He has intermittent confusion.  No alcohol or tobacco use for 20 years. No family history of gastric or colon cancer.   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 He stated his  hepatitis C was treated at [facility]. Care everywhere records showed he was treated with  Linzie Collin XR 01/2015 - 03/2015 at [facility]. He relapsed and was treated with [facility] 04/2016.  He underwent an EGD 12/19/2017  By Dr. Dorcas Mcmurray which was normal, no evidence of esophageal or gastric varices. An abdominal MRI with and without contrast 12/29/2017 was unchanged from prior image studies, no suspicious liver lesions were identified. It appears that he was lost to follow up since late 2019.   During his current hospital admission he underwent additional abdominal imaging. A right upper quadrant ultrasound 09/27/2019 was suspicious for cirrhosis with a distended gallbladder with edematous gallbladder wall thickening without evidence of gallstones or sludge. Intrahepatic biliary ductal dilatation with upper normal common bile duct was noted. Small amount of right upper quadrant ascites was present. A HIDA scan was done which showed a potential hepatic developmental anomaly, no small bowel activity is identified to confirm CBD patency therefore an MRI/MRCP was recommended.   Labs 10/01/2019: Sodium 144. Potassium 5. Glucose 132. BUN 86. Creatinine 2.98. Calcium 8.5. Alk phos 137. Albumin 1.7. AST 106. ALT 97. Total bili 5.1. WBC 9.6. Hemoglobin 13.2. Hematocrit 38.6. MCV 101.8. Platelets 73.  Abdominal xray 10/01/2019:  Air distension of what appears to be cecum in the right mid abdomen up to 14.8 cm. Remainder of the gas pattern is unremarkable. No radiopaque calculi are visualized. IMPRESSION: Air distension of what appears to be cecum in the right mid abdomen up to 14.8 cm, raising possibility of cecal bascule/cecal obstruction.  CT may be considered for further evaluation.  RUQ ultrasound 09/27/2019: 1. Distended gallbladder with edematous gallbladder wall thickening. No gallstones or sludge. Findings may be related to underlying chronic liver disease with ascites in the right upper quadrant. If there is clinical  concern for acute cholecystitis, recommend further evaluation with nuclear medicine HIDA scan. 2. Intrahepatic biliary ductal dilatation with upper normal common bile duct. 3. Heterogeneous hepatic parenchyma with increased echogenicity and suggestion of capsular nodularity. Findings suspicious for cirrhosis. Liver lesions on 2016 ultrasound are not well seen on the current exam. Other: Small volume right upper quadrant ascites and pericholecystic Fluid.  HIDA scan 09/30/2019: Potential hepatic developmental anomaly with discontinuous lateral segment LEFT lobe liver, slightly inferiorly positioned. Visualization of central biliary radicles, probably gallbladder, and CBD. No small bowel activity is identified to confirm CBD patency; in light of the biliary dilatation identified by ultrasound, recommend assessment by MR imaging with MRCP imaging to evaluate. If patient is unable to undergo MR imaging, recommend CT assessment, with IV contrast if renal function permits.  Elastography 12/18/2014: 1. 1.5 cm hyperechoic lesion in the liver is technically nonspecific. Although possibly a hemangioma, this did not demonstrate definite enhanced through transmission to further solidified confidence. Given the history of hepatitis, I recommend hepatic protocol MRI with and without contrast for definitive characterization. There are also several benign appearing hepatic cysts. Median hepatic shear wave velocity is calculated at 2.64 m/sec. Corresponding Metavir fibrosis score is Some F3+F4. Risk of fibrosis is high.  Past Medical History:  Diagnosis Date  . Asthma   . Depression   . Heart murmur   . Hepatitis C   . Hypertension     Past Surgical History:  Procedure Laterality Date  . CARPAL TUNNEL RELEASE Left   . RUPTURED GLOBE EXPLORATION AND REPAIR Left 09/18/2019   Procedure: REPAIR OF RUPTURED GLOBE;  Surgeon: Lonia Skinner, MD;  Location: [place];  Service: Ophthalmology;   Laterality: Left;    Prior to Admission medications   Medication Sig Start Date End Date Taking? Authorizing Provider  aspirin 81 MG EC tablet Take 81 mg by mouth daily.    [provider]  carbidopa-levodopa (SINEMET IR) 25-100 MG tablet Take 1/2 tablet in AM and bedtime for one week, then 1/2 tablet three times daily for one week, then take 1 tablet three times daily. 09/16/19   Pieter Partridge, DO  cetirizine (ZYRTEC) 10 MG tablet Take 10 mg by mouth daily.    [provider]  doxycycline (VIBRA-TABS) 100 MG tablet Take 1 tablet (100 mg total) by mouth every 12 (twelve) hours for 3 days. 09/30/19 10/03/19  Amin, Jeanella Flattery, MD  DULoxetine (CYMBALTA) 60 MG capsule Take 60 mg by mouth daily. 07/03/19   [provider]  finasteride (PROSCAR) 5 MG tablet Take 5 mg by mouth daily. 07/03/19   [provider]  gatifloxacin (ZYMAXID) 0.5 % SOLN Place 1 drop into the left eye 4 (four) times daily for 15 days. 09/30/19 10/15/19  Amin, Jeanella Flattery, MD  levofloxacin (LEVAQUIN) 500 MG tablet Take 1 tablet (500 mg total) by mouth every other day for 3 days. 10/01/19 10/04/19  Amin, Ankit Chirag, MD  MYRBETRIQ 50 MG TB24 tablet Take 50 mg by mouth daily. 09/02/19   [provider]  neomycin-polymyxin b-dexamethasone (MAXITROL) 3.5-10000-0.1 OINT Place 1 application into the left eye 4 (four) times daily for 15 days. 09/30/19 10/15/19  Amin, Jeanella Flattery, MD  nystatin (MYCOSTATIN) 100000 UNIT/ML suspension Take  5 mLs (500,000 Units total) by mouth 4 (four) times daily for 7 days. 09/30/19 11/02/2019  Amin, Jeanella Flattery, MD  polyethylene glycol (MIRALAX / GLYCOLAX) 17 g packet Take 17 g by mouth 2 (two) times daily. 09/30/19   Amin, Jeanella Flattery, MD  prednisoLONE acetate (PRED FORTE) 1 % ophthalmic suspension Place 1 drop into the left eye 4 (four) times daily for 15 days. 09/30/19 10/15/19  Amin, Jeanella Flattery, MD  propranolol (INDERAL) 40 MG tablet Take 40 mg by mouth 2 (two) times  daily. 09/04/19   [provider]  senna-docusate (SENOKOT-S) 8.6-50 MG tablet Take 1 tablet by mouth 2 (two) times daily. 09/30/19   Damita Lack, MD    Current Facility-Administered Medications  Medication Dose Route Frequency Provider Last Rate Last Admin  . (feeding supplement) PROSource Plus liquid 30 mL  30 mL Oral BID BM Jamse Arn, MD   30 mL at 10/02/19 0851  . acetaminophen (TYLENOL) tablet 325-650 mg  325-650 mg Oral Q4H PRN Bary Leriche, PA-C   650 mg at 10/01/19 2104  . bisacodyl (DULCOLAX) suppository 10 mg  10 mg Rectal Daily PRN Bary Leriche, PA-C   10 mg at 10/02/19 0707  . carvedilol (COREG) tablet 3.125 mg  3.125 mg Oral BID WC LoveIvan Anchors, PA-C   3.125 mg at 10/02/19 0850  . doxycycline (VIBRA-TABS) tablet 100 mg  100 mg Oral Q12H Bary Leriche, PA-C   100 mg at 10/02/19 0850  . DULoxetine (CYMBALTA) DR capsule 60 mg  60 mg Oral Daily Bary Leriche, PA-C   60 mg at 10/02/19 0849  . feeding supplement (ENSURE ENLIVE) (ENSURE ENLIVE) liquid 237 mL  237 mL Oral BID BM Jamse Arn, MD   237 mL at 10/02/19 0903  . finasteride (PROSCAR) tablet 5 mg  5 mg Oral Daily Bary Leriche, PA-C   5 mg at 10/02/19 0849  . gatifloxacin (ZYMAXID) 0.5 % ophthalmic drops 1 drop  1 drop Left Eye QID Bary Leriche, PA-C   1 drop at 10/02/19 0855  . guaiFENesin-dextromethorphan (ROBITUSSIN DM) 100-10 MG/5ML syrup 5-10 mL  5-10 mL Oral Q6H PRN Love, Pamela S, PA-C      . hydrocortisone sodium succinate (SOLU-CORTEF) 100 MG injection 50 mg  50 mg Intravenous Q8H Love, Pamela S, PA-C   50 mg at 10/02/19 5993   Followed by  . [START ON 10/03/2019] hydrocortisone sodium succinate (SOLU-CORTEF) 100 MG injection 12.5 mg  12.5 mg Intravenous Q8H Love, Pamela S, PA-C      . levofloxacin (LEVAQUIN) tablet 500 mg  500 mg Oral Q48H Love, Pamela S, PA-C   500 mg at 10/02/19 0850  . lidocaine (XYLOCAINE) 2 % jelly   Topical PRN Love, Pamela S, PA-C      . loratadine (CLARITIN)  tablet 10 mg  10 mg Oral Daily Bary Leriche, PA-C   10 mg at 10/02/19 0850  . mirabegron ER (MYRBETRIQ) tablet 50 mg  50 mg Oral Daily Bary Leriche, PA-C   50 mg at 10/02/19 0849  . neomycin-polymyxin b-dexamethasone (MAXITROL) ophthalmic ointment 1 application  1 application Left Eye QID Bary Leriche, PA-C   1 application at 57/01/77 0858  . nystatin (MYCOSTATIN) 100000 UNIT/ML suspension 500,000 Units  5 mL Oral QID Bary Leriche, PA-C   500,000 Units at 10/01/19 1450  . oxyCODONE (Oxy IR/ROXICODONE) immediate release tablet 5 mg  5 mg Oral Q4H PRN Reesa Chew  S, PA-C   5 mg at 10/01/19 2359  . polyethylene glycol (MIRALAX / GLYCOLAX) packet 17 g  17 g Oral Daily PRN Love, Pamela S, PA-C      . polyethylene glycol (MIRALAX / GLYCOLAX) packet 17 g  17 g Oral BID Bary Leriche, PA-C   17 g at 10/02/19 0850  . prednisoLONE acetate (PRED FORTE) 1 % ophthalmic suspension 1 drop  1 drop Left Eye QID Bary Leriche, PA-C   1 drop at 10/02/19 0855  . prochlorperazine (COMPAZINE) tablet 5-10 mg  5-10 mg Oral Q6H PRN Love, Pamela S, PA-C       Or  . prochlorperazine (COMPAZINE) injection 5-10 mg  5-10 mg Intramuscular Q6H PRN Love, Pamela S, PA-C       Or  . prochlorperazine (COMPAZINE) suppository 12.5 mg  12.5 mg Rectal Q6H PRN Love, Pamela S, PA-C      . senna-docusate (Senokot-S) tablet 1 tablet  1 tablet Oral BID Bary Leriche, PA-C   1 tablet at 10/02/19 0849  . sodium zirconium cyclosilicate (LOKELMA) packet 10 g  10 g Oral Daily Bary Leriche, PA-C   10 g at 10/02/19 0849  . torsemide (DEMADEX) tablet 20 mg  20 mg Oral Daily Elmarie Shiley, MD        Allergies as of 09/30/2019 - Review Complete 09/30/2019  Allergen Reaction Noted  . Oxybutynin Other (See Comments) 11/20/2014  . Sertraline Itching 12/13/2016  . Tadalafil Rash 12/13/2016    Family History  Problem Relation Age of Onset  . Alzheimer's disease Mother   . High blood pressure Mother   . Cancer Father     Social  History   Socioeconomic History  . Marital status: Married    Spouse name: Not on file  . Number of children: Not on file  . Years of education: Not on file  . Highest education level: Not on file  Occupational History  . Not on file  Tobacco Use  . Smoking status: Former Smoker    Quit date: 1987    Years since quitting: 34.7  . Smokeless tobacco: Never Used  Substance and Sexual Activity  . Alcohol use: Never  . Drug use: Not Currently  . Sexual activity: Not on file  Other Topics Concern  . Not on file  Social History Narrative  . Not on file   Social Determinants of Health   Financial Resource Strain:   . Difficulty of Paying Living Expenses: Not on file  Food Insecurity:   . Worried About Charity fundraiser in the Last Year: Not on file  . Ran Out of Food in the Last Year: Not on file  Transportation Needs:   . Lack of Transportation (Medical): Not on file  . Lack of Transportation (Non-Medical): Not on file  Physical Activity:   . Days of Exercise per Week: Not on file  . Minutes of Exercise per Session: Not on file  Stress:   . Feeling of Stress : Not on file  Social Connections:   . Frequency of Communication with Friends and Family: Not on file  . Frequency of Social Gatherings with Friends and Family: Not on file  . Attends Religious Services: Not on file  . Active Member of Clubs or Organizations: Not on file  . Attends Archivist Meetings: Not on file  . Marital Status: Not on file  Intimate Partner Violence:   . Fear of Current or Ex-Partner: Not on  file  . Emotionally Abused: Not on file  . Physically Abused: Not on file  . Sexually Abused: Not on file    Review of Systems: Gen: Denies fever. No weight loss.  CV: Denies CP or palpitations.  Resp: Denies cough or SOB/  GI: See HPI.   GU : Denies urinary burning, blood in urine, increased urinary frequency or incontinence. MS: + muscle weakness.  Derm: Denies rash, itchiness, skin  lesions or unhealing ulcers. Psych: Denies depression. + memory issues.  Heme: Denies easy bruising, bleeding. Neuro:  Denies headaches, dizziness or paresthesias. Endo:  Denies any problems with DM, thyroid or adrenal function.  Physical Exam: Vital signs in last 24 hours: Temp:  [97.8 F (36.6 C)-98 F (36.7 C)] 97.8 F (36.6 C) (09/29 0437) Pulse Rate:  [84-101] 84 (09/29 0850) Resp:  [16-20] 20 (09/29 0437) BP: (127-142)/(88-96) 142/94 (09/29 0850) SpO2:  [94 %-97 %] 94 % (09/29 0437) Last BM Date: 09/27/19 General:  74 year old male resting in bed, appears fatigued in NAD.  Head:  Normocephalic and atraumatic. Eyes:  Left eye bandage/cover intact.  Ears:  Normal auditory acuity. Nose:  No deformity, discharge or lesions. Mouth:  Dentition intact. No ulcers or lesions.  Neck:  Supple. No lymphadenopathy or thyromegaly.  Lungs:  Breath sounds clear throughout.  Heart:  RRR, 2/6 systolic murmur.  Abdomen: Moderately distended, soft, not tense. Positive bowel sounds x 4 quads. No HSM. No mass.  Rectal: Deferred. Musculoskeletal: LUE deformity.  Pulses:  Normal pulses noted. Extremities:  LEs with 2 to 3 + edema. UEs 1+ edema L > R. Neurologic:  Alert and  oriented x 3. No focal deficits.  Skin:  Intact without significant lesions or rashes. Psych:  Alert and cooperative. Normal mood and affect.  Intake/Output from previous day: 09/28 0701 - 09/29 0700 In: 540 [P.O.:540] Out: 1275 [Urine:1275] Intake/Output this shift: Total I/O In: 300 [P.O.:300] Out: 875 [Urine:875]  Lab Results: Recent Labs    10/01/19 0452 10/02/19 0753  WBC 9.6 10.6*  HGB 13.2 13.5  HCT 38.6* 39.4  PLT 73* 64*   BMET Recent Labs    09/30/19 0429 10/01/19 0452  NA 141 144  K 5.1 5.0  CL 108 108  CO2 24 26  GLUCOSE 118* 132*  BUN 82* 86*  CREATININE 2.91* 2.98*  CALCIUM 8.4* 8.5*   LFT Recent Labs    10/01/19 0452  PROT 6.4*  ALBUMIN 1.7*  AST 106*  ALT 97*  ALKPHOS 137*    BILITOT 5.1*   PT/INR No results for input(s): LABPROT, INR in the last 72 hours. Hepatitis Panel No results for input(s): HEPBSAG, HCVAB, HEPAIGM, HEPBIGM in the last 72 hours.    Studies/Results: DG Abd 1 View  Result Date: 10/01/2019 CLINICAL DATA:  Abdomen distension EXAM: ABDOMEN - 1 VIEW COMPARISON:  None. FINDINGS: Air distension of what appears to be cecum in the right mid abdomen up to 14.8 cm. Remainder of the gas pattern is unremarkable. No radiopaque calculi are visualized. IMPRESSION: Air distension of what appears to be cecum in the right mid abdomen up to 14.8 cm, raising possibility of cecal bascule/cecal obstruction. CT may be considered for further evaluation. These results will be called to the ordering clinician or representative by the Radiologist Assistant, and communication documented in the PACS or [employer]. Electronically Signed   By: Donavan Foil M.D.   On: 10/01/2019 16:25   NM Hepatobiliary Liver Func  Result Date: 09/30/2019 CLINICAL DATA:  Chronic upper abdominal pain, gallbladder wall thickening on ultrasound, assess gallbladder motility EXAM: NUCLEAR MEDICINE HEPATOBILIARY IMAGING TECHNIQUE: Sequential images of the abdomen were obtained out to 60 minutes following intravenous administration of radiopharmaceutical. Due to nonvisualization of the gallbladder, 3 mg of morphine was administered and imaging was continued for an additional 30 minutes. RADIOPHARMACEUTICALS:  5.3 mCi Tc-44m Choletec IV COMPARISON:  Ultrasound abdomen 09/27/2019 FINDINGS: Delayed clearance of tracer from bloodstream indicating impaired hepatocellular function. Abnormal appearance of liver with inferior position and discontiguous appearance of the lateral segment LEFT lobe versus remainder of liver, question hepatic anomaly. Significant retention of tracer within hepatic parenchyma as well as within dilated central biliary tree of the lateral segment LEFT lobe. Delayed visualization  of CBD. No definite bowel activity is identified by the conclusion of the exam. Gallbladder is probably visualized just before morphine administration with increased tracer accumulation in probable gallbladder following morphine. 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 Visualization of central biliary radicles, probably gallbladder, and CBD. No small bowel activity is identified to confirm CBD patency; in light of the biliary dilatation identified by ultrasound, recommend assessment by MR imaging with MRCP imaging to evaluate. If patient is unable to undergo MR imaging, recommend CT assessment, with IV contrast if renal function permits. Electronically Signed   By: MLavonia DanaM.D.   On: 09/30/2019 14:25    IMPRESSION/PLAN:  135 74year old male admitted to the hospital 09/18/2019 due to a left eye injury after he fell, s/p repair of open globe, scleral laceration with resection of prolapsed uveal tissue 9/16.  He developed  generalized abdominal pain and distension 2 days ago. An  Abdominal xray 9/28 concerning for a cecal bascule vs volvulus vs pseudoobstruction. No N/V. He has passed 3 soft to formed BMs today. He is hemodynamically stable.  -Await abd/pelvic CT results as ordered by general surgery -Further GI recommendations to be determined after abd/pelvic CT results received -NPO for now -IV fluids per nephrology and the hospitalist  -Pain management per the hospitalist  2. Chronic hepatitis C, treated  [facility]   3. Parkinson's disease  4. AKI -Nephrology following   Further recommendations per Dr. JWilma FlavinMDorathy Daft 10/02/2019, 10:34 AM    ________________________________________________________________________  LVelora HecklerGI MD note:  I personally examined the patient, reviewed the data and agree with the assessment and  plan described above.  He has cirrhosis (current MELD 26) due at least in part to chronic Hep C ([facility] if [facility] now).  In hosptal for optho trauma, AKI. While recovering in rehab he began to complain of some abd discomfort. KUB yesteday noted. CT was ordered, not done yet however.  In the room he is sitting up in a chair, wife with him. He just had a very large BM per his wife. She says he's barely had any BMs at all in about 2 weeks since admission. He  feels like he needs to go more.  He has mild abd discomfort, but is certainly not in signficant abd pain. He has only mild tenderness and is distended, BS+.  He's had no vomiting.  Await CT scan, this will give great information about his bowels and hopefully about his liver, biliary tree as well.   DOwens Loffler MD LRanch[facility]age Surgery CenterGastroenterology Pager [phone]

## 2019-10-02 NOTE — Patient Care Conference (Signed)
Inpatient RehabilitationTeam Conference and Plan of Care Update Date: 10/02/2019   Time: 11:46 AM    Patient Name: Nicholas Ramirez      Medical Record Number: [id number]  Date of Birth: 1945-10-28 Sex: Male         Room/Bed: 4M03C/4M03C-01 Payor Info: Payor: [place] / Plan: [facility] MEDICARE / Product Type: *No Product type* /    Admit Date/Time:  09/30/2019  7:56 PM  Primary Diagnosis:  Parkinson's disease [facility])  Hospital Problems: Principal Problem:   Parkinson's disease ([place]) Active Problems:   Debility   Abdominal distention   Hypoalbuminemia due to protein-calorie malnutrition (HCC)   Hypokalemia   Dysphagia   Transaminitis   Hyperkalemia   AKI (acute kidney injury) ([place])   Leukocytosis   Prolonged Q-T interval on ECG    Expected Discharge Date: Expected Discharge Date: 10/24/19  Team Members Present: Physician leading conference: Dr. Delice Lesch Care Coodinator Present: Dorien Chihuahua, RN, BSN, CRRN;Other (comment) Jacqlyn Larsen [place], SW) Nurse Present: Rozetta Nunnery, RN PT Present: Becky Sax, PT OT Present: Simonne Come, OT SLP Present: Jettie Booze, CF-SLP PPS Coordinator present : Gunnar Fusi, SLP     Current Status/Progress Goal Weekly Team Focus  Bowel/Bladder   Foley cath for retention, LBM 9/24 Suppository given this morning.  voiding on own/min assist without difficulty  Assess bowel and bladder qshift and PRN   Swallow/Nutrition/ Hydration             ADL's             Mobility   bed mobility max A +2, sit<>stand in [place] max A+2, lateral scoot max +2  min/mod A  functional mobility/transfers, generalized strengthening, dynamic standing balance/coordination, gait training, imporved activity tolerance   Communication             Safety/Cognition/ Behavioral Observations            Pain   Left eye pain and generalized pain relieved with PRN tylenol q4 and PRN oxycodone q4.  pain less than 3  assess pain qshift and PRN   Skin   Blister  to right leg foam dressing in place  promote proper wound healing.  assess skin qshift and PRN.     Discharge Planning:  Plan to return home with wife assisting with his care, she was assisting with bathing and dressing prior to admission. Frequent falls at home   Team Discussion: Multi medical issues PTA. Abd pain noted; MD ordered xray and follow up with GI. History of multiple falls, confusion. Patient complains of pain in left eye. Incontinent of bowel with foley in place for now. Fatigue also impairs progress. Patient has a left latera lean and poor sitting balance requiring cues to correct. Mod assist to roll over.  Patient on target to meet rehab goals: Min -mod assist goals set  *See Care Plan and progress notes for long and short-term goals.   Revisions to Treatment Plan:   Teaching Needs: Transfers, toileting, medications, etc.  Current Barriers to Discharge: Decreased caregiver support, Home enviroment access/layout, Incontinence and Behavior  Possible Resolutions to Barriers: Family education      Medical Summary Current Status: Functional deficits secondary to Parkinson's Gait disorder. Recent fall with rupture of anterior left globe  Barriers to Discharge: Medical stability;Incontinence;Other (comments)  Barriers to Discharge Comments: Urinary retention Possible Resolutions to Barriers/Weekly Focus: Therapies, plan to d/c foley, optimize BP meds, follow ophtho recs, consider initation of Parkinson's meds, CT abd/pelvis ordered for ?cecal volvus,  follow labs   Continued Need for Acute Rehabilitation Level of Care: The patient requires daily medical management by a physician with specialized training in physical medicine and rehabilitation for the following reasons: Direction of a multidisciplinary physical rehabilitation program to maximize functional independence : Yes Medical management of patient stability for increased activity during participation in an intensive  rehabilitation regime.: Yes Analysis of laboratory values and/or radiology reports with any subsequent need for medication adjustment and/or medical intervention. : Yes   I attest that I was present, lead the team conference, and concur with the assessment and plan of the team.   Dorien Chihuahua B 10/02/2019, 2:11 PM

## 2019-10-02 NOTE — Progress Notes (Signed)
Physical Therapy Session Note  Patient Details  Name: Nicholas Ramirez MRN: [id number] Date of Birth: 05-05-1945  Today's Date: 10/02/2019 PT Individual Time: 1540-0867 PT Individual Time Calculation (min): 69 min   Short Term Goals: Week 1:  PT Short Term Goal 1 (Week 1): Patient will remain sitting with R UE support for >2 mins with no more than MinA x1 PT Short Term Goal 2 (Week 1): Patient will transfer bed<> wc with no more than ModA x2 PT Short Term Goal 3 (Week 1): Patient will be able to complete bed mobility with MaxA x1  Skilled Therapeutic Interventions/Progress Updates: Pt presents supine in bed and reluctantly agreeable to therapy.  Pt required assist of 2 for sup to sit transfer to EOB.  Pt sat EOB w/ mod A to eventually short periods of supervision for ace wrapping of BLEs and then threading pants over feet.  Pt can maintain seated balance although not at midline, lists to left.  Use of mirror to improve positioning.  Pt required Assist of 2 for sit to stand in [place] w/ elevated bed height.  Pt able to reach for bar w/ RUE, but requires assist or placement of LUE.  Pt stood w/ max A to left to maintain upright stance for total A to pull up pants.  Pt continues w/ strong list to left.  Pt performed sit to stand for transfer to TIS w/c.  Pt using mirror in room for positioning in midline in w/c including head positioning.  Pt wheeled to gym and performed "rainbow arc" w/ R UE w/ encouragement and manual assist for maneuvering over apex and down to opposite side.  Pt required max A to maintain forward lean.  Pt states need for BM.  Pt returned to room and performed sit to stand in [place] w/ assist of 2and then to bed.  Pt required assist of 2 for sit to supine transfer w/ NT.  Pt handed off to NT for pericare and all other needs.  MD in room for assessment.       Therapy Documentation Precautions:  Precautions Precautions: Fall, Other (comment) (L eye shield all times) Precaution  Comments: pt admitted for a fall, daughter reports 15-20 in last 6 months Restrictions Weight Bearing Restrictions: No General:   Vital Signs: Therapy Vitals Pulse Rate: 84 BP: (!) 142/94 Pain: c/o generalized pain, " all coming from eye", but not quantified.   Mobility:    Therapy/Group: Individual Therapy  Ladoris Gene 10/02/2019, 10:05 AM

## 2019-10-02 NOTE — Consult Note (Signed)
[facility] Surgery Consult Note  Nicholas Ramirez 1945/11/15  [id number].    Requesting MD: Jamse Arn, MD Chief Complaint/Reason for Consult: cecal bascule vs cecal volvulus HPI: Mr. Nicholas Ramirez is a 74 y/o M with a PMH chronic hepatitis C, HTN, Depression, multiple falls, and recent diagnosis of Parkinson's disease who was admitted to the hospital 9/16-9/27/21 after a fall that resulted in a ruptured left globe requiring emergent surgical intervention. On 09/30/19 he was discharged from the hospital to [place]. Due to abdominal pain, distention, elevated LFT's, and constipation a RUQ U/S was ordered which showed no stones and GB gall edema. HIDA scan with no obvious biliary obstruction but limited due to underlying hepatocellular dysfunction. KUB was ordered 10/02/19 revealing a dilated cecum (14 cm) concerning for possible cecal volvulus/obstruction and general surgery has been asked to see.   Today the patient denies significant abdominal pain. He does report about a 2 week history of abdominal pain and distention. Denies nausea or vomiting. States his pain has improved after 3 BMs overnight. Reports he is having flatus and also belching. Reports a history of tobacco use but quit smoking 20 years ago. States prior to admission he was drinking about 6 beers weekly. Denies illicit drug use. Lives at home with his wife who he says is also in poor health.  Based on care everywhere he was seen by [facility] hepatology in the past (10/2017) and at that time his MELD score was 7. Upper endoscopy 12/2017 showed no evidence of esophageal or gastric varices, duodenum was normal.   ROS: Above Review of Systems  All other systems reviewed and are negative.  Family History  Problem Relation Age of Onset  . Alzheimer's disease Mother   . High blood pressure Mother   . Cancer Father     Past Medical History:  Diagnosis Date  . Asthma   . Depression   . Heart murmur   . Hepatitis C   .  Hypertension     Past Surgical History:  Procedure Laterality Date  . CARPAL TUNNEL RELEASE Left   . RUPTURED GLOBE EXPLORATION AND REPAIR Left 09/18/2019   Procedure: REPAIR OF RUPTURED GLOBE;  Surgeon: Lonia Skinner, MD;  Location: [facility];  Service: Ophthalmology;  Laterality: Left;    Social History:  reports that he quit smoking about 34 years ago. He has never used smokeless tobacco. He reports previous drug use. He reports that he does not drink alcohol.  Allergies:  Allergies  Allergen Reactions  . Oxybutynin Other (See Comments)    cramps  . Sertraline Itching  . Tadalafil Rash    Medications Prior to Admission  Medication Sig Dispense Refill  . aspirin 81 MG EC tablet Take 81 mg by mouth daily.    . carbidopa-levodopa (SINEMET IR) 25-100 MG tablet Take 1/2 tablet in AM and bedtime for one week, then 1/2 tablet three times daily for one week, then take 1 tablet three times daily. 90 tablet 0  . cetirizine (ZYRTEC) 10 MG tablet Take 10 mg by mouth daily.    Marland Kitchen doxycycline (VIBRA-TABS) 100 MG tablet Take 1 tablet (100 mg total) by mouth every 12 (twelve) hours for 3 days. 6 tablet 0  . DULoxetine (CYMBALTA) 60 MG capsule Take 60 mg by mouth daily.    . finasteride (PROSCAR) 5 MG tablet Take 5 mg by mouth daily.    Marland Kitchen gatifloxacin (ZYMAXID) 0.5 % SOLN Place 1 drop into the left eye 4 (four) times daily  for 15 days.    Marland Kitchen levofloxacin (LEVAQUIN) 500 MG tablet Take 1 tablet (500 mg total) by mouth every other day for 3 days.    Marland Kitchen MYRBETRIQ 50 MG TB24 tablet Take 50 mg by mouth daily.    Marland Kitchen neomycin-polymyxin b-dexamethasone (MAXITROL) 3.5-10000-0.1 OINT Place 1 application into the left eye 4 (four) times daily for 15 days. 60 g 0  . nystatin (MYCOSTATIN) 100000 UNIT/ML suspension Take 5 mLs (500,000 Units total) by mouth 4 (four) times daily for 7 days. 60 mL 0  . polyethylene glycol (MIRALAX / GLYCOLAX) 17 g packet Take 17 g by mouth 2 (two) times daily. 14 each 0  . prednisoLONE  acetate (PRED FORTE) 1 % ophthalmic suspension Place 1 drop into the left eye 4 (four) times daily for 15 days. 5 mL 0  . propranolol (INDERAL) 40 MG tablet Take 40 mg by mouth 2 (two) times daily.    Marland Kitchen senna-docusate (SENOKOT-S) 8.6-50 MG tablet Take 1 tablet by mouth 2 (two) times daily.      Blood pressure (!) 142/94, pulse 84, temperature 97.8 F (36.6 C), resp. rate 20, weight 107 kg, SpO2 94 %. Physical Exam: Constitutional: NAD; chronically ill appearing male  Eyes: L eye shield in place, dried sanguinous drainage, right pupil equal, round, EOM on right in tact Neck: Trachea midline; no thyromegaly Lungs: Normal respiratory effort; no tactile fremitus CV: RRR; no palpable thrills; there is upper and lower extremity edema  GI: Abd with moderate distention particularly in the upper abdomen, soft, non-tender, hyperactive BS, no peritonitis  MSK: symmetrical; no clubbing/cyanosis Psychiatric: flat affect; alert and oriented x3 Lymphatic: No palpable cervical or axillary lymphadenopathy  Results for orders placed or performed during the hospital encounter of 09/30/19 (from the past 48 hour(s))  CBC WITH DIFFERENTIAL     Status: Abnormal   Collection Time: 10/01/19  4:52 AM  Result Value Ref Range   WBC 9.6 4.0 - 10.5 K/uL   RBC 3.79 (L) 4.22 - 5.81 MIL/uL   Hemoglobin 13.2 13.0 - 17.0 g/dL   HCT 38.6 (L) 39 - 52 %   MCV 101.8 (H) 80.0 - 100.0 fL   MCH 34.8 (H) 26.0 - 34.0 pg   MCHC 34.2 30.0 - 36.0 g/dL   RDW 18.2 (H) 11.5 - 15.5 %   Platelets 73 (L) 150 - 400 K/uL    Comment: REPEATED TO VERIFY Immature Platelet Fraction may be clinically indicated, consider ordering this additional test [id number] CONSISTENT WITH PREVIOUS RESULT    nRBC 0.0 0.0 - 0.2 %   Neutrophils Relative % 84 %   Neutro Abs 8.0 (H) 1.7 - 7.7 K/uL   Lymphocytes Relative 9 %   Lymphs Abs 0.9 0.7 - 4.0 K/uL   Monocytes Relative 6 %   Monocytes Absolute 0.6 0 - 1 K/uL   Eosinophils Relative 0 %    Eosinophils Absolute 0.0 0 - 0 K/uL   Basophils Relative 0 %   Basophils Absolute 0.0 0 - 0 K/uL   Immature Granulocytes 1 %   Abs Immature Granulocytes 0.08 (H) 0.00 - 0.07 K/uL    Comment: Performed at [facility], [address] [place], [place] [address]  Comprehensive metabolic panel     Status: Abnormal   Collection Time: 10/01/19  4:52 AM  Result Value Ref Range   Sodium 144 135 - 145 mmol/L   Potassium 5.0 3.5 - 5.1 mmol/L   Chloride 108 98 - 111 mmol/L  CO2 26 22 - 32 mmol/L   Glucose, Bld 132 (H) 70 - 99 mg/dL    Comment: Glucose reference range applies only to samples taken after fasting for at least 8 hours.   BUN 86 (H) 8 - 23 mg/dL   Creatinine, Ser 2.98 (H) 0.61 - 1.24 mg/dL   Calcium 8.5 (L) 8.9 - 10.3 mg/dL   Total Protein 6.4 (L) 6.5 - 8.1 g/dL   Albumin 1.7 (L) 3.5 - 5.0 g/dL   AST 106 (H) 15 - 41 U/L   ALT 97 (H) 0 - 44 U/L   Alkaline Phosphatase 137 (H) 38 - 126 U/L   Total Bilirubin 5.1 (H) 0.3 - 1.2 mg/dL   GFR calc non Af Amer 20 (L) >60 mL/min   GFR calc Af Amer 23 (L) >60 mL/min   Anion gap 10 5 - 15    Comment: Performed at [facility], [place] [address] [place], [place] [address]  CBC with Differential/Platelet     Status: Abnormal   Collection Time: 10/02/19  7:53 AM  Result Value Ref Range   WBC 10.6 (H) 4.0 - 10.5 K/uL   RBC 3.89 (L) 4.22 - 5.81 MIL/uL   Hemoglobin 13.5 13.0 - 17.0 g/dL   HCT 39.4 39 - 52 %   MCV 101.3 (H) 80.0 - 100.0 fL   MCH 34.7 (H) 26.0 - 34.0 pg   MCHC 34.3 30.0 - 36.0 g/dL   RDW 17.5 (H) 11.5 - 15.5 %   Platelets 64 (L) 150 - 400 K/uL    Comment: REPEATED TO VERIFY Immature Platelet Fraction may be clinically indicated, consider ordering this additional test [id number] CONSISTENT WITH PREVIOUS RESULT    nRBC 0.0 0.0 - 0.2 %   Neutrophils Relative % 82 %   Neutro Abs 8.6 (H) 1.7 - 7.7 K/uL   Lymphocytes Relative 9 %   Lymphs Abs 1.0 0.7 - 4.0 K/uL   Monocytes Relative 8 %   Monocytes Absolute 0.8 0  - 1 K/uL   Eosinophils Relative 0 %   Eosinophils Absolute 0.0 0 - 0 K/uL   Basophils Relative 0 %   Basophils Absolute 0.0 0 - 0 K/uL   Immature Granulocytes 1 %   Abs Immature Granulocytes 0.10 (H) 0.00 - 0.07 K/uL    Comment: Performed at [facility] [address] [place], [place] [address]   DG Abd 1 View  Result Date: 10/01/2019 CLINICAL DATA:  Abdomen distension EXAM: ABDOMEN - 1 VIEW COMPARISON:  None. FINDINGS: Air distension of what appears to be cecum in the right mid abdomen up to 14.8 cm. Remainder of the gas pattern is unremarkable. No radiopaque calculi are visualized. IMPRESSION: Air distension of what appears to be cecum in the right mid abdomen up to 14.8 cm, raising possibility of cecal bascule/cecal obstruction. CT may be considered for further evaluation. These results will be called to the ordering clinician or representative by the Radiologist Assistant, and communication documented in the PACS or [employer]. Electronically Signed   By: Donavan Foil M.D.   On: 10/01/2019 16:25   NM Hepatobiliary Liver Func  Result Date: 09/30/2019 CLINICAL DATA:  Chronic upper abdominal pain, gallbladder wall thickening on ultrasound, assess gallbladder motility EXAM: NUCLEAR MEDICINE HEPATOBILIARY IMAGING TECHNIQUE: Sequential images of the abdomen were obtained out to 60 minutes following intravenous administration of radiopharmaceutical. Due to nonvisualization of the gallbladder, 3 mg of morphine was administered and imaging was continued for an additional  30 minutes. RADIOPHARMACEUTICALS:  5.3 mCi Tc-73m  Choletec IV COMPARISON:  Ultrasound abdomen 09/27/2019 FINDINGS: Delayed clearance of tracer from bloodstream indicating impaired hepatocellular function. Abnormal appearance of liver with inferior position and discontiguous appearance of the lateral segment LEFT lobe versus remainder of liver, question hepatic anomaly. Significant retention of tracer within hepatic  parenchyma as well as within dilated central biliary tree of the lateral segment LEFT lobe. Delayed visualization of CBD. No definite bowel activity is identified by the conclusion of the exam. Gallbladder is probably visualized just before morphine administration with increased tracer accumulation in probable gallbladder following morphine. 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 Visualization of central biliary radicles, probably gallbladder, and CBD. No small bowel activity is identified to confirm CBD patency; in light of the biliary dilatation identified by ultrasound, recommend assessment by MR imaging with MRCP imaging to evaluate. If patient is unable to undergo MR imaging, recommend CT assessment, with IV contrast if renal function permits. Electronically Signed   By: Lavonia Dana M.D.   On: 09/30/2019 14:25    Assessment/Plan Parkinson's disease Ruptured globe left eye s/p surgical repair 09/19/19 Dr. Eulas Post HTN Prolonged QT interval Dysphagia  Chronic Hepatitis C - has not been seen by hepatology since 12/2017. Repeat CMP and INR to get updated MELD score. 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 CCS will follow   Jill Alexanders, [facility] Please see Amion for pager number during day hours 7:00am-4:30pm 10/02/2019, 10:41 AM

## 2019-10-03 ENCOUNTER — Inpatient Hospital Stay (HOSPITAL_COMMUNITY): Payer: Medicare Other

## 2019-10-03 ENCOUNTER — Inpatient Hospital Stay (HOSPITAL_COMMUNITY): Payer: Medicare Other | Admitting: Occupational Therapy

## 2019-10-03 ENCOUNTER — Inpatient Hospital Stay (HOSPITAL_COMMUNITY): Payer: Medicare Other | Admitting: Speech Pathology

## 2019-10-03 DIAGNOSIS — R188 Other ascites: Secondary | ICD-10-CM

## 2019-10-03 HISTORY — PX: IR PARACENTESIS: IMG2679

## 2019-10-03 LAB — BODY FLUID CELL COUNT WITH DIFFERENTIAL
Eos, Fluid: 0 %
Lymphs, Fluid: 69 %
Monocyte-Macrophage-Serous Fluid: 27 % — ABNORMAL LOW (ref 50–90)
Neutrophil Count, Fluid: 4 % (ref 0–25)
Total Nucleated Cell Count, Fluid: 59 cu mm (ref 0–1000)

## 2019-10-03 LAB — CBC WITH DIFFERENTIAL/PLATELET
Abs Immature Granulocytes: 0.09 10*3/uL — ABNORMAL HIGH (ref 0.00–0.07)
Basophils Absolute: 0 10*3/uL (ref 0.0–0.1)
Basophils Relative: 0 %
Eosinophils Absolute: 0 10*3/uL (ref 0.0–0.5)
Eosinophils Relative: 0 %
HCT: 39.8 % (ref 39.0–52.0)
Hemoglobin: 13.7 g/dL (ref 13.0–17.0)
Immature Granulocytes: 1 %
Lymphocytes Relative: 13 %
Lymphs Abs: 1.3 10*3/uL (ref 0.7–4.0)
MCH: 34.7 pg — ABNORMAL HIGH (ref 26.0–34.0)
MCHC: 34.4 g/dL (ref 30.0–36.0)
MCV: 100.8 fL — ABNORMAL HIGH (ref 80.0–100.0)
Monocytes Absolute: 0.9 10*3/uL (ref 0.1–1.0)
Monocytes Relative: 9 %
Neutro Abs: 7.9 10*3/uL — ABNORMAL HIGH (ref 1.7–7.7)
Neutrophils Relative %: 77 %
Platelets: 61 10*3/uL — ABNORMAL LOW (ref 150–400)
RBC: 3.95 MIL/uL — ABNORMAL LOW (ref 4.22–5.81)
RDW: 17.2 % — ABNORMAL HIGH (ref 11.5–15.5)
WBC: 10.2 10*3/uL (ref 4.0–10.5)
nRBC: 0 % (ref 0.0–0.2)

## 2019-10-03 LAB — GRAM STAIN

## 2019-10-03 LAB — BASIC METABOLIC PANEL
Anion gap: 12 (ref 5–15)
BUN: 79 mg/dL — ABNORMAL HIGH (ref 8–23)
CO2: 32 mmol/L (ref 22–32)
Calcium: 8.1 mg/dL — ABNORMAL LOW (ref 8.9–10.3)
Chloride: 96 mmol/L — ABNORMAL LOW (ref 98–111)
Creatinine, Ser: 2.85 mg/dL — ABNORMAL HIGH (ref 0.61–1.24)
GFR calc Af Amer: 24 mL/min — ABNORMAL LOW (ref >60)
GFR calc non Af Amer: 21 mL/min — ABNORMAL LOW (ref >60)
Glucose, Bld: 138 mg/dL — ABNORMAL HIGH (ref 70–99)
Potassium: 3.7 mmol/L (ref 3.5–5.1)
Sodium: 140 mmol/L (ref 135–145)

## 2019-10-03 LAB — ALBUMIN, PLEURAL OR PERITONEAL FLUID: Albumin, Fluid: <1 g/dL

## 2019-10-03 IMAGING — US IR PARACENTESIS
1 series · 2 of 2 positions shown · non-contrast
Comparison: none

INDICATION: Abdominal distention. Newly found ascites. Request diagnostic and
therapeutic paracentesis.

[Series 1: ir (id) (id)/(id)/(id) ir · 2 of 2 slices shown]
[im 1/2]
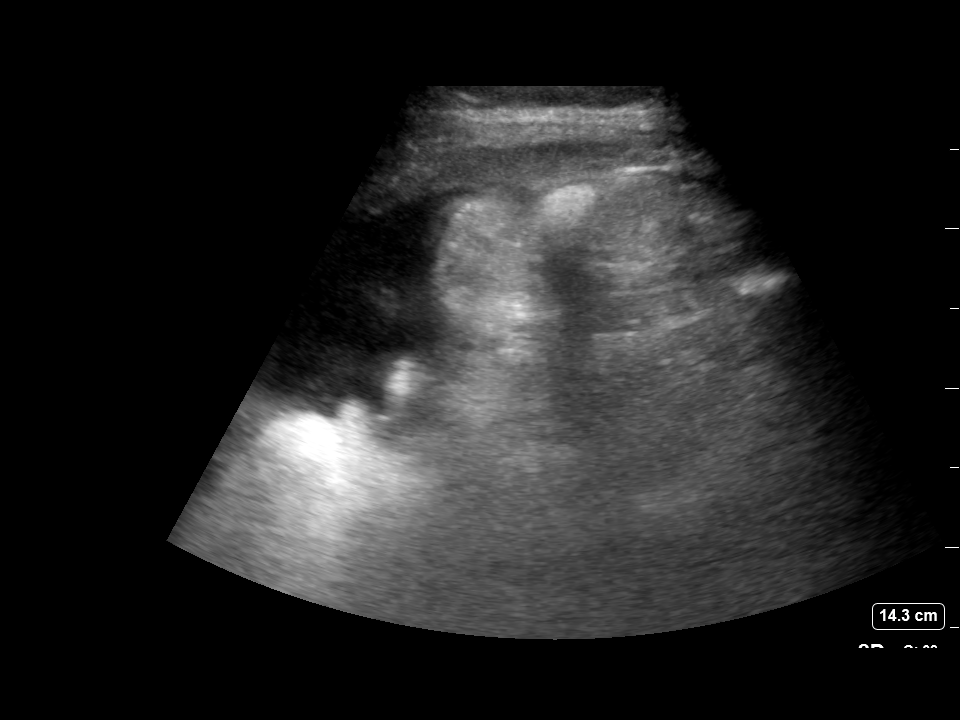
[im 2/2]
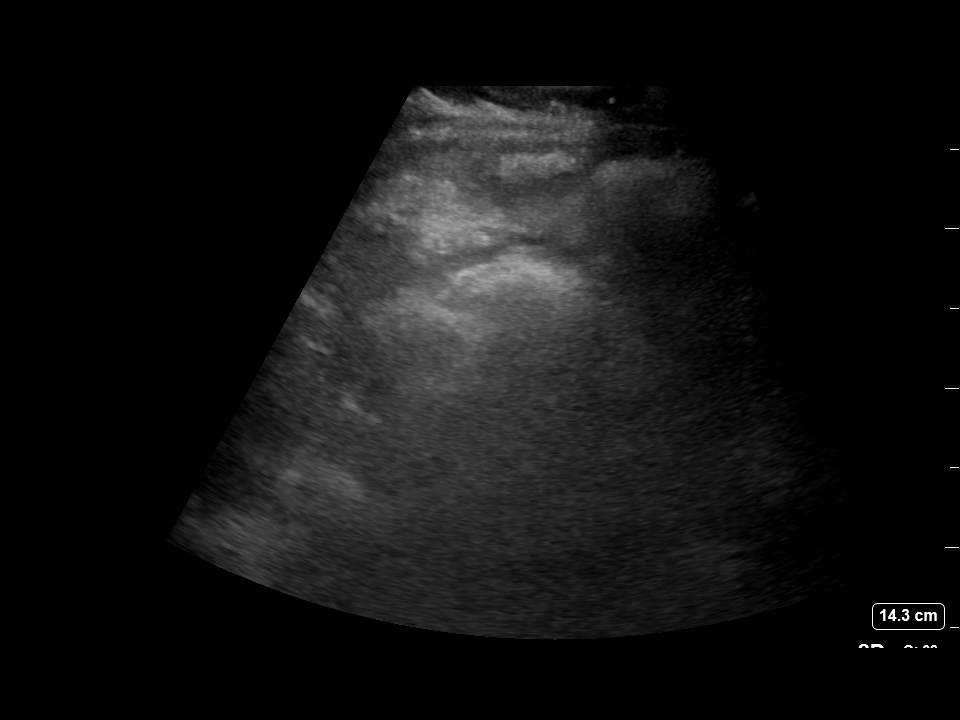

[2 of 2 positions shown; findings below may reference images not displayed]

EXAM:
ULTRASOUND GUIDED RIGHT LOWER QUADRANT PARACENTESIS

MEDICATIONS:
1% plain lidocaine, 10 mL

COMPLICATIONS:
None immediate.

PROCEDURE:
Informed written consent was obtained from the patient after a
discussion of the risks, benefits and alternatives to treatment. A
timeout was performed prior to the initiation of the procedure.

Initial ultrasound scanning demonstrates a small to moderate amount
of ascites within the right lower abdominal quadrant. There is
additional perihepatic and perisplenic ascites high in the abdomen.
The right lower abdomen was prepped and draped in the usual sterile
fashion. 1% lidocaine was used for local anesthesia.

Following this, a 19 gauge, 7-cm, Yueh catheter was introduced. An
ultrasound image was saved for documentation purposes. The
paracentesis was performed. The catheter was removed and a dressing
was applied. The patient tolerated the procedure well without
immediate post procedural complication.
FINDINGS: A total of approximately 1.3 L of clear yellow fluid was removed.
Samples were sent to the laboratory as requested by the clinical
team.
IMPRESSION: Successful ultrasound-guided paracentesis yielding 1.3 liters of
peritoneal fluid.

## 2019-10-03 MED ORDER — LIDOCAINE HCL (PF) 1 % IJ SOLN
INTRAMUSCULAR | Status: AC | PRN
  Administered 2019-10-03: 5 mL

## 2019-10-03 MED ORDER — BLISTEX MEDICATED EX OINT
TOPICAL_OINTMENT | CUTANEOUS | Status: DC | PRN
  Filled 2019-10-03: qty 6.3

## 2019-10-03 MED ORDER — LIDOCAINE HCL 1 % IJ SOLN
INTRAMUSCULAR | Status: AC
  Filled 2019-10-03: qty 20

## 2019-10-03 NOTE — Progress Notes (Signed)
[place] PHYSICAL MEDICINE & REHABILITATION PROGRESS NOTE   Subjective/Complaints: Patient seen laying in bed this morning.  He states he slept well overnight.  Daughter at bedside with questions regarding diet.  Discussed with patient and daughter results of CT and plans to advance diet, plan to DC Foley and resume bowel meds.  ROS: Denies CP, SOB, N/V/D  Objective:    CT ABDOMEN PELVIS WO CONTRAST  Result Date: 10/02/2019 CLINICAL DATA:  Abdominal distension EXAM: CT ABDOMEN AND PELVIS WITHOUT CONTRAST TECHNIQUE: Multidetector CT imaging of the abdomen and pelvis was performed following the standard protocol without IV contrast. COMPARISON:  HIDA scan 09/30/2019, ultrasound 09/27/2019 FINDINGS: Lower chest: Lung bases are clear. Hepatobiliary: There is intrahepatic biliary duct dilatation with the LEFT and RIGHT hepatic lobe. There is hypoattenuation liver parenchyma at the confluence of the LEFT and RIGHT bile duct systems (image 29/3). No clear common bile duct dilatation although the common bile duct difficult to follow on this noncontrast exam. Gallbladder is normal diameter at 2.2 cm. Large volume intraperitoneal free fluid surrounding the liver. This fluid is simple fluid attenuation. Pancreas: No pancreatic duct dilatation. No pancreatic inflammation. Spleen: Spleen is normal volume. Adrenals/urinary tract: Adrenal glands and kidneys are normal. The ureters and bladder normal. Bladder collapsed around Foley catheter. The Stomach/Bowel: Stomach, duodenum small-bowel normal. Terminal ileum is normal. Appendix normal. Moderate volume stool in the ascending transverse colon. Rectosigmoid colon normal. Vascular/Lymphatic: Abdominal aorta is normal caliber with atherosclerotic calcification. There is no retroperitoneal or periportal lymphadenopathy. No pelvic lymphadenopathy. Reproductive: Unremarkable Other: RIGHT volume of free fluid surrounds the spleen as well as the LEFT hepatic lobe collects  in the pelvis. Musculoskeletal: No aggressive osseous lesion. IMPRESSION: 1. No evidence of bowel obstruction. 2. Intrahepatic biliary duct dilatation within LEFT and RIGHT hepatic lobe with central low attenuation surrounding the confluence of the ductal systems. Difficult interpretation with no IV contrast however concern for centrally obstructing lesion such as infiltrating cholangiocarcinoma. If patient cannot receive IV contrast a MRI without contrast may provide further characterization; however, MRIs on in-patients can be problematic due to difficulty breath holding on lung sequences. 3. The common bile duct is not dilated. The pancreas appears normal. 4. Large volume of intraperitoneal free fluid is favored related to hepatic dysfunction. Electronically Signed   By: Suzy Bouchard M.D.   On: 10/02/2019 20:03   DG Abd 1 View  Result Date: 10/01/2019 CLINICAL DATA:  Abdomen distension EXAM: ABDOMEN - 1 VIEW COMPARISON:  None. FINDINGS: Air distension of what appears to be cecum in the right mid abdomen up to 14.8 cm. Remainder of the gas pattern is unremarkable. No radiopaque calculi are visualized. IMPRESSION: Air distension of what appears to be cecum in the right mid abdomen up to 14.8 cm, raising possibility of cecal bascule/cecal obstruction. CT may be considered for further evaluation. These results will be called to the ordering clinician or representative by the Radiologist Assistant, and communication documented in the PACS or [employer]. Electronically Signed   By: Donavan Foil M.D.   On: 10/01/2019 16:25   Recent Labs    10/01/19 0452 10/02/19 0753  WBC 9.6 10.6*  HGB 13.2 13.5  HCT 38.6* 39.4  PLT 73* 64*   Recent Labs    10/01/19 0452 10/02/19 2104  NA 144 141  K 5.0 4.9  CL 108 106  CO2 26 25  GLUCOSE 132* 116*  BUN 86* 79*  CREATININE 2.98* 2.53*  CALCIUM 8.5* 8.2*    Intake/Output  Summary (Last 24 hours) at 10/03/2019 1244 Last data filed at 10/03/2019  0552 Gross per 24 hour  Intake 0 ml  Output 3950 ml  Net -3950 ml        Physical Exam: Vital Signs Blood pressure 121/85, pulse 85, temperature 98.6 F (37 C), resp. rate 17, weight 107 kg, SpO2 96 %. Constitutional: No distress . Vital signs reviewed. HENT: Normocephalic.  Atraumatic. Eyes: EOMI. No discharge. Cardiovascular: No JVD.  RRR. Respiratory: Normal effort.  No stridor.  Bilateral clear to auscultation. GI: Non-distended.  BS +. Skin: Vascular changes bilateral lower extremities Psych: Flat. Musc: Generalized edema. Left periorbital edema Neuro: Alert Dysarthria Motor: RUE/RLE: 3+/5 proximal distal LUE: Limited due to significant tone/rigidity, appears to be 4/5 within available range of motion LLE: Hip flexion, knee extension 2/5, ankle dorsiflexion 3/5 Left lower extremity clonus  Assessment/Plan: 1. Functional deficits secondary to Parkinson's, with recent anterior left globe rupture which require 3+ hours per day of interdisciplinary therapy in a comprehensive inpatient rehab setting.  Physiatrist is providing close team supervision and 24 hour management of active medical problems listed below.  Physiatrist and rehab team continue to assess barriers to discharge/monitor patient progress toward functional and medical goals  Care Tool:  Bathing    Body parts bathed by patient: Right arm, Left arm, Chest, Abdomen, Face   Body parts bathed by helper:  (declined LB bathing at time of eval)     Bathing assist Assist Level: Total Assistance - Patient < 25%     Upper Body Dressing/Undressing Upper body dressing   What is the patient wearing?: Hospital gown only    Upper body assist Assist Level: Maximal Assistance - Patient 25 - 49%    Lower Body Dressing/Undressing Lower body dressing      What is the patient wearing?: Pants     Lower body assist Assist for lower body dressing: 2 Helpers     Toileting Toileting Toileting Activity did not  occur (Clothing management and hygiene only):  (not performed at time of eval, has Foley for urine and no need for BM at time of eval)  Toileting assist Assist for toileting: 2 Helpers     Transfers Chair/bed transfer  Transfers assist     Chair/bed transfer assist level: 2 Helpers     Locomotion Ambulation   Ambulation assist   Ambulation activity did not occur: Safety/medical concerns (fatigue/weakness)          Walk 10 feet activity   Assist  Walk 10 feet activity did not occur: Safety/medical concerns (fatigue/weakness)        Walk 50 feet activity   Assist Walk 50 feet with 2 turns activity did not occur: Safety/medical concerns (fatigue/weakness)         Walk 150 feet activity   Assist Walk 150 feet activity did not occur: Safety/medical concerns (fatigue/weakness)         Walk 10 feet on uneven surface  activity   Assist Walk 10 feet on uneven surfaces activity did not occur: Safety/medical concerns (fatigue/weakness)         Wheelchair     Assist Will patient use wheelchair at discharge?: Yes Type of Wheelchair: Manual    Wheelchair assist level: Dependent - Patient 0% (tilt in space wc does not allow for patient to propel himself)      Wheelchair 50 feet with 2 turns activity    Assist        Assist Level: Dependent - Patient 0% (  tilt in space wc does not allow for patient to propel himself)   Wheelchair 150 feet activity     Assist      Assist Level: Maximal Assistance - Patient 25 - 49%   Medical Problem List and Plan: 1.Functional deficitssecondary to Parkinson's Gait disorder. Recent fall with rupture of anterior left globe  Continue CIR 2. Antithrombotics: -DVT/anticoagulation:Mechanical:Antiembolism stockings, knee (TED hose) Bilateral lower extremities Sequential compression devices, below kneeBilateral lower extremities -antiplatelet therapy: n/a 3.Abdominal pain/Pain  Management:Continue oxycodoneprn  Appears to be controlled with meds on 9/30  Monitor with increased exertion 4. Mood:team to provide ego support -antipsychotic agents: n/a 5. Neuropsych: This patient is?  Not fully capable of making decisions on his own behalf. 6. Skin/Wound Care:local care as indicated 7. Fluids/Electrolytes/Nutrition:encourage appropriate po 8. Ruptured globe left eye s/p surgical repair 9/16 by ophthalmology Dr. Eulas Post -eye shield at all times -broad spectrum coverage transitioned tolevofloxacin and doxycyclineto be completed today. -continueeye gttsfor 2 additional weeks(~10/12) -outpt f/u with Dr. Eulas Post after discharge 9. HTN: -pt on lisinopril, amlodipine, propranolol at home -Coreg started on 9/27   Controlled on 9/30  Moderate increased mobility  10. QIW:[id number], likely related toATN v/svancomycin Mirabegron ER and finasteride resumed  Creatinine 2.53 on 9/29  Continue to monitor 11.Parkinson's Disease Sinemet added recently but not started per notes. Pt followed by Dr. Tomi Likens  Will consider initiation after completion of GI/surgery work-up 12. Hyponatremia, mild SIADH: Resolved 13. Prolonged QT interval: Avoid rate prolongation meds  ECG reviewed, showing persistent prolonged QTC 14. Constipation Senna-s two tabs at HS  Abdominal x-ray personally reviewed-cecal abnormality. CT abd/pelvis personally reviewed, agree with intraperitoneal fluid with left> right infiltrating lesion.  Await further GI/surgery recs  Adjust bowel meds as necessary  Constipation improving 15.Distended abdomen HIDA scan negative.Some chronic findings due to abnl small bowel motility   LFTs remain elevated on 9/29  See #14 16.  Hyperkalemia  Potassium 4.9 on 9/30  Continue Lokelma  Continue to monitor 17.   Hypoalbuminemia  Supplement initiated on 9/28 18.  Spasticity versus rigidity  See #11  Will consider neck MRI 19.  Dysphagia  Made n.p.o. by surgery, D2 thins resumed on 9/30 20.  Leukocytosis  WBCs 10.6 on 9/29, labs ordered for tomorrow  Afebrile  Continue to monitor  > 35 minutes spent in total with patient and daughter counseling, reviewing imaging, discussing plans, reviewing surgery and GI notes, etc.  LOS: 3 days A FACE TO FACE EVALUATION WAS PERFORMED  Pearle Wandler Lorie Phenix 10/03/2019, 12:44 PM

## 2019-10-03 NOTE — Evaluation (Signed)
Speech Language Pathology Assessment and Plan  Patient Details  Name: Nicholas Ramirez MRN: [id number] Date of Birth: Jul 01, 1945  SLP Diagnosis: Dysphagia  Rehab Potential: Good ELOS: 10/24/19    Today's Date: 10/03/2019 SLP Individual Time: 1100-1200 SLP Individual Time Calculation (min): 60 min   Hospital Problem: Principal Problem:   Parkinson's disease ([place]) Active Problems:   Debility   Abdominal distention   Hypoalbuminemia due to protein-calorie malnutrition ([facility])   Hypokalemia   Dysphagia   Transaminitis   Hyperkalemia   AKI (acute kidney injury) ([place])   Leukocytosis   Prolonged Q-T interval on ECG   Ascites  Past Medical History:  Past Medical History:  Diagnosis Date  . Asthma   . Depression   . Heart murmur   . Hepatitis C   . Hypertension    Past Surgical History:  Past Surgical History:  Procedure Laterality Date  . CARPAL TUNNEL RELEASE Left   . RUPTURED GLOBE EXPLORATION AND REPAIR Left 09/18/2019   Procedure: REPAIR OF RUPTURED GLOBE;  Surgeon: Lonia Skinner, MD;  Location: [facility];  Service: Ophthalmology;  Laterality: Left;    Assessment / Plan / Recommendation Clinical Impression   Nicholas Ramirez is a45 yo male with history of HTN, hepatitis C who has had multiple falls at home over the last few months. Pt was seen by neurology and after extensive work up it was felt that he was suffering from potential Parkinson's Disease. Dr. Tomi Likens started him sinemet09/13 with MRI C spine ordered for work up.Pt was admitted on 09/18/19 after a fall at home where his head struck a Ec[employer]. He was unable to see after the fall CT of eyes/head revealed hyphema, ruptured globe left eye with open globe, uveal prolapse with loss of tissue and no light preception.Ophthalmology was consulted and patient underwent surgical repair of the left eye by Dr. Eulas Post on 9/16. Pt was placed on broad spectrum antibiotics for coverage.To continue eye shield at all times and eye  drops for one month post op. Pt subsequently developed AKI, nephrology was consulted and vancomycin was discontinued.AKI felt to be multifactorial with UOP improving post foley placement for BOO.Renal function has slowly improved, no HD necessaryand nephrology following for input.Abdominal ultrasound showed distended GB with intrahepatic biliary duct dilatation and suspicion of cirrhosis.Elevated LFT being monitored and HIDA scan ordered due to issues with abdominal pain and concerns of acute cholecystitis-->negative for acute pathology and no surgical intervention needed.Pt was screened by the inpatient rehab team, and it was felt that he could benefit from interdisciplinary rehab to address his balance and functional deficits related to his PD and recent fall/med-surg complications.  Patient presents with a primary oral dysphagia with suspected pharyngeal component secondary to patient with Parkinson's Disease symptoms (medical workup is ongoing at this time). Oral phase is marked by significantly delayed mastication, delayed oral manipulation and transit of regular solids, as well as minimal amount of residuals throughout oral cavity that did clear with thin liquids sips and puree bites (applesauce). Patient was alert but responses and actions are all slowed and patient does endorse being fatigued. He was oriented to place and situation, stated date as "18th". Full cognitive-linguistic evaluation not completed as only swallow evaluation ordered at this time. Plan for SLP to seek orders for Speech-Language Evaluation after seeing patient on next date to determine needs. He was fairly fatigued overall and would likely not test accurately for cognitive function until more alert. Patient will benefit from SLP intervention to maximize swallow function  for safety with PO intake prior to discharge.    Skilled Therapeutic Interventions          Bedside swallow evaluation  SLP Assessment  Patient will  need skilled Speech Lanaguage Pathology Services during CIR admission    Recommendations  SLP Diet Recommendations: Dysphagia 2 (Fine chop);Thin Liquid Administration via: Cup;Straw Medication Administration: Crushed with puree Supervision: Staff to assist with self feeding;Full supervision/cueing for compensatory strategies Compensations: Slow rate;Small sips/bites Postural Changes and/or Swallow Maneuvers: Seated upright 90 degrees Oral Care Recommendations: Oral care BID Patient destination: Home Follow up Recommendations: 24 hour supervision/assistance;Other (comment) (TBD pending progress) Equipment Recommended: None recommended by SLP    SLP Frequency 3 to 5 out of 7 days   SLP Duration  SLP Intensity  SLP Treatment/Interventions 10/24/19  Minumum of 1-2 x/day, 30 to 90 minutes  Dysphagia/aspiration precaution training;Cueing hierarchy;Environmental controls    Pain Pain Assessment Pain Scale: Faces Faces Pain Scale: No hurt  Prior Functioning Cognitive/Linguistic Baseline: Within functional limits Type of Home: House  Lives With: Spouse Available Help at Discharge: Available 24 hours/day Vocation: Retired  SLP Evaluation    Bedside Swallowing Assessment General Date of Onset: 09/28/19 Previous Swallow Assessment: BSE on 9/26 recommending Dys 2, thin Diet Prior to this Study: Dysphagia 2 (chopped);Thin liquids Temperature Spikes Noted: No Respiratory Status: Room air History of Recent Intubation: No Behavior/Cognition: Alert;Lethargic/Drowsy;Cooperative;Pleasant mood Oral Cavity - Dentition: Missing dentition Self-Feeding Abilities: Needs set up;Needs assist Patient Positioning: Upright in bed Baseline Vocal Quality: Hoarse;Low vocal intensity Volitional Cough: Weak Volitional Swallow: Able to elicit  Oral Care Assessment   Ice Chips Ice chips: Not tested Thin Liquid Thin Liquid: Within functional limits Presentation: Straw Other Comments: Patient  did not exhibit any coughing or throat clearing or other s/s that would be concerning for potential aspiration or penetration even with successive straw sips Nectar Thick   Honey Thick   Puree Puree: Within functional limits Solid Solid: Impaired Oral Phase Impairments: Impaired mastication;Reduced lingual movement/coordination Oral Phase Functional Implications: Prolonged oral transit;Impaired mastication Other Comments: Mastication and oral manipulation and transit of solids was significantly delayed BSE Assessment Risk for Aspiration Impact on safety and function: Mild aspiration risk Other Related Risk Factors: Lethargy;Other (comment) (suspicion of Parkinson's Disease (workup still be completed))  Short Term Goals: Week 1: SLP Short Term Goal 1 (Week 1): Patient will consume meal trays of Dys 2 (fine chop), thin liquids without overt s/s of aspiration or penetration and without significant oral delay with minA. SLP Short Term Goal 2 (Week 1): Patient will tolerate trials of upgraded solids with adequately efficient mastication and with no more than trace oral residuals post initial swallow. SLP Short Term Goal 3 (Week 1): Patient will demonstrate understanding of and effective use of learned swallow strategies with minA cues.  Refer to Care Plan for Long Term Goals  Recommendations for other services: Neuropsych  Discharge Criteria: Patient will be discharged from SLP if patient refuses treatment 3 consecutive times without medical reason, if treatment goals not met, if there is a change in medical status, if patient makes no progress towards goals or if patient is discharged from hospital.  The above assessment, treatment plan, treatment alternatives and goals were discussed and mutually agreed upon: by patient and by family   Sonia Baller, MA, CCC-SLP Speech Therapy

## 2019-10-03 NOTE — Progress Notes (Signed)
Per call from MRI, patient is to be strict NPO from 1300 till 1700 today 10/03/2019.

## 2019-10-03 NOTE — Progress Notes (Signed)
[facility] Progress Note    Since last GI note: CT was very interesting. His colon is normal however his liver is quite abnormal.  I spoke with him and his daughter in the room, his daughter is an Therapist, sports and very helpful  Objective: Vital signs in last 24 hours: Temp:  [97.6 F (36.4 C)-98.6 F (37 C)] 98.6 F (37 C) (09/30 0550) Pulse Rate:  [85-108] 85 (09/30 0550) Resp:  [12-17] 17 (09/30 0550) BP: (119-124)/(82-91) 121/85 (09/30 0550) SpO2:  [96 %-98 %] 96 % (09/30 0550) Last BM Date: 10/02/19 General: alert and oriented times 3 Heart: regular rate and rythm Abdomen: soft, non-tender, non-distended, normal bowel sounds Extrremites: wrapped in ace bandages, 1+ edema bilaterally  Lab Results: Recent Labs    10/01/19 0452 10/02/19 0753  WBC 9.6 10.6*  HGB 13.2 13.5  PLT 73* 64*  MCV 101.8* 101.3*   Recent Labs    10/01/19 0452 10/02/19 2104  NA 144 141  K 5.0 4.9  CL 108 106  CO2 26 25  GLUCOSE 132* 116*  BUN 86* 79*  CREATININE 2.98* 2.53*  CALCIUM 8.5* 8.2*   Recent Labs    10/01/19 0452 10/02/19 2104  PROT 6.4* 6.0*  ALBUMIN 1.7* 1.7*  AST 106* 122*  ALT 97* 108*  ALKPHOS 137* 127*  BILITOT 5.1* 5.7*   Recent Labs    10/02/19 2104  INR 1.5*     Studies/Results: CT ABDOMEN PELVIS WO CONTRAST  Result Date: 10/02/2019 CLINICAL DATA:  Abdominal distension EXAM: CT ABDOMEN AND PELVIS WITHOUT CONTRAST TECHNIQUE: Multidetector CT imaging of the abdomen and pelvis was performed following the standard protocol without IV contrast. COMPARISON:  HIDA scan 09/30/2019, ultrasound 09/27/2019 FINDINGS: Lower chest: Lung bases are clear. Hepatobiliary: There is intrahepatic biliary duct dilatation with the LEFT and RIGHT hepatic lobe. There is hypoattenuation liver parenchyma at the confluence of the LEFT and RIGHT bile duct systems (image 29/3). No clear common bile duct dilatation although the common bile duct difficult to follow on this  noncontrast exam. Gallbladder is normal diameter at 2.2 cm. Large volume intraperitoneal free fluid surrounding the liver. This fluid is simple fluid attenuation. Pancreas: No pancreatic duct dilatation. No pancreatic inflammation. Spleen: Spleen is normal volume. Adrenals/urinary tract: Adrenal glands and kidneys are normal. The ureters and bladder normal. Bladder collapsed around Foley catheter. The Stomach/Bowel: Stomach, duodenum small-bowel normal. Terminal ileum is normal. Appendix normal. Moderate volume stool in the ascending transverse colon. Rectosigmoid colon normal. Vascular/Lymphatic: Abdominal aorta is normal caliber with atherosclerotic calcification. There is no retroperitoneal or periportal lymphadenopathy. No pelvic lymphadenopathy. Reproductive: Unremarkable Other: RIGHT volume of free fluid surrounds the spleen as well as the LEFT hepatic lobe collects in the pelvis. Musculoskeletal: No aggressive osseous lesion. IMPRESSION: 1. No evidence of bowel obstruction. 2. Intrahepatic biliary duct dilatation within LEFT and RIGHT hepatic lobe with central low attenuation surrounding the confluence of the ductal systems. Difficult interpretation with no IV contrast however concern for centrally obstructing lesion such as infiltrating cholangiocarcinoma. If patient cannot receive IV contrast a MRI without contrast may provide further characterization; however, MRIs on in-patients can be problematic due to difficulty breath holding on lung sequences. 3. The common bile duct is not dilated. The pancreas appears normal. 4. Large volume of intraperitoneal free fluid is favored related to hepatic dysfunction. Electronically Signed   By: Suzy Bouchard M.D.   On: 10/02/2019 20:03   DG Abd 1 View  Result Date: 10/01/2019 CLINICAL DATA:  Abdomen distension  EXAM: ABDOMEN - 1 VIEW COMPARISON:  None. FINDINGS: Air distension of what appears to be cecum in the right mid abdomen up to 14.8 cm. Remainder of the  gas pattern is unremarkable. No radiopaque calculi are visualized. IMPRESSION: Air distension of what appears to be cecum in the right mid abdomen up to 14.8 cm, raising possibility of cecal bascule/cecal obstruction. CT may be considered for further evaluation. These results will be called to the ordering clinician or representative by the Radiologist Assistant, and communication documented in the PACS or [employer]. Electronically Signed   By: Donavan Foil M.D.   On: 10/01/2019 16:25    Medications: Scheduled Meds: . carvedilol  3.125 mg Oral BID WC  . Chlorhexidine Gluconate Cloth  6 each Topical BID  . doxycycline  100 mg Oral Q12H  . DULoxetine  60 mg Oral Daily  . finasteride  5 mg Oral Daily  . gatifloxacin  1 drop Left Eye QID  . hydrocortisone sod succinate (SOLU-CORTEF) inj  12.5 mg Intravenous Q8H  . levofloxacin  500 mg Oral Q48H  . loratadine  10 mg Oral Daily  . mirabegron ER  50 mg Oral Daily  . neomycin-polymyxin b-dexamethasone  1 application Left Eye QID  . nystatin  5 mL Oral QID  . prednisoLONE acetate  1 drop Left Eye QID  . senna-docusate  1 tablet Oral BID  . sodium zirconium cyclosilicate  10 g Oral Daily  . torsemide  20 mg Oral Daily   Continuous Infusions: PRN Meds:.acetaminophen, bisacodyl, guaiFENesin-dextromethorphan, lidocaine, lip balm, oxyCODONE, prochlorperazine **OR** prochlorperazine **OR** prochlorperazine   Assessment/Plan: 74 y.o. male with elevated liver tests, known Chronic Hep C, dilated biliary tree  He believes he was told he had cirrhosis by [facility] hepatology team in the past around time of 2 failed attempts to eradicate his Hep C.  LFTs in 2019 at [facility] showed normal bili. LFTs since admission here 2 weeks ago have been quite a bit higher, T bili steady around 5-6.  He has moderate to large amt of ascites.  He and his daughter understand that he probably has cirrhosis and may have a cancer in his liver.  The both agree to further testing  to understand this all better.  We are ordering a diagnostic paracentesis to include testing for cytology.  We are ordering an MRI of the liver without IV contrast (because of his Cr) it still may give [place] decent information.    He has been declining functionally at home for almost 2 years from [place], this will obviously complicate any treatment options if he indeed has an underlying hepatobiliary malignancy (which I think he probably does have).     Milus Banister, MD  10/03/2019, 10:27 AM [facility] Pager [phone]

## 2019-10-03 NOTE — Procedures (Signed)
PROCEDURE SUMMARY:  Successful US guided paracentesis from RLQ.  Yielded 1.3 L of clear yellow fluid.  No immediate complications.  Pt tolerated well.   Specimen was sent for labs.  EBL < 59mL  Ascencion Dike PA-C 10/03/2019 2:14 PM

## 2019-10-03 NOTE — Progress Notes (Signed)
Patient ID: Nicholas Ramirez, male   DOB: [birth year], 74 y.o.   MRN: [id number] [facility] Progress Note   Assessment/ Plan:   1. Acute kidney Injury: nonoliguric and with seemingly plateaued renal function overnight.  Etiology of acute kidney injury is thought to be multifactorial but predominantly hemodynamically mediated by hypotension possibly with evolution to ATN in the setting of ongoing HCTZ/ACE inhibitor and transient urine retention.  Improvement of renal function noted on labs yesterday with a downtrending creatinine and had an exuberant response with significant polyuria on low-dose torsemide-we will continue this with cautious monitoring for electrolyte wasting. 2.  Hyperkalemia: Seen on previous labs but improving with increased urine output/polyuria. 3.  Hypertension: Blood pressures marginally elevated on low-dose carvedilol-monitor response to beginning diuretic. 4.  Ruptured globe left eye: Secondary to mechanical fall/trauma and status post surgical repair.  On antibiotics-levofloxacin and doxycycline. 5.  Recurrent falls/recent diagnosis of Parkinson disease.  Admitted to CIR for ongoing rehabilitation efforts. 6.  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  CBD patency could not be confirmed and additional evaluation recommended.  Subjective:   Reports no additional abdominal pain overnight, denies any nausea, vomiting or diarrhea.   Objective:   BP 121/85 (BP Location: Left Arm)   Pulse 85   Temp 98.6 F (37 C)   Resp 17   Wt 107 kg   SpO2 96%   BMI 34.85 kg/m   Intake/Output Summary (Last 24 hours) at 10/03/2019 1030 Last data filed at 10/03/2019 0552 Gross per 24 hour  Intake 0 ml  Output 3950 ml  Net -3950 ml   Weight change:   Physical Exam: Gen: Sitting up in a wheelchair with  daughter by his side.  Wearing shield over left eye. CVS: Pulse regular tachycardia, S1 and S2 normal Resp: Poor inspiratory effort with decreased breath sounds over bases, no distinct rales Abd: Soft, obese, nontender Ext: 2+-3+ lower extremity edema, lower extremities in wraps.  Imaging: CT ABDOMEN PELVIS WO CONTRAST  Result Date: 10/02/2019 CLINICAL DATA:  Abdominal distension EXAM: CT ABDOMEN AND PELVIS WITHOUT CONTRAST TECHNIQUE: Multidetector CT imaging of the abdomen and pelvis was performed following the standard protocol without IV contrast. COMPARISON:  HIDA scan 09/30/2019, ultrasound 09/27/2019 FINDINGS: Lower chest: Lung bases are clear. Hepatobiliary: There is intrahepatic biliary duct dilatation with the LEFT and RIGHT hepatic lobe. There is hypoattenuation liver parenchyma at the confluence of the LEFT and RIGHT bile duct systems (image 29/3). No clear common bile duct dilatation although the common bile duct difficult to follow on this noncontrast exam. Gallbladder is normal diameter at 2.2 cm. Large volume intraperitoneal free fluid surrounding the liver. This fluid is simple fluid attenuation. Pancreas: No pancreatic duct dilatation. No pancreatic inflammation. Spleen: Spleen is normal volume. Adrenals/urinary tract: Adrenal glands and kidneys are normal. The ureters and bladder normal. Bladder collapsed around Foley catheter. The Stomach/Bowel: Stomach, duodenum small-bowel normal. Terminal ileum is normal. Appendix normal. Moderate volume stool in the ascending transverse colon. Rectosigmoid colon normal. Vascular/Lymphatic: Abdominal aorta is normal caliber with atherosclerotic calcification. There is no retroperitoneal or periportal lymphadenopathy. No pelvic lymphadenopathy. Reproductive: Unremarkable Other: RIGHT volume of free fluid surrounds the spleen as well as the LEFT hepatic lobe collects in the pelvis. Musculoskeletal: No aggressive osseous lesion. IMPRESSION: 1. No evidence  of bowel obstruction. 2. Intrahepatic biliary duct dilatation within LEFT and RIGHT hepatic  lobe with central low attenuation surrounding the confluence of the ductal systems. Difficult interpretation with no IV contrast however concern for centrally obstructing lesion such as infiltrating cholangiocarcinoma. If patient cannot receive IV contrast a MRI without contrast may provide further characterization; however, MRIs on in-patients can be problematic due to difficulty breath holding on lung sequences. 3. The common bile duct is not dilated. The pancreas appears normal. 4. Large volume of intraperitoneal free fluid is favored related to hepatic dysfunction. Electronically Signed   By: Suzy Bouchard M.D.   On: 10/02/2019 20:03   DG Abd 1 View  Result Date: 10/01/2019 CLINICAL DATA:  Abdomen distension EXAM: ABDOMEN - 1 VIEW COMPARISON:  None. FINDINGS: Air distension of what appears to be cecum in the right mid abdomen up to 14.8 cm. Remainder of the gas pattern is unremarkable. No radiopaque calculi are visualized. IMPRESSION: Air distension of what appears to be cecum in the right mid abdomen up to 14.8 cm, raising possibility of cecal bascule/cecal obstruction. CT may be considered for further evaluation. These results will be called to the ordering clinician or representative by the Radiologist Assistant, and communication documented in the PACS or [employer]. Electronically Signed   By: Donavan Foil M.D.   On: 10/01/2019 16:25    Labs: BMET Recent Labs  Lab 09/26/19 1045 09/27/19 1050 09/28/19 0354 09/29/19 1001 09/30/19 0429 10/01/19 0452 10/02/19 2104  NA 137 137 139 143 141 144 141  K 5.0 5.3* 6.0* 5.1 5.1 5.0 4.9  CL 107 106 107 110 108 108 106  CO2 19* 22 24 24 24 26 25   GLUCOSE 170* 150* 120* 128* 118* 132* 116*  BUN 89* 88* 86* 78* 82* 86* 79*  CREATININE 4.04* 3.51* 3.29* 2.95* 2.91* 2.98* 2.53*  CALCIUM 8.3* 8.1* 8.1* 8.4* 8.4* 8.5* 8.2*  PHOS 6.0* 5.4*  --  5.3*   --   --   --    CBC Recent Labs  Lab 09/27/19 1050 09/27/19 1050 09/28/19 0821 09/29/19 1001 10/01/19 0452 10/02/19 0753  WBC 8.5  --   --  11.2* 9.6 10.6*  NEUTROABS  --   --   --   --  8.0* 8.6*  HGB 12.0*   < > 12.6* 13.5 13.2 13.5  HCT 33.8*   < > 36.1* 39.8 38.6* 39.4  MCV 98.8  --   --  101.0* 101.8* 101.3*  PLT 94*  --   --  96* 73* 64*   < > = values in this interval not displayed.    Medications:    . carvedilol  3.125 mg Oral BID WC  . Chlorhexidine Gluconate Cloth  6 each Topical BID  . doxycycline  100 mg Oral Q12H  . DULoxetine  60 mg Oral Daily  . finasteride  5 mg Oral Daily  . gatifloxacin  1 drop Left Eye QID  . hydrocortisone sod succinate (SOLU-CORTEF) inj  12.5 mg Intravenous Q8H  . levofloxacin  500 mg Oral Q48H  . loratadine  10 mg Oral Daily  . mirabegron ER  50 mg Oral Daily  . neomycin-polymyxin b-dexamethasone  1 application Left Eye QID  . nystatin  5 mL Oral QID  . prednisoLONE acetate  1 drop Left Eye QID  . senna-docusate  1 tablet Oral BID  . sodium zirconium cyclosilicate  10 g Oral Daily  . torsemide  20 mg Oral Daily   Elmarie Shiley, MD 10/03/2019, 10:30 AM

## 2019-10-03 NOTE — Progress Notes (Signed)
Occupational Therapy Session Note  Patient Details  Name: Nicholas Ramirez MRN: [id number] Date of Birth: June 07, 1945  Today's Date: 10/03/2019 OT Individual Time: 1445-1535 OT Individual Time Calculation (min): 50 min   Attempted to see patient at scheduled time for therapy session - he is currently at MRI  - missed 25 minutes of 75 minute session.   Short Term Goals: Week 1:  OT Short Term Goal 1 (Week 1): Pt will transition sit to stand with most appropriate AD with Max of 1 to assist with LB ADLs OT Short Term Goal 2 (Week 1): Pt will perform static stands for 1-3 minutes with no more than Mod Ax1  for upright standing OT Short Term Goal 3 (Week 1): Pt will need no more than Min verbal cues to decrease Left lateral lean and orient to midline OT Short Term Goal 4 (Week 1): Pt will perform UB dress with Mod A  Skilled Therapeutic Interventions/Progress Updates:    patient returned from MRI via w/c - he is alert but fatigued and having trouble keeping right eye open (left eye covered with shield)   He notes pain in left eye at times.  He is able to provide information related to fall and is aware of month.  Completed seated trunk mobility activities at w/c level.  Sit to stand in stedy - 2 attempts with assist of one but unable to power up to standing.  3rd attempts with mod A of 2 successful.  Transitioned to bed level via stedy.  Unsupported sitting with CS/CGA.  Sit to supine max A of 2.  Completed right UE AROM activity, left shoulder/scapular mobilization and AAROM/gentle stretch without pain.  Provided lap tray for w/c positioning and improved elevation for edema control.  Bilateral LEs unwrapped.  UEs positioned on pillows.  Call bell in reach and daughter present at close of session.    Therapy Documentation Precautions:  Precautions Precautions: Fall, Other (comment) (L eye shield all times) Precaution Comments: pt admitted for a fall, daughter reports 15-20 in last 6  months Restrictions Weight Bearing Restrictions: No   Therapy/Group: Individual Therapy  Carlos Levering 10/03/2019, 8:00 AM

## 2019-10-03 NOTE — Progress Notes (Signed)
Patient ID: Nicholas Ramirez, male   DOB: 1945-10-29, 74 y.o.   MRN: [id number] CT unremarkable, recommend putting patient back on bowel regimen (I stopped much of this yesterday due to concern about cecum on plain film), can advance diet as tolerated. There is no surgical intervention needed. He will need the biliary dilation evaluated for an obstruction as recommended- this may need to be done as outpatient. Doubt he would ever be a surgical candidate for this.  Will sign off.

## 2019-10-03 NOTE — Progress Notes (Signed)
Physical Therapy Session Note  Patient Details  Name: Blase Beckner MRN: [id number] Date of Birth: [birth year]  Today's Date: 10/03/2019 PT Individual Time: 0900-1015 PT Individual Time Calculation (min): 75 min   Short Term Goals: Week 1:  PT Short Term Goal 1 (Week 1): Patient will remain sitting with R UE support for >2 mins with no more than MinA x1 PT Short Term Goal 2 (Week 1): Patient will transfer bed<> wc with no more than ModA x2 PT Short Term Goal 3 (Week 1): Patient will be able to complete bed mobility with MaxA x1 Week 2:     Skilled Therapeutic Interventions/Progress Updates:    PAIN c/o pain w/end range shoulder flexion bilat, treatment to tolerance.  Pt initially supine w/daughter at bedside.  Pt awake, eye closed, patch intact. In supine worked on lower trunk rotation AAROM w/mild end range stretching w/progressively increased ROM achieved. Supine to side to sit w/mod assist  In sitting, pt initially w/mod post L lean.   Worked on reaching forward to collect blocks place ahead of pt and transfer to bin placed on floor - performed to decrease post lean, promote trunk moblility.  Then placed blocks to side and worked on [employer] trunk rotation/stretch while reaching. Pt able to sit w/cga w/hands in lap.  Therapist set up [facility], pt lifts feet w/mod to max assist to place on footplate.  UEs placed on pull up bar of stedy AAROM. STS from elevated bed to [facility] w/mod assist, max tactile and verbal cues for upright posture.  Pt briefly achives full stand w/mild L lean but limited endurance and gradually returns to semistand in [place].   Repeated x 3 total efforts, transferred to wc last effort.   During rest breaks of first 2 standing trials/pt requires trunk support in sitting and slow prolonged stretch to promote trunk extension performed by therapist.  Once seated in wc, wc fully tilted and pt able to scoot hips to back of seat.  In recline, cervical ROM, chest stretch,  hamstring, gastroc and bilat UE  stretching performed by therapist.   Daughter instructed w/operation of brakes and tilt feature and discussed need to change positions due to discomfort and for pressure relief purposes.  Daughter demonstrated safe operation of TIS, agreed to notify nursing if pt needed to return to bed.  Discussed safest transfer w/nursing.  Pt encouraged to try to stay oob in wc for lunch as first solid meal to be provided today.  Pt left oob in wc w/chair in comfortable tilt, daughter at side, and needs in reach . Daughter explained that pt was totally independent and very active PTA, no AD needed.  Will continue to encourage moblity w/pt.  Therapy Documentation Precautions:  Precautions Precautions: Fall, Other (comment) (L eye shield all times) Precaution Comments: pt admitted for a fall, daughter reports 15-20 in last 6 months Restrictions Weight Bearing Restrictions: No    Therapy/Group: Individual Therapy  Callie Fielding, [place] 10/03/2019, 3:52 PM

## 2019-10-03 NOTE — Discharge Instructions (Signed)
Inpatient Rehab Discharge Instructions  Mikaele Stecher Discharge date and time: 10/03/19   Activities/Precautions/ Functional Status: Activity: no lifting, driving, or strenuous exercise for till cleared by MD Diet:  Wound Care: none needed   Functional status:  ___ No restrictions     ___ Walk up steps independently ___ 24/7 supervision/assistance   ___ Walk up steps with assistance ___ Intermittent supervision/assistance  ___ Bathe/dress independently ___ Walk with walker     ___ Bathe/dress with assistance ___ Walk Independently    ___ Shower independently ___ Walk with assistance    ___ Shower with assistance ___ No alcohol     ___ Return to work/school ________  Special Instructions:    My questions have been answered and I understand these instructions. I will adhere to these goals and the provided educational materials after my discharge from the hospital.  Patient/Caregiver Signature _______________________________ Date __________  Clinician Signature _______________________________________ Date __________  Please bring this form and your medication list with you to all your follow-up doctor's appointments.

## 2019-10-04 ENCOUNTER — Inpatient Hospital Stay (HOSPITAL_COMMUNITY): Payer: Medicare Other | Admitting: Occupational Therapy

## 2019-10-04 ENCOUNTER — Inpatient Hospital Stay (HOSPITAL_COMMUNITY): Payer: Medicare Other

## 2019-10-04 ENCOUNTER — Encounter (HOSPITAL_COMMUNITY): Payer: Self-pay | Admitting: Certified Registered Nurse Anesthetist

## 2019-10-04 ENCOUNTER — Inpatient Hospital Stay (HOSPITAL_COMMUNITY): Payer: Medicare Other | Admitting: Speech Pathology

## 2019-10-04 DIAGNOSIS — C221 Intrahepatic bile duct carcinoma: Secondary | ICD-10-CM

## 2019-10-04 DIAGNOSIS — C240 Malignant neoplasm of extrahepatic bile duct: Secondary | ICD-10-CM

## 2019-10-04 DIAGNOSIS — D696 Thrombocytopenia, unspecified: Secondary | ICD-10-CM

## 2019-10-04 LAB — RENAL FUNCTION PANEL
Albumin: 1.7 g/dL — ABNORMAL LOW (ref 3.5–5.0)
Anion gap: 16 — ABNORMAL HIGH (ref 5–15)
BUN: 79 mg/dL — ABNORMAL HIGH (ref 8–23)
CO2: 29 mmol/L (ref 22–32)
Calcium: 8 mg/dL — ABNORMAL LOW (ref 8.9–10.3)
Chloride: 97 mmol/L — ABNORMAL LOW (ref 98–111)
Creatinine, Ser: 2.71 mg/dL — ABNORMAL HIGH (ref 0.61–1.24)
GFR calc Af Amer: 26 mL/min — ABNORMAL LOW (ref >60)
GFR calc non Af Amer: 22 mL/min — ABNORMAL LOW (ref >60)
Glucose, Bld: 142 mg/dL — ABNORMAL HIGH (ref 70–99)
Phosphorus: 5 mg/dL — ABNORMAL HIGH (ref 2.5–4.6)
Potassium: 3.2 mmol/L — ABNORMAL LOW (ref 3.5–5.1)
Sodium: 142 mmol/L (ref 135–145)

## 2019-10-04 LAB — CBC WITH DIFFERENTIAL/PLATELET
Abs Immature Granulocytes: 0.16 10*3/uL — ABNORMAL HIGH (ref 0.00–0.07)
Basophils Absolute: 0 10*3/uL (ref 0.0–0.1)
Basophils Relative: 0 %
Eosinophils Absolute: 0.1 10*3/uL (ref 0.0–0.5)
Eosinophils Relative: 1 %
HCT: 43.3 % (ref 39.0–52.0)
Hemoglobin: 15.1 g/dL (ref 13.0–17.0)
Immature Granulocytes: 1 %
Lymphocytes Relative: 12 %
Lymphs Abs: 1.6 10*3/uL (ref 0.7–4.0)
MCH: 35 pg — ABNORMAL HIGH (ref 26.0–34.0)
MCHC: 34.9 g/dL (ref 30.0–36.0)
MCV: 100.2 fL — ABNORMAL HIGH (ref 80.0–100.0)
Monocytes Absolute: 1 10*3/uL (ref 0.1–1.0)
Monocytes Relative: 7 %
Neutro Abs: 10 10*3/uL — ABNORMAL HIGH (ref 1.7–7.7)
Neutrophils Relative %: 79 %
Platelets: 57 10*3/uL — ABNORMAL LOW (ref 150–400)
RBC: 4.32 MIL/uL (ref 4.22–5.81)
RDW: 17.4 % — ABNORMAL HIGH (ref 11.5–15.5)
WBC: 12.8 10*3/uL — ABNORMAL HIGH (ref 4.0–10.5)
nRBC: 0 % (ref 0.0–0.2)

## 2019-10-04 LAB — IRON AND TIBC
Iron: 125 ug/dL (ref 45–182)
Saturation Ratios: 69 % — ABNORMAL HIGH (ref 17.9–39.5)
TIBC: 181 ug/dL — ABNORMAL LOW (ref 250–450)
UIBC: 56 ug/dL

## 2019-10-04 LAB — CYTOLOGY - NON PAP

## 2019-10-04 LAB — FERRITIN: Ferritin: 768 ng/mL — ABNORMAL HIGH (ref 24–336)

## 2019-10-04 IMAGING — MR MR MRCP
10 of 12 series · 42 of 48 positions shown · non-contrast
Comparison: [DATE] CT abdomen/pelvis.

CLINICAL DATA: Inpatient. Cirrhosis. Renal failure. Concern for
central liver mass on recent noncontrast CT.

EXAM:
MRI ABDOMEN WITHOUT CONTRAST  (INCLUDING MRCP)
TECHNIQUE: Multiplanar multisequence MR imaging of the abdomen was performed.
Heavily T2-weighted images of the biliary and pancreatic ducts were
obtained, and three-dimensional MRCP images were rendered by post
processing.

[Series 4: ax haste · axial · 6.0mm · 1.25mm/px · z∈[-235,+82]mm · 2 of 45 slices shown]
[im 1/45]
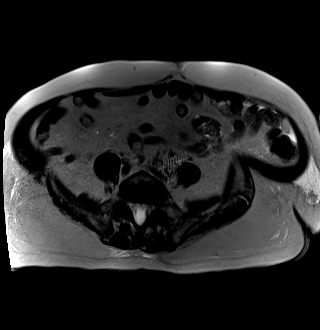
[im 45/45]
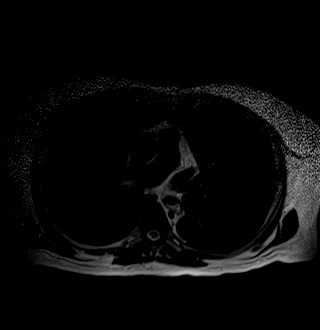

[Series 5: bSSFP · coronal · 6.0mm · 0.78mm/px · 2 of 36 slices shown]
[im 1/36]
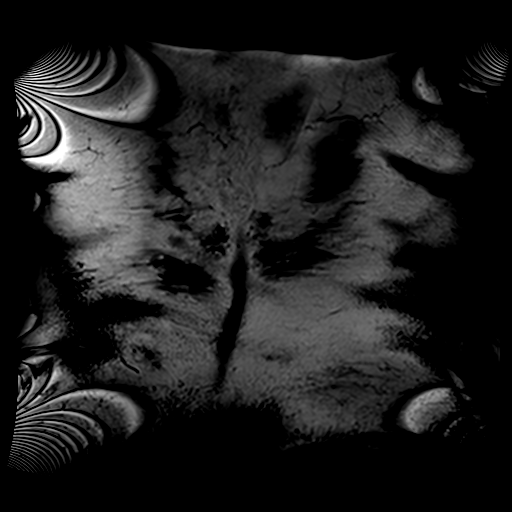
[im 36/36]
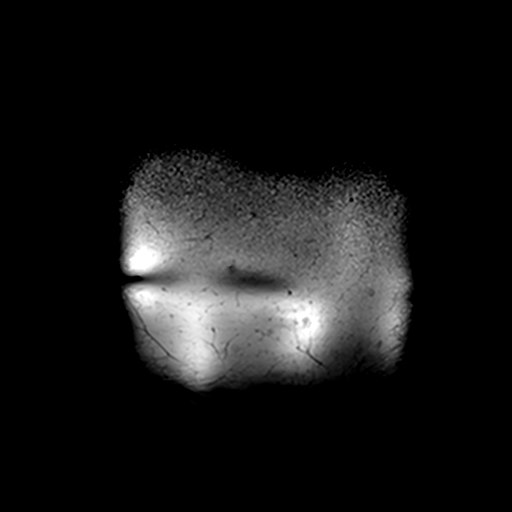

[Series 8: T2 fat-sat · axial · 6.0mm · 1.25mm/px · z∈[-218,+85]mm · 3 of 43 slices shown]
[im 1/43]
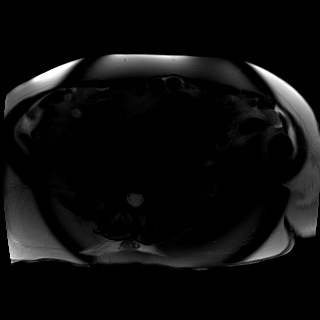
[im 22/43]
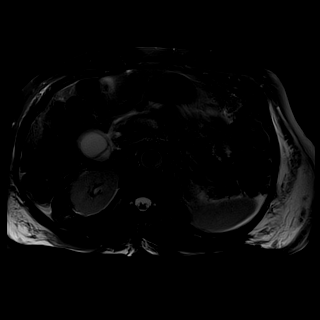
[im 43/43]
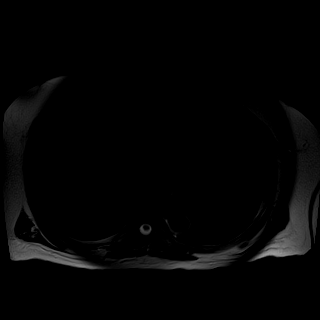

[Series 9: DWI · axial · 6.0mm · 1.49mm/px · z∈[-203,+100]mm · 9 of 129 slices shown (1 of 2)]
[im 1/129]
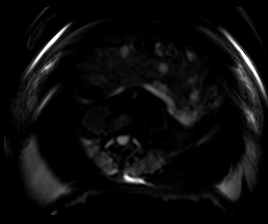
[im 17/129]
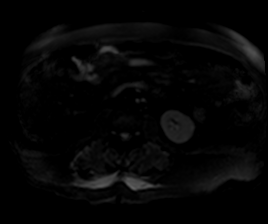
[im 33/129]
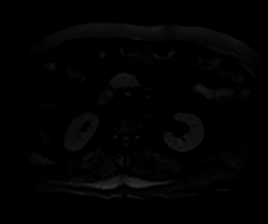
[im 49/129]
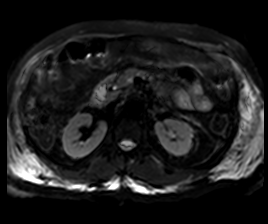
[im 65/129]
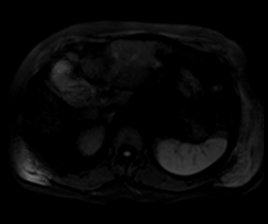
[im 81/129]
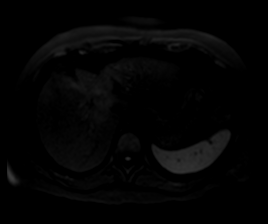
[im 97/129]
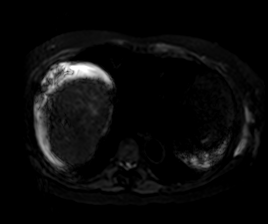
[im 113/129]
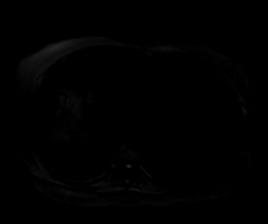
[im 129/129]
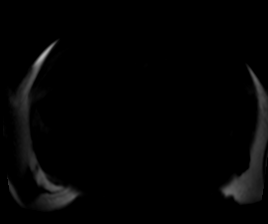

[Series 10: DWI · axial · 6.0mm · 1.49mm/px · z∈[-203,+100]mm · 3 of 43 slices shown (2 of 2)]
[im 1/43]
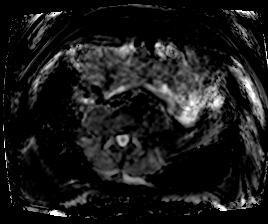
[im 22/43]
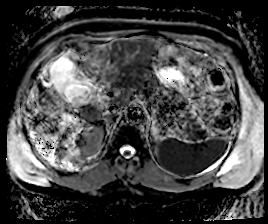
[im 43/43]
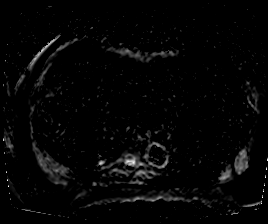

[Series 11: ax in and · axial · 3.0mm · 1.25mm/px · z∈[-198,+87]mm · 7 of 96 slices shown (1 of 2)]
[im 1/96]
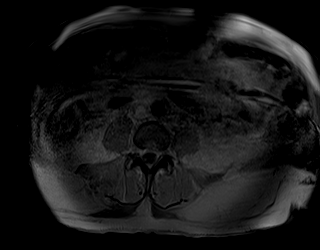
[im 16/96]
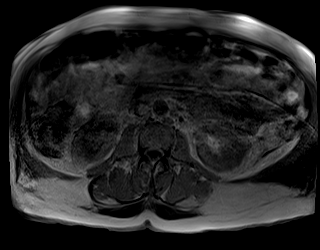
[im 32/96]
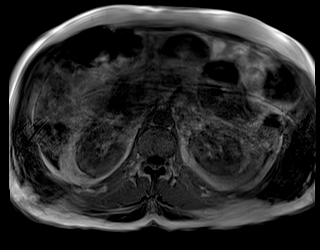
[im 48/96]
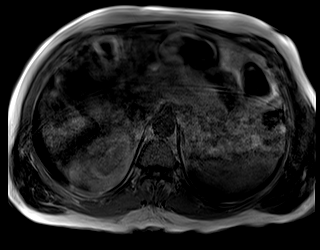
[im 64/96]
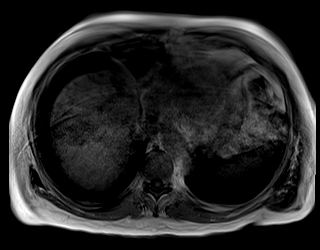
[im 80/96]
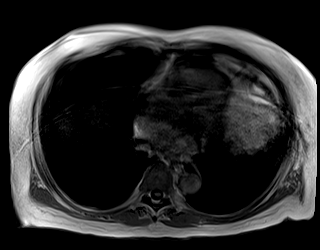
[im 96/96]
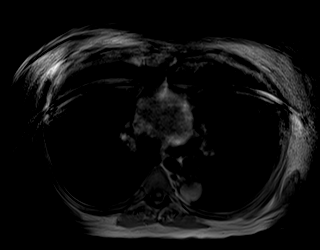

[Series 11: ax in and · axial · 3.0mm · 1.25mm/px · z∈[-198,+87]mm · 7 of 96 slices shown (2 of 2)]
[im 1/96]
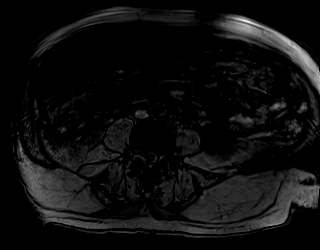
[im 16/96]
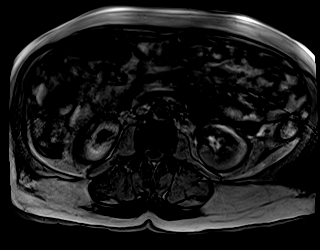
[im 32/96]
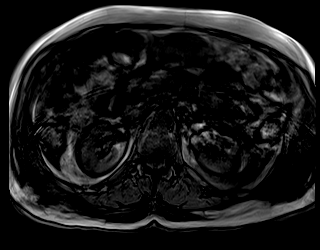
[im 48/96]
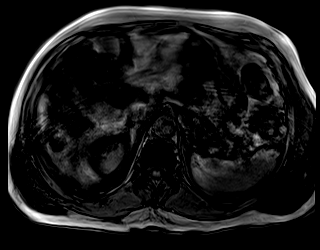
[im 64/96]
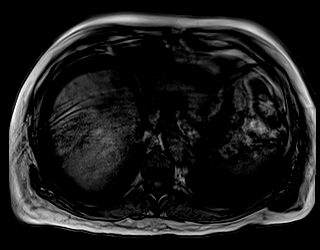
[im 80/96]
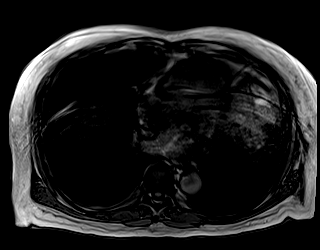
[im 96/96]
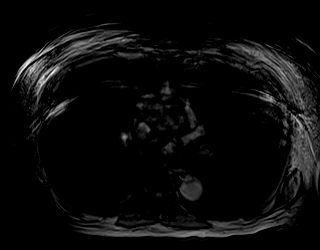

[Series 16: MRCP · coronal · 4.0mm · 1.25mm/px · 1 of 19 slices shown]
[im 1/19]
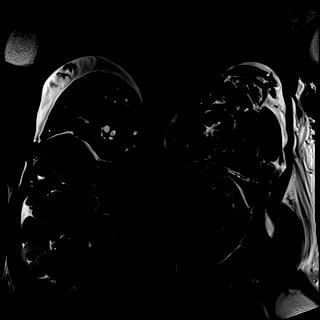

[Series 18: T1 dynamic · axial · non-contrast · 3.0mm · 1.25mm/px · z∈[-206,+103]mm · 7 of 104 slices shown]
[im 1/104]
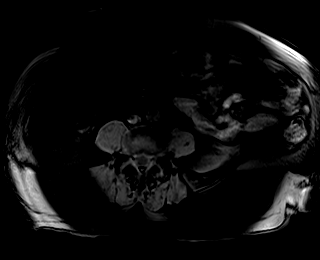
[im 18/104]
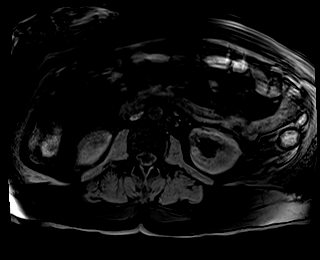
[im 35/104]
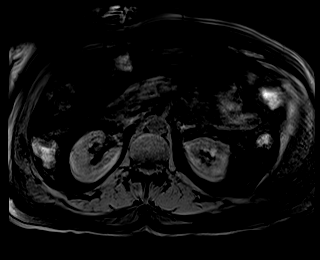
[im 52/104]
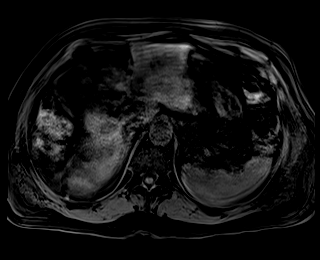
[im 69/104]
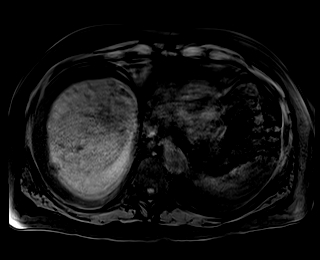
[im 86/104]
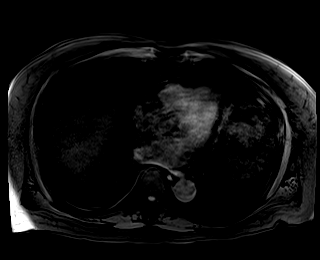
[im 104/104]
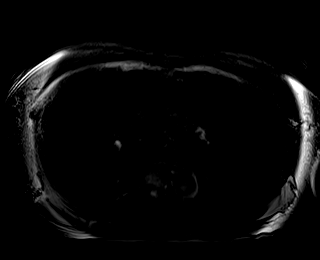

[Series 19: radials · coronal · 50.0mm · 0.89mm/px · 1 of 5 slices shown]
[im 1/5]
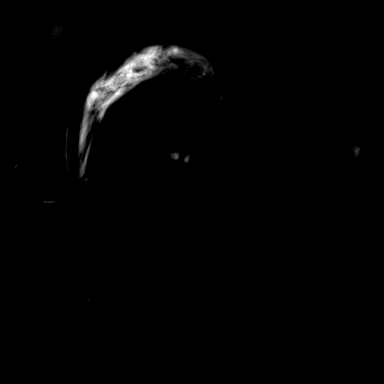

[42 of 48 positions shown; findings below may reference images not displayed]

FINDINGS: Scan is limited by motion degradation.

Lower chest: No acute abnormality at the lung bases.

Hepatobiliary: Diffusely irregular liver contour compatible with
cirrhosis. No hepatic steatosis. There is diffuse marked
intrahepatic biliary ductal dilatation with abrupt caliber
transition at the level of biliary hilum near the central
liver/porta hepatis, with normal caliber common bile duct (4 mm
diameter). There is vague T2 hyperintensity and restricted diffusion
in the central liver at the site of biliary obstruction, spanning
approximately 7.5 x 6.8 cm in maximum axial dimensions (series
9/image 103). Several small simple appearing liver cysts, largest
1.5 cm peripherally in the right liver. Mildly distended gallbladder
with mild diffuse gallbladder wall thickening. No cholelithiasis. No
evidence of choledocholithiasis.

Pancreas: No pancreatic mass or duct dilation.  No pancreas divisum.

Spleen: Normal size. No mass.

Adrenals/Urinary Tract: Normal adrenals. No hydronephrosis. Normal
kidneys with no renal mass.

Stomach/Bowel: Normal non-distended stomach. Visualized small and
large bowel is normal caliber, with no bowel wall thickening.

Vascular/Lymphatic: Normal caliber abdominal aorta. No
pathologically enlarged lymph nodes in the abdomen.

Other: Small to moderate volume ascites, predominantly perihepatic.
No focal fluid collection.

Musculoskeletal: No aggressive appearing focal osseous lesions.
IMPRESSION: 1. Limited motion degraded noncontrast MRI.
2. Diffuse marked intrahepatic biliary ductal dilatation with abrupt
caliber transition at the level of the biliary hilum near the
central liver/porta hepatis. Vague T2 hyperintensity and restricted
diffusion in the central liver at the site of biliary obstruction,
spanning approximately 7.5 x 6.8 cm in maximum axial dimensions.
Findings are suspicious for cholangiocarcinoma/Klatskin tumor. GI
consultation and consideration of ERCP suggested.
3. Morphologic changes of cirrhosis. Small to moderate volume
ascites.
4. Mild diffuse gallbladder wall thickening, nonspecific, likely due
to noninflammatory edema. No cholelithiasis. No evidence of
choledocholithiasis.

## 2019-10-04 MED ORDER — VITAMIN K1 10 MG/ML IJ SOLN
10.0000 mg | Freq: Once | INTRAMUSCULAR | Status: AC
  Administered 2019-10-04: 10 mg via SUBCUTANEOUS
  Filled 2019-10-04: qty 1

## 2019-10-04 NOTE — Progress Notes (Signed)
Physical Therapy Session Note  Patient Details  Name: Nicholas Ramirez MRN: [id number] Date of Birth: Nov 26, 1945  Today's Date: 10/04/2019 PT Individual Time: 0915-1011 PT Individual Time Calculation (min): 56 min   Short Term Goals: Week 1:  PT Short Term Goal 1 (Week 1): Patient will remain sitting with R UE support for >2 mins with no more than MinA x1 PT Short Term Goal 2 (Week 1): Patient will transfer bed<> wc with no more than ModA x2 PT Short Term Goal 3 (Week 1): Patient will be able to complete bed mobility with MaxA x1  Skilled Therapeutic Interventions/Progress Updates:   Received pt supine in bed asleep with daughter present at bedside, upon wakening pt agreeable to therapy but required extensive convincing, and reported pain in L eye but did not state level. Repositioning and rest breaks done to reduce pain levels. Pt continues to keep eyes closed during approximately 95% of session. Session with emphasis on dressing, functional mobility/transfers, generalized strengthening, dynamic standing balance/coordination, and improved activity tolerance. Applied bilateral ace wraps along LEs for compression and edema management and non-skid socks with total A as pt unable to lift LEs. GI MD present to discuss pt current medical status and suggested palliative care involvement. Donned pants in supine with max A +2 and pt rolled L and R with max A and use of bedrails and transferred R sidelying<>sitting EOB with mod A +2 for safety. Pt with mild L lateral lean but able to correct with max verbal and visual cues. Pt transferred sit<>stand in [place] from elevated mat with mod A +2 with total A to maintain L UE on [place] bar and cues for anterior weight shifting, hip extension, and upright posture. Dependent transfer to TIS WC in [place] with +2 assist. Pt transferred sit<>stand mod +2 from semi-stand position in [place] and noted pt with soiled brief. Pt performed 3 additional sit<>stands from semi-standing  position in [place] and required +3 assist to doff dirty brief, perform peri-care dependently, and don clean brief and pull pants over hips while standing in [place]. Pt reported "I can't do any more" after stand 2 and required extensive encouragement and rest breaks in order to get brief/pants completely over hips on stand 3. On stand 3 pt returned to sitting in TIS WC. Doffed dirty gown and donned clean pull over shirt sitting in TIS WC with max A. Concluded session with pt semi-reclined in TIS WC, needs within reach, and seatbelt alarm on.   Therapy Documentation Precautions:  Precautions Precautions: Fall, Other (comment) (L eye shield all times) Precaution Comments: pt admitted for a fall, daughter reports 15-20 in last 6 months Restrictions Weight Bearing Restrictions: No  Therapy/Group: Individual Therapy Alfonse Alpers PT, DPT   10/04/2019, 7:29 AM

## 2019-10-04 NOTE — Progress Notes (Signed)
[facility] Progress Note    Since last GI note: MRI and paracentesis yesterday.  Ascites fluid shows no SBP, SAAG unable to be calculated because lab has reported the ascites fluid a <1.  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  He ate breakfast this AM  Objective: Vital signs in last 24 hours: Temp:  [97.9 F (36.6 C)-98.5 F (36.9 C)] 98.2 F (36.8 C) (10/01 0315) Pulse Rate:  [83-93] 92 (10/01 0315) Resp:  [18-19] 19 (10/01 0315) BP: (113-144)/(72-97) 144/97 (10/01 0315) SpO2:  [95 %-96 %] 95 % (10/01 0315) Weight:  [97.1 kg] 97.1 kg (10/01 0500) Last BM Date: 10/03/19 General: alert and oriented times 1 Heart: regular rate and rythm Abdomen: soft, non-tender, non-distended, normal bowel sounds  Lab Results: Recent Labs    10/02/19 0753 10/03/19 2002  WBC 10.6* 10.2  HGB 13.5 13.7  PLT 64* 61*  MCV 101.3* 100.8*   Recent Labs    10/02/19 2104 10/03/19 2002  NA 141 140  K 4.9 3.7  CL 106 96*  CO2 25 32  GLUCOSE 116* 138*  BUN 79* 79*  CREATININE 2.53* 2.85*  CALCIUM 8.2* 8.1*   Recent Labs    10/02/19 2104  PROT 6.0*  ALBUMIN 1.7*  AST 122*  ALT 108*  ALKPHOS 127*  BILITOT 5.7*   Recent Labs    10/02/19 2104  INR 1.5*    Studies/Results: CT ABDOMEN PELVIS WO CONTRAST  Result Date: 10/02/2019 CLINICAL DATA:  Abdominal distension EXAM: CT ABDOMEN AND PELVIS WITHOUT CONTRAST TECHNIQUE: Multidetector CT imaging of the abdomen and pelvis was performed following the standard protocol without IV contrast. COMPARISON:  HIDA scan 09/30/2019, ultrasound 09/27/2019 FINDINGS: Lower chest: Lung bases are clear. Hepatobiliary: There is intrahepatic biliary duct dilatation with the LEFT and RIGHT hepatic lobe. There is hypoattenuation liver parenchyma at the confluence of the LEFT and RIGHT bile duct systems (image 29/3). No clear common bile  duct dilatation although the common bile duct difficult to follow on this noncontrast exam. Gallbladder is normal diameter at 2.2 cm. Large volume intraperitoneal free fluid surrounding the liver. This fluid is simple fluid attenuation. Pancreas: No pancreatic duct dilatation. No pancreatic inflammation. Spleen: Spleen is normal volume. Adrenals/urinary tract: Adrenal glands and kidneys are normal. The ureters and bladder normal. Bladder collapsed around Foley catheter. The Stomach/Bowel: Stomach, duodenum small-bowel normal. Terminal ileum is normal. Appendix normal. Moderate volume stool in the ascending transverse colon. Rectosigmoid colon normal. Vascular/Lymphatic: Abdominal aorta is normal caliber with atherosclerotic calcification. There is no retroperitoneal or periportal lymphadenopathy. No pelvic lymphadenopathy. Reproductive: Unremarkable Other: RIGHT volume of free fluid surrounds the spleen as well as the LEFT hepatic lobe collects in the pelvis. Musculoskeletal: No aggressive osseous lesion. IMPRESSION: 1. No evidence of bowel obstruction. 2. Intrahepatic biliary duct dilatation within LEFT and RIGHT hepatic lobe with central low attenuation surrounding the confluence of the ductal systems. Difficult interpretation with no IV contrast however concern for centrally obstructing lesion such as infiltrating cholangiocarcinoma. If patient cannot receive IV contrast a MRI without contrast may provide further characterization; however, MRIs on in-patients can be problematic due to difficulty breath holding on lung sequences. 3. The common bile duct is not dilated. The pancreas appears normal. 4. Large volume of intraperitoneal free fluid is favored related to hepatic dysfunction. Electronically Signed   By: Suzy Bouchard M.D.   On: 10/02/2019 20:03  MR ABDOMEN MRCP WO CONTRAST  Result Date: 10/04/2019 CLINICAL DATA:  Inpatient. Cirrhosis. Renal failure. Concern for central liver mass on recent  noncontrast CT. EXAM: MRI ABDOMEN WITHOUT CONTRAST  (INCLUDING MRCP) TECHNIQUE: Multiplanar multisequence MR imaging of the abdomen was performed. Heavily T2-weighted images of the biliary and pancreatic ducts were obtained, and three-dimensional MRCP images were rendered by post processing. COMPARISON:  10/02/2019 CT abdomen/pelvis. FINDINGS: Scan is limited by motion degradation. Lower chest: No acute abnormality at the lung bases. Hepatobiliary: Diffusely irregular liver contour compatible with cirrhosis. No hepatic steatosis. There is diffuse marked intrahepatic biliary ductal dilatation with abrupt caliber transition at the level of biliary hilum near the central liver/porta hepatis, with normal caliber common bile duct (4 mm diameter). There is vague T2 hyperintensity and restricted diffusion in the central liver at the site of biliary obstruction, spanning approximately 7.5 x 6.8 cm in maximum axial dimensions (series 9/image 103). Several small simple appearing liver cysts, largest 1.5 cm peripherally in the right liver. Mildly distended gallbladder with mild diffuse gallbladder wall thickening. No cholelithiasis. No evidence of choledocholithiasis. Pancreas: No pancreatic mass or duct dilation.  No pancreas divisum. Spleen: Normal size. No mass. Adrenals/Urinary Tract: Normal adrenals. No hydronephrosis. Normal kidneys with no renal mass. Stomach/Bowel: Normal non-distended stomach. Visualized small and large bowel is normal caliber, with no bowel wall thickening. Vascular/Lymphatic: Normal caliber abdominal aorta. No pathologically enlarged lymph nodes in the abdomen. Other: Small to moderate volume ascites, predominantly perihepatic. No focal fluid collection. Musculoskeletal: No aggressive appearing focal osseous lesions. IMPRESSION: 1. Limited motion degraded noncontrast MRI. 2. Diffuse marked intrahepatic biliary ductal dilatation with abrupt caliber transition at the level of the biliary hilum near  the central liver/porta hepatis. Vague T2 hyperintensity and restricted diffusion in the central liver at the site of biliary obstruction, spanning approximately 7.5 x 6.8 cm in maximum axial dimensions. 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 GI consultation and consideration of ERCP suggested. 3. Morphologic changes of cirrhosis. Small to moderate volume ascites. 4. Mild diffuse gallbladder wall thickening, nonspecific, likely due to noninflammatory edema. No cholelithiasis. No evidence of choledocholithiasis. Electronically Signed   By: Ilona Sorrel M.D.   On: 10/04/2019 07:47   IR Paracentesis  Result Date: 10/03/2019 INDICATION: Abdominal distention. Newly found ascites. Request diagnostic and therapeutic paracentesis. EXAM: ULTRASOUND GUIDED RIGHT LOWER QUADRANT PARACENTESIS MEDICATIONS: 1% plain lidocaine, 10 mL COMPLICATIONS: None immediate. PROCEDURE: Informed written consent was obtained from the patient after a discussion of the risks, benefits and alternatives to treatment. A timeout was performed prior to the initiation of the procedure. Initial ultrasound scanning demonstrates a small to moderate amount of ascites within the right lower abdominal quadrant. There is additional perihepatic and perisplenic ascites high in the abdomen. The right lower abdomen was prepped and draped in the usual sterile fashion. 1% lidocaine was used for local anesthesia. Following this, a 19 gauge, 7-cm, Yueh catheter was introduced. An ultrasound image was saved for documentation purposes. The paracentesis was performed. The catheter was removed and a dressing was applied. The patient tolerated the procedure well without immediate post procedural complication. FINDINGS: A total of approximately 1.3 L of clear yellow fluid was removed. Samples were sent to the laboratory as requested by the clinical team. IMPRESSION: Successful ultrasound-guided paracentesis yielding 1.3 liters of peritoneal  fluid. Read by: Ascencion Dike PA-C Electronically Signed   By: Corrie Mckusick D.O.   On: 10/03/2019 14:16    Medications: Scheduled Meds: . carvedilol  3.125 mg Oral  BID WC  . Chlorhexidine Gluconate Cloth  6 each Topical BID  . DULoxetine  60 mg Oral Daily  . finasteride  5 mg Oral Daily  . gatifloxacin  1 drop Left Eye QID  . hydrocortisone sod succinate (SOLU-CORTEF) inj  12.5 mg Intravenous Q8H  . loratadine  10 mg Oral Daily  . mirabegron ER  50 mg Oral Daily  . neomycin-polymyxin b-dexamethasone  1 application Left Eye QID  . nystatin  5 mL Oral QID  . prednisoLONE acetate  1 drop Left Eye QID  . senna-docusate  1 tablet Oral BID  . 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  I discussed his case his is daughter and him in the room.  Shortly afterwards I was told by staff that his daughter was not allowed to be in his room.  I then called his wife, she confirmed that she is the POA and that she did not want me to discuss anything with his daughter.  I apologized for the confusion and explained that I was not aware of any issues with the daughter and that from now on I will only communicate with her.  He has cirrhosis and a large central tumor in his liver causing Klatskin type obstruction.  I recommended biliary decompression attempt with ERCP and same time stricture sampling.  She understands that the procedure carries risk (pancreatitis, bleeding, perforation, etc) may be technically difficult and that if ERCP is not successful IR would be contacted. She understands and agrees. ERCP currently on for tomorrow AM around 10.   Milus Banister, MD   10/04/2019, 10:27 AM [facility] Pager [phone]

## 2019-10-04 NOTE — Progress Notes (Signed)
Occupational Therapy Session Note  Patient Details  Name: Nicholas Ramirez MRN: [id number] Date of Birth: [birth year]  Today's Date: 10/04/2019 OT Individual Time: 9450-3888 OT Individual Time Calculation (min): 30 min  and Today's Date: 10/04/2019 OT Missed Time: 15 Minutes Missed Time Reason: Patient fatigue   Short Term Goals: Week 1:  OT Short Term Goal 1 (Week 1): Pt will transition sit to stand with most appropriate AD with Max of 1 to assist with LB ADLs OT Short Term Goal 2 (Week 1): Pt will perform static stands for 1-3 minutes with no more than Mod Ax1  for upright standing OT Short Term Goal 3 (Week 1): Pt will need no more than Min verbal cues to decrease Left lateral lean and orient to midline OT Short Term Goal 4 (Week 1): Pt will perform UB dress with Mod A  Skilled Therapeutic Interventions/Progress Updates:    Treatment session with focus on arousal, following 1 step commands, and trunk control during sit <> stand and unsupported sitting at EOB.  Pt received tilted back in TIS w/c, briefly opening Rt eye to therapist interaction.  Attempted to engage pt in weight shifting in w/c to prepare for sit > stand in [place].  Pt required total assist to reach for [place] bar with RUE and support under LUE to facilitate weight shift.  Attempted sit > stand in [place] with total assist +2 with inability to lift buttocks to partial stand.  Required +3 for sit > stand in steady with facilitation for anterior weight shift bilaterally and 3rd person behind to boost at hips.  Pt with decreased ability to maintain Rt eye open even when upright.  Noted persistent Lt lean in sitting and standing in [place].  Lt lean in unsupported sitting on EOB.  Due to decreased arousal and difficulty following commands, pt returned to supine total +2.  Boosted up in bed +2 and positioned semi-reclined with pillows under BUE for edema management and improved positioning. Pt remained with eyes closed upon exit.  Therapy  Documentation Precautions:  Precautions Precautions: Fall, Other (comment) (L eye shield all times) Precaution Comments: pt admitted for a fall, daughter reports 15-20 in last 6 months Restrictions Weight Bearing Restrictions: No General: General OT Amount of Missed Time: 15 Minutes Pain:  Pt with no c/o pain, however pt with minimal interaction during session.   Therapy/Group: Individual Therapy  Simonne Come 10/04/2019, 3:06 PM

## 2019-10-04 NOTE — Progress Notes (Signed)
Patient ID: Nicholas Ramirez, male   DOB: 29-Aug-1945, 74 y.o.   MRN: [id number] [facility] Progress Note   Assessment/ Plan:   1. Acute kidney Injury: Polyuric overnight with significant net negative fluid balance of 7.5 L and concomitant rise of creatinine.  Will discontinue torsemide at this time and allow for auto diuresis suspecting that he is in the recovery phase of ATN (ischemic from relative hypotension).  He does not have any acute electrolyte abnormality but is developing some contraction alkalosis from net negative fluid balance that should improve with discontinuation of diuresis.  Will consider intravenous fluids based on subsequent labs. 2.  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  MRI done earlier today showed diffuse marked intrahepatic biliary duct dilatation with transition near the central liver as well as findings consistent with cholangiocarcinoma/Klatskin tumor.  Consequently seen by gastroenterology and plans in place for ERCP attempt for biliary decompression and stricture sampling tomorrow. 3.  Hypertension: Blood pressures marginally elevated on low-dose carvedilol-we will monitor with ongoing diuresis. 4.  Ruptured globe left eye: Secondary to mechanical fall/trauma and status post surgical repair.  On antibiotics-levofloxacin and doxycycline. 5.  Recurrent falls/recent diagnosis of Parkinson disease.  Admitted to CIR for ongoing rehabilitation efforts. 6.  Anemia: Possibly associated with acute illness versus newly uncovered malignancy.  We will hold off on ESA and check iron studies today.  Subjective:   Reports no additional abdominal pain overnight, denies any nausea, vomiting or diarrhea.   Objective:   BP (!) 144/97 (BP Location: Right Arm)   Pulse 92   Temp 98.2 F (36.8 C) (Oral)   Resp 19   Wt 97.1 kg    SpO2 95%   BMI 31.60 kg/m   Intake/Output Summary (Last 24 hours) at 10/04/2019 1131 Last data filed at 10/04/2019 0900 Gross per 24 hour  Intake 640 ml  Output 8200 ml  Net -7560 ml   Weight change:   Physical Exam: Gen: Appears to be comfortable resting in recliner.  Wearing shield over left eye. CVS: Pulse regular tachycardia, S1 and S2 normal Resp: Poor inspiratory effort with decreased breath sounds over bases, no distinct rales Abd: Soft, obese, nontender Ext: 2+ lower extremity edema, lower extremities in wraps.  Imaging: CT ABDOMEN PELVIS WO CONTRAST  Result Date: 10/02/2019 CLINICAL DATA:  Abdominal distension EXAM: CT ABDOMEN AND PELVIS WITHOUT CONTRAST TECHNIQUE: Multidetector CT imaging of the abdomen and pelvis was performed following the standard protocol without IV contrast. COMPARISON:  HIDA scan 09/30/2019, ultrasound 09/27/2019 FINDINGS: Lower chest: Lung bases are clear. Hepatobiliary: There is intrahepatic biliary duct dilatation with the LEFT and RIGHT hepatic lobe. There is hypoattenuation liver parenchyma at the confluence of the LEFT and RIGHT bile duct systems (image 29/3). No clear common bile duct dilatation although the common bile duct difficult to follow on this noncontrast exam. Gallbladder is normal diameter at 2.2 cm. Large volume intraperitoneal free fluid surrounding the liver. This fluid is simple fluid attenuation. Pancreas: No pancreatic duct dilatation. No pancreatic inflammation. Spleen: Spleen is normal volume. Adrenals/urinary tract: Adrenal glands and kidneys are normal. The ureters and bladder normal. Bladder collapsed around Foley catheter. The Stomach/Bowel: Stomach, duodenum small-bowel normal. Terminal ileum is normal. Appendix normal. Moderate volume stool in the ascending transverse colon. Rectosigmoid colon normal. Vascular/Lymphatic: Abdominal aorta is normal caliber with atherosclerotic calcification. There is no retroperitoneal or  periportal lymphadenopathy. No  pelvic lymphadenopathy. Reproductive: Unremarkable Other: RIGHT volume of free fluid surrounds the spleen as well as the LEFT hepatic lobe collects in the pelvis. Musculoskeletal: No aggressive osseous lesion. IMPRESSION: 1. No evidence of bowel obstruction. 2. Intrahepatic biliary duct dilatation within LEFT and RIGHT hepatic lobe with central low attenuation surrounding the confluence of the ductal systems. Difficult interpretation with no IV contrast however concern for centrally obstructing lesion such as infiltrating cholangiocarcinoma. If patient cannot receive IV contrast a MRI without contrast may provide further characterization; however, MRIs on in-patients can be problematic due to difficulty breath holding on lung sequences. 3. The common bile duct is not dilated. The pancreas appears normal. 4. Large volume of intraperitoneal free fluid is favored related to hepatic dysfunction. Electronically Signed   By: Suzy Bouchard M.D.   On: 10/02/2019 20:03   MR ABDOMEN MRCP WO CONTRAST  Result Date: 10/04/2019 CLINICAL DATA:  Inpatient. Cirrhosis. Renal failure. Concern for central liver mass on recent noncontrast CT. EXAM: MRI ABDOMEN WITHOUT CONTRAST  (INCLUDING MRCP) TECHNIQUE: Multiplanar multisequence MR imaging of the abdomen was performed. Heavily T2-weighted images of the biliary and pancreatic ducts were obtained, and three-dimensional MRCP images were rendered by post processing. COMPARISON:  10/02/2019 CT abdomen/pelvis. FINDINGS: Scan is limited by motion degradation. Lower chest: No acute abnormality at the lung bases. Hepatobiliary: Diffusely irregular liver contour compatible with cirrhosis. No hepatic steatosis. There is diffuse marked intrahepatic biliary ductal dilatation with abrupt caliber transition at the level of biliary hilum near the central liver/porta hepatis, with normal caliber common bile duct (4 mm diameter). There is vague T2 hyperintensity  and restricted diffusion in the central liver at the site of biliary obstruction, spanning approximately 7.5 x 6.8 cm in maximum axial dimensions (series 9/image 103). Several small simple appearing liver cysts, largest 1.5 cm peripherally in the right liver. Mildly distended gallbladder with mild diffuse gallbladder wall thickening. No cholelithiasis. No evidence of choledocholithiasis. Pancreas: No pancreatic mass or duct dilation.  No pancreas divisum. Spleen: Normal size. No mass. Adrenals/Urinary Tract: Normal adrenals. No hydronephrosis. Normal kidneys with no renal mass. Stomach/Bowel: Normal non-distended stomach. Visualized small and large bowel is normal caliber, with no bowel wall thickening. Vascular/Lymphatic: Normal caliber abdominal aorta. No pathologically enlarged lymph nodes in the abdomen. Other: Small to moderate volume ascites, predominantly perihepatic. No focal fluid collection. Musculoskeletal: No aggressive appearing focal osseous lesions. IMPRESSION: 1. Limited motion degraded noncontrast MRI. 2. Diffuse marked intrahepatic biliary ductal dilatation with abrupt caliber transition at the level of the biliary hilum near the central liver/porta hepatis. Vague T2 hyperintensity and restricted diffusion in the central liver at the site of biliary obstruction, spanning approximately 7.5 x 6.8 cm in maximum axial dimensions. 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 GI consultation and consideration of ERCP suggested. 3. Morphologic changes of cirrhosis. Small to moderate volume ascites. 4. Mild diffuse gallbladder wall thickening, nonspecific, likely due to noninflammatory edema. No cholelithiasis. No evidence of choledocholithiasis. Electronically Signed   By: Ilona Sorrel M.D.   On: 10/04/2019 07:47   IR Paracentesis  Result Date: 10/03/2019 INDICATION: Abdominal distention. Newly found ascites. Request diagnostic and therapeutic paracentesis. EXAM: ULTRASOUND GUIDED  RIGHT LOWER QUADRANT PARACENTESIS MEDICATIONS: 1% plain lidocaine, 10 mL COMPLICATIONS: None immediate. PROCEDURE: Informed written consent was obtained from the patient after a discussion of the risks, benefits and alternatives to treatment. A timeout was performed prior to the initiation of the procedure. Initial ultrasound scanning demonstrates a small to moderate amount of ascites within the  right lower abdominal quadrant. There is additional perihepatic and perisplenic ascites high in the abdomen. The right lower abdomen was prepped and draped in the usual sterile fashion. 1% lidocaine was used for local anesthesia. Following this, a 19 gauge, 7-cm, Yueh catheter was introduced. An ultrasound image was saved for documentation purposes. The paracentesis was performed. The catheter was removed and a dressing was applied. The patient tolerated the procedure well without immediate post procedural complication. FINDINGS: A total of approximately 1.3 L of clear yellow fluid was removed. Samples were sent to the laboratory as requested by the clinical team. IMPRESSION: Successful ultrasound-guided paracentesis yielding 1.3 liters of peritoneal fluid. Read by: Ascencion Dike PA-C Electronically Signed   By: Corrie Mckusick D.O.   On: 10/03/2019 14:16    Labs: BMET Recent Labs  Lab 09/28/19 0354 09/29/19 1001 09/30/19 0429 10/01/19 0452 10/02/19 2104 10/03/19 2002  NA 139 143 141 144 141 140  K 6.0* 5.1 5.1 5.0 4.9 3.7  CL 107 110 108 108 106 96*  CO2 24 24 24 26 25  32  GLUCOSE 120* 128* 118* 132* 116* 138*  BUN 86* 78* 82* 86* 79* 79*  CREATININE 3.29* 2.95* 2.91* 2.98* 2.53* 2.85*  CALCIUM 8.1* 8.4* 8.4* 8.5* 8.2* 8.1*  PHOS  --  5.3*  --   --   --   --    CBC Recent Labs  Lab 09/29/19 1001 10/01/19 0452 10/02/19 0753 10/03/19 2002  WBC 11.2* 9.6 10.6* 10.2  NEUTROABS  --  8.0* 8.6* 7.9*  HGB 13.5 13.2 13.5 13.7  HCT 39.8 38.6* 39.4 39.8  MCV 101.0* 101.8* 101.3* 100.8*  PLT 96* 73* 64*  61*    Medications:    . carvedilol  3.125 mg Oral BID WC  . Chlorhexidine Gluconate Cloth  6 each Topical BID  . DULoxetine  60 mg Oral Daily  . finasteride  5 mg Oral Daily  . gatifloxacin  1 drop Left Eye QID  . hydrocortisone sod succinate (SOLU-CORTEF) inj  12.5 mg Intravenous Q8H  . loratadine  10 mg Oral Daily  . mirabegron ER  50 mg Oral Daily  . neomycin-polymyxin b-dexamethasone  1 application Left Eye QID  . nystatin  5 mL Oral QID  . phytonadione  10 mg Subcutaneous Once  . prednisoLONE acetate  1 drop Left Eye QID  . senna-docusate  1 tablet Oral BID  . sodium zirconium cyclosilicate  10 g Oral Daily   Elmarie Shiley, MD 10/04/2019, 11:31 AM

## 2019-10-04 NOTE — Progress Notes (Signed)
Speech Language Pathology Daily Session Note  Patient Details  Name: Nicholas Ramirez MRN: [id number] Date of Birth: [birth year]  Today's Date: 10/04/2019 SLP Individual Time: 1130-1215 SLP Individual Time Calculation (min): 45 min  Short Term Goals: Week 1: SLP Short Term Goal 1 (Week 1): Patient will consume meal trays of Dys 2 (fine chop), thin liquids without overt s/s of aspiration or penetration and without significant oral delay with minA. SLP Short Term Goal 2 (Week 1): Patient will tolerate trials of upgraded solids with adequately efficient mastication and with no more than trace oral residuals post initial swallow. SLP Short Term Goal 3 (Week 1): Patient will demonstrate understanding of and effective use of learned swallow strategies with minA cues.  Skilled Therapeutic Interventions:   Patient seen for skilled ST session with focus on swallow function goals. Patient continues with lethargy but is able to maintain alertness throughout session. SLP observed patient with PO intake of Dys 2, thin liquids lunch meal. He exhibited prolonged mastication and oral transit with solid PO's but no significant oral residuals or pocketing observed. No coughing, throat clearing or overt s/s that would be concerning for aspiration were observed. SLP did get order for SLE but patient too lethargic to complete. He stated date as "19th", did not recall eating breakfast or having any testing that occurred recently this morning. Patient will benefit from continued SLP intervention to maximize swallow and cognitive function prior to discharge.  Pain Pain Assessment Pain Scale: Faces Faces Pain Scale: Hurts little more Pain Type: Acute pain Pain Location: Eye Pain Orientation: Left Pain Descriptors / Indicators: Aching;Discomfort Pain Intervention(s): RN made aware  Therapy/Group: Individual Therapy  Sonia Baller, MA, CCC-SLP Speech Therapy

## 2019-10-04 NOTE — Progress Notes (Signed)
Physical Therapy Session Note  Patient Details  Name: Nicholas Ramirez MRN: [id number] Date of Birth: Oct 28, 1945  Today's Date: 10/04/2019 PT Individual Time: 8250-0370 PT Individual Time Calculation (min): 42 min   Short Term Goals: Week 1:  PT Short Term Goal 1 (Week 1): Patient will remain sitting with R UE support for >2 mins with no more than MinA x1 PT Short Term Goal 2 (Week 1): Patient will transfer bed<> wc with no more than ModA x2 PT Short Term Goal 3 (Week 1): Patient will be able to complete bed mobility with MaxA x1  Skilled Therapeutic Interventions/Progress Updates:    PAIN  Pt denies pain but very lethargic this pm  Pt initially oob in wc, appears lethargic.  Opens eye for therapist and agrees to session. Pt transported to gym for standing activities. Pt set up in standing frame, harness positioned by therapist.  Mechanical lift to standing, total assist for positioning UEs on tray.  Pt tolerated 15 min standing in frame while engaging in cervical ROM, visual scanning, limited reaching but notably more lethargic today and max cues to participate, hand over hand assist to engage in reaching.   Placed hands on wall immediately in front of patient and pt able to press against wall to somewhat improve upright posture, stretching to Lwrist/hand in this position.  Pt lowered to sitting after 15 min as he was becoming very fatigued.  Harness removed and pt placed in full tilt to reposition, pt unable to assist w/scooting hips this pm.   Therapist performed stretching to bilat gastroce, hamstrings, LUE, and cervical spine, pt falling asleep during stretching.  Pt transported back to room, repositioned comfortably. Pt left oob in wc w/alarm belt set and needs in reach  Therapy Documentation Precautions:  Precautions Precautions: Fall, Other (comment) (L eye shield all times) Precaution Comments: pt admitted for a fall, daughter reports 15-20 in last 6 months Restrictions Weight  Bearing Restrictions: No   Therapy/Group: Individual Therapy  Callie Fielding, [place] 10/04/2019, 3:40 PM

## 2019-10-04 NOTE — Progress Notes (Signed)
Patient ID: Nicholas Ramirez, male   DOB: July 20, 1945, 74 y.o.   MRN: [id number]  [place] with wife via telephone who wants daughter out of there. She is very upset daughter has been told pt's medical condition and test results, due to both she and pt have kept her in the dark about his medical history. Now daughter is calling her young children and telling them her father is dying. MD has informed daughter to leave and not return. Wife is pt's next of kin and due to pt not fully capable of making his own decisions at this time, the decision making capabilities go to his wife. Daughter wants a psych eval and informed MD of this. Daughter contacted Education officer, museum and was irrate and yelling regarding being asked to leave the hospital. There appears to be family dynamic issues that are long standing with daughter and her Mother-pt's wife. Pt is daughter's step-father. Will continue to work with wife on best plan for pt and will keep updated. She has asked MD's to not share information with daughter and only to speak with her regarding his medical issues.

## 2019-10-04 NOTE — Progress Notes (Signed)
Large blister popped open on R leg with large amount of serous fluids out. Ace wrap removed. Patient cleaned and bed linens  changed. Blister covered with non-adherent dressing and kerlix.

## 2019-10-04 NOTE — Progress Notes (Addendum)
[place] PHYSICAL MEDICINE & REHABILITATION PROGRESS NOTE   Subjective/Complaints: Patient seen laying in his chair this morning.  Daughter at bedside.  He states he slept well overnight.  He does not recall his conversation with GI.  Prolonged discussion with daughter, wife as well as GI physician regarding plans.  Discussed with family family dynamics wife's desire to remove stepdaughter from access to patient.  He was seen by nephro yesterday, notes reviewed-no changes.  ROS: Denies CP, SOB, N/V/D  Objective:    CT ABDOMEN PELVIS WO CONTRAST  Result Date: 10/02/2019 CLINICAL DATA:  Abdominal distension EXAM: CT ABDOMEN AND PELVIS WITHOUT CONTRAST TECHNIQUE: Multidetector CT imaging of the abdomen and pelvis was performed following the standard protocol without IV contrast. COMPARISON:  HIDA scan 09/30/2019, ultrasound 09/27/2019 FINDINGS: Lower chest: Lung bases are clear. Hepatobiliary: There is intrahepatic biliary duct dilatation with the LEFT and RIGHT hepatic lobe. There is hypoattenuation liver parenchyma at the confluence of the LEFT and RIGHT bile duct systems (image 29/3). No clear common bile duct dilatation although the common bile duct difficult to follow on this noncontrast exam. Gallbladder is normal diameter at 2.2 cm. Large volume intraperitoneal free fluid surrounding the liver. This fluid is simple fluid attenuation. Pancreas: No pancreatic duct dilatation. No pancreatic inflammation. Spleen: Spleen is normal volume. Adrenals/urinary tract: Adrenal glands and kidneys are normal. The ureters and bladder normal. Bladder collapsed around Foley catheter. The Stomach/Bowel: Stomach, duodenum small-bowel normal. Terminal ileum is normal. Appendix normal. Moderate volume stool in the ascending transverse colon. Rectosigmoid colon normal. Vascular/Lymphatic: Abdominal aorta is normal caliber with atherosclerotic calcification. There is no retroperitoneal or periportal lymphadenopathy. No  pelvic lymphadenopathy. Reproductive: Unremarkable Other: RIGHT volume of free fluid surrounds the spleen as well as the LEFT hepatic lobe collects in the pelvis. Musculoskeletal: No aggressive osseous lesion. IMPRESSION: 1. No evidence of bowel obstruction. 2. Intrahepatic biliary duct dilatation within LEFT and RIGHT hepatic lobe with central low attenuation surrounding the confluence of the ductal systems. Difficult interpretation with no IV contrast however concern for centrally obstructing lesion such as infiltrating cholangiocarcinoma. If patient cannot receive IV contrast a MRI without contrast may provide further characterization; however, MRIs on in-patients can be problematic due to difficulty breath holding on lung sequences. 3. The common bile duct is not dilated. The pancreas appears normal. 4. Large volume of intraperitoneal free fluid is favored related to hepatic dysfunction. Electronically Signed   By: Suzy Bouchard M.D.   On: 10/02/2019 20:03   MR ABDOMEN MRCP WO CONTRAST  Result Date: 10/04/2019 CLINICAL DATA:  Inpatient. Cirrhosis. Renal failure. Concern for central liver mass on recent noncontrast CT. EXAM: MRI ABDOMEN WITHOUT CONTRAST  (INCLUDING MRCP) TECHNIQUE: Multiplanar multisequence MR imaging of the abdomen was performed. Heavily T2-weighted images of the biliary and pancreatic ducts were obtained, and three-dimensional MRCP images were rendered by post processing. COMPARISON:  10/02/2019 CT abdomen/pelvis. FINDINGS: Scan is limited by motion degradation. Lower chest: No acute abnormality at the lung bases. Hepatobiliary: Diffusely irregular liver contour compatible with cirrhosis. No hepatic steatosis. There is diffuse marked intrahepatic biliary ductal dilatation with abrupt caliber transition at the level of biliary hilum near the central liver/porta hepatis, with normal caliber common bile duct (4 mm diameter). There is vague T2 hyperintensity and restricted diffusion in the  central liver at the site of biliary obstruction, spanning approximately 7.5 x 6.8 cm in maximum axial dimensions (series 9/image 103). Several small simple appearing liver cysts, largest 1.5 cm peripherally in the right  liver. Mildly distended gallbladder with mild diffuse gallbladder wall thickening. No cholelithiasis. No evidence of choledocholithiasis. Pancreas: No pancreatic mass or duct dilation.  No pancreas divisum. Spleen: Normal size. No mass. Adrenals/Urinary Tract: Normal adrenals. No hydronephrosis. Normal kidneys with no renal mass. Stomach/Bowel: Normal non-distended stomach. Visualized small and large bowel is normal caliber, with no bowel wall thickening. Vascular/Lymphatic: Normal caliber abdominal aorta. No pathologically enlarged lymph nodes in the abdomen. Other: Small to moderate volume ascites, predominantly perihepatic. No focal fluid collection. Musculoskeletal: No aggressive appearing focal osseous lesions. IMPRESSION: 1. Limited motion degraded noncontrast MRI. 2. Diffuse marked intrahepatic biliary ductal dilatation with abrupt caliber transition at the level of the biliary hilum near the central liver/porta hepatis. Vague T2 hyperintensity and restricted diffusion in the central liver at the site of biliary obstruction, spanning approximately 7.5 x 6.8 cm in maximum axial dimensions. 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 GI consultation and consideration of ERCP suggested. 3. Morphologic changes of cirrhosis. Small to moderate volume ascites. 4. Mild diffuse gallbladder wall thickening, nonspecific, likely due to noninflammatory edema. No cholelithiasis. No evidence of choledocholithiasis. Electronically Signed   By: Ilona Sorrel M.D.   On: 10/04/2019 07:47   IR Paracentesis  Result Date: 10/03/2019 INDICATION: Abdominal distention. Newly found ascites. Request diagnostic and therapeutic paracentesis. EXAM: ULTRASOUND GUIDED RIGHT LOWER QUADRANT PARACENTESIS  MEDICATIONS: 1% plain lidocaine, 10 mL COMPLICATIONS: None immediate. PROCEDURE: Informed written consent was obtained from the patient after a discussion of the risks, benefits and alternatives to treatment. A timeout was performed prior to the initiation of the procedure. Initial ultrasound scanning demonstrates a small to moderate amount of ascites within the right lower abdominal quadrant. There is additional perihepatic and perisplenic ascites high in the abdomen. The right lower abdomen was prepped and draped in the usual sterile fashion. 1% lidocaine was used for local anesthesia. Following this, a 19 gauge, 7-cm, Yueh catheter was introduced. An ultrasound image was saved for documentation purposes. The paracentesis was performed. The catheter was removed and a dressing was applied. The patient tolerated the procedure well without immediate post procedural complication. FINDINGS: A total of approximately 1.3 L of clear yellow fluid was removed. Samples were sent to the laboratory as requested by the clinical team. IMPRESSION: Successful ultrasound-guided paracentesis yielding 1.3 liters of peritoneal fluid. Read by: Ascencion Dike PA-C Electronically Signed   By: Corrie Mckusick D.O.   On: 10/03/2019 14:16   Recent Labs    10/02/19 0753 10/03/19 2002  WBC 10.6* 10.2  HGB 13.5 13.7  HCT 39.4 39.8  PLT 64* 61*   Recent Labs    10/02/19 2104 10/03/19 2002  NA 141 140  K 4.9 3.7  CL 106 96*  CO2 25 32  GLUCOSE 116* 138*  BUN 79* 79*  CREATININE 2.53* 2.85*  CALCIUM 8.2* 8.1*    Intake/Output Summary (Last 24 hours) at 10/04/2019 1155 Last data filed at 10/04/2019 0900 Gross per 24 hour  Intake 640 ml  Output 8200 ml  Net -7560 ml        Physical Exam: Vital Signs Blood pressure (!) 144/97, pulse 92, temperature 98.2 F (36.8 C), temperature source Oral, resp. rate 19, weight 97.1 kg, SpO2 95 %. Constitutional: No distress . Vital signs reviewed. HENT: Normocephalic.   Atraumatic. Eyes: EOMI. No discharge. Cardiovascular: No JVD.  RRR.  + Murmur. Respiratory: Normal effort.  No stridor.  Bilateral clear to auscultation. GI: Non-distended.  BS +. Skin: Vascular changes bilateral lower extremities Psych: Flat.  Musc: Generalized edema. Left periorbital edema Neuro: Alert Dysarthria, unchanged Motor: RUE/RLE: 3+/5 proximal distal LUE: Limited due to significant tone/rigidity, appears to be 4/5 within available range of motion, unchanged LLE: Hip flexion, knee extension 1/5, ankle dorsiflexion 3/5  Assessment/Plan: 1. Functional deficits secondary to Parkinson's, with recent anterior left globe rupture which require 3+ hours per day of interdisciplinary therapy in a comprehensive inpatient rehab setting.  Physiatrist is providing close team supervision and 24 hour management of active medical problems listed below.  Physiatrist and rehab team continue to assess barriers to discharge/monitor patient progress toward functional and medical goals  Care Tool:  Bathing    Body parts bathed by patient: Right arm, Left arm, Chest, Abdomen, Face   Body parts bathed by helper:  (declined LB bathing at time of eval)     Bathing assist Assist Level: Total Assistance - Patient < 25%     Upper Body Dressing/Undressing Upper body dressing   What is the patient wearing?: Hospital gown only    Upper body assist Assist Level: Maximal Assistance - Patient 25 - 49%    Lower Body Dressing/Undressing Lower body dressing      What is the patient wearing?: Pants     Lower body assist Assist for lower body dressing: 2 Helpers     Toileting Toileting Toileting Activity did not occur (Clothing management and hygiene only):  (not performed at time of eval, has Foley for urine and no need for BM at time of eval)  Toileting assist Assist for toileting: Dependent - Patient 0%     Transfers Chair/bed transfer  Transfers assist     Chair/bed transfer assist  level: 2 Helpers     Locomotion Ambulation   Ambulation assist   Ambulation activity did not occur: Safety/medical concerns (fatigue/weakness)          Walk 10 feet activity   Assist  Walk 10 feet activity did not occur: Safety/medical concerns (fatigue/weakness)        Walk 50 feet activity   Assist Walk 50 feet with 2 turns activity did not occur: Safety/medical concerns (fatigue/weakness)         Walk 150 feet activity   Assist Walk 150 feet activity did not occur: Safety/medical concerns (fatigue/weakness)         Walk 10 feet on uneven surface  activity   Assist Walk 10 feet on uneven surfaces activity did not occur: Safety/medical concerns (fatigue/weakness)         Wheelchair     Assist Will patient use wheelchair at discharge?: Yes Type of Wheelchair: Manual    Wheelchair assist level: Dependent - Patient 0% (tilt in space wc does not allow for patient to propel himself)      Wheelchair 50 feet with 2 turns activity    Assist        Assist Level: Dependent - Patient 0% (tilt in space wc does not allow for patient to propel himself)   Wheelchair 150 feet activity     Assist      Assist Level: Maximal Assistance - Patient 25 - 49%   Medical Problem List and Plan: 1.Functional deficitssecondary to Parkinson's Gait disorder. Recent fall with rupture of anterior left globe.  Continue CIR 2. Antithrombotics: -DVT/anticoagulation:Mechanical:Antiembolism stockings, knee (TED hose) Bilateral lower extremities Sequential compression devices, below kneeBilateral lower extremities -antiplatelet therapy: n/a 3.Abdominal pain/Pain Management:Continue oxycodoneprn  Appears to be controlled with meds on 10/1  Monitor with increased exertion 4. Mood:team to provide ego  support -antipsychotic agents: n/a 5. Neuropsych: This patient is not capable of making decisions on his own behalf. 6.  Skin/Wound Care:local care as indicated 7. Fluids/Electrolytes/Nutrition:encourage appropriate po 8. Ruptured globe left eye s/p surgical repair 9/16 by ophthalmology Dr. Eulas Post -eye shield at all times -broad spectrum coverage transitioned tolevofloxacin and doxycycline, completed on 9/30 -continueeye gttsfor 2 additional weeks(~10/12) -outpt f/u with Dr. Eulas Post after discharge 9. HTN: -pt on lisinopril, amlodipine, propranolol at home -Coreg started on 9/27   Slightly labile on 10/1, monitor for trend  Moderate increased mobility  10. NID:[id number], likely related toATN v/svancomycin Mirabegron ER and finasteride resumed  Creatinine 2.71 on 10/1  Continue to monitor 11.Parkinson's Disease Sinemet added recently but not started per notes. Pt followed by Dr. Tomi Likens  Will consider initiation after completion of GI work-up 12. Hyponatremia, mild SIADH: Resolved 13. Prolonged QT interval: Avoid rate prolongation meds  ECG reviewed, showing persistent prolonged QTC 14. Constipation Senna-s two tabs at HS  Adjust bowel meds as necessary  Constipation improved 15.Distended abdomen HIDA scan negative.Some chronic findings due to abnl small bowel motility   LFTs remain elevated on 9/29  See #14 16.  Hyperkalemia  Potassium 3.2 on 10/1, labs ordered for tomorrow  Lokelma DC'd  Continue to monitor 17.  Hypoalbuminemia  Supplement initiated on 9/28 18.  Spasticity versus rigidity  See #11  Previously, thought was to consider neck MRI, however given recent findings this will not be indicated at this time 19.  Dysphagia  Continue D2 thins resumed on 9/30 20.  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  Thrombocytopenia  Platelets 61 on 9/30, labs ordered for tomorrow  >35 minutes spent in total with wife by telephone, PA, GI attending discussing imaging results, current plan of care, and prognosis.  Prolonged discussion with wife and daughter as well regarding visitation rights and power of attorney.  Wife is power of attorney and request that no information be shared with daughter and also requests visitation rights to be removed.  LOS: 4 days A FACE TO FACE EVALUATION WAS PERFORMED  Aqueelah Cotrell Lorie Phenix 10/04/2019, 11:55 AM

## 2019-10-05 ENCOUNTER — Inpatient Hospital Stay (HOSPITAL_COMMUNITY): Payer: Medicare Other | Admitting: Occupational Therapy

## 2019-10-05 ENCOUNTER — Inpatient Hospital Stay (HOSPITAL_COMMUNITY): Payer: Medicare Other

## 2019-10-05 ENCOUNTER — Encounter (HOSPITAL_COMMUNITY)
Admission: RE | Disposition: A | Discharge status: Other Acute Inpatient Hospital | Payer: Self-pay | Source: Intra-hospital | Attending: Physical Medicine & Rehabilitation

## 2019-10-05 ENCOUNTER — Inpatient Hospital Stay (HOSPITAL_COMMUNITY): Payer: Medicare Other | Admitting: Physical Therapy

## 2019-10-05 LAB — CBC WITH DIFFERENTIAL/PLATELET
Abs Immature Granulocytes: 0.08 10*3/uL — ABNORMAL HIGH (ref 0.00–0.07)
Basophils Absolute: 0 10*3/uL (ref 0.0–0.1)
Basophils Relative: 0 %
Eosinophils Absolute: 0.1 10*3/uL (ref 0.0–0.5)
Eosinophils Relative: 2 %
HCT: 37.8 % — ABNORMAL LOW (ref 39.0–52.0)
Hemoglobin: 13 g/dL (ref 13.0–17.0)
Immature Granulocytes: 1 %
Lymphocytes Relative: 14 %
Lymphs Abs: 1.3 10*3/uL (ref 0.7–4.0)
MCH: 34.8 pg — ABNORMAL HIGH (ref 26.0–34.0)
MCHC: 34.4 g/dL (ref 30.0–36.0)
MCV: 101.1 fL — ABNORMAL HIGH (ref 80.0–100.0)
Monocytes Absolute: 0.7 10*3/uL (ref 0.1–1.0)
Monocytes Relative: 7 %
Neutro Abs: 7 10*3/uL (ref 1.7–7.7)
Neutrophils Relative %: 76 %
Platelets: 47 10*3/uL — ABNORMAL LOW (ref 150–400)
RBC: 3.74 MIL/uL — ABNORMAL LOW (ref 4.22–5.81)
RDW: 17.2 % — ABNORMAL HIGH (ref 11.5–15.5)
WBC: 9.2 10*3/uL (ref 4.0–10.5)
nRBC: 0 % (ref 0.0–0.2)

## 2019-10-05 LAB — COMPREHENSIVE METABOLIC PANEL
ALT: 96 U/L — ABNORMAL HIGH (ref 0–44)
AST: 118 U/L — ABNORMAL HIGH (ref 15–41)
Albumin: 1.6 g/dL — ABNORMAL LOW (ref 3.5–5.0)
Alkaline Phosphatase: 105 U/L (ref 38–126)
Anion gap: 14 (ref 5–15)
BUN: 73 mg/dL — ABNORMAL HIGH (ref 8–23)
CO2: 34 mmol/L — ABNORMAL HIGH (ref 22–32)
Calcium: 7.7 mg/dL — ABNORMAL LOW (ref 8.9–10.3)
Chloride: 94 mmol/L — ABNORMAL LOW (ref 98–111)
Creatinine, Ser: 2.74 mg/dL — ABNORMAL HIGH (ref 0.61–1.24)
GFR calc Af Amer: 25 mL/min — ABNORMAL LOW (ref >60)
GFR calc non Af Amer: 22 mL/min — ABNORMAL LOW (ref >60)
Glucose, Bld: 116 mg/dL — ABNORMAL HIGH (ref 70–99)
Potassium: 3.1 mmol/L — ABNORMAL LOW (ref 3.5–5.1)
Sodium: 142 mmol/L (ref 135–145)
Total Bilirubin: 5.7 mg/dL — ABNORMAL HIGH (ref 0.3–1.2)
Total Protein: 5.5 g/dL — ABNORMAL LOW (ref 6.5–8.1)

## 2019-10-05 LAB — MRSA PCR SCREENING: MRSA by PCR: NEGATIVE

## 2019-10-05 LAB — PHOSPHORUS: Phosphorus: 4.8 mg/dL — ABNORMAL HIGH (ref 2.5–4.6)

## 2019-10-05 SURGERY — ERCP, WITH INTERVENTION IF INDICATED
Anesthesia: General

## 2019-10-05 MED ORDER — CIPROFLOXACIN IN D5W 400 MG/200ML IV SOLN
INTRAVENOUS | Status: AC
  Filled 2019-10-05: qty 200

## 2019-10-05 MED ORDER — POTASSIUM CHLORIDE 10 MEQ/100ML IV SOLN: 10.0000 meq | INTRAVENOUS | Status: DC

## 2019-10-05 MED ORDER — GLUCAGON HCL RDNA (DIAGNOSTIC) 1 MG IJ SOLR
INTRAMUSCULAR | Status: AC
  Filled 2019-10-05: qty 1

## 2019-10-05 MED ORDER — POTASSIUM CHLORIDE CRYS ER 20 MEQ PO TBCR
40.0000 meq | EXTENDED_RELEASE_TABLET | Freq: Once | ORAL | Status: AC
  Administered 2019-10-05: 40 meq via ORAL
  Filled 2019-10-05: qty 2

## 2019-10-05 MED ORDER — INDOMETHACIN 50 MG RE SUPP
RECTAL | Status: AC
  Filled 2019-10-05: qty 2

## 2019-10-05 NOTE — Progress Notes (Addendum)
[facility] Progress Note    Since last GI note: I cancelled his ERCP for today after discussion with anesthesia, optho and his wife about risk to his left eye.  IR is aware of him and his need for biliary decompression and sampling of the Klatskin tumor.  Objective: Vital signs in last 24 hours: Temp:  [97.9 F (36.6 C)-98.3 F (36.8 C)] 98.3 F (36.8 C) (10/02 0331) Pulse Rate:  [76-89] 85 (10/02 0756) Resp:  [16-18] 16 (10/02 0331) BP: (94-135)/(68-94) 135/94 (10/02 0756) SpO2:  [95 %-99 %] 95 % (10/02 0331) Weight:  [95.7 kg] 95.7 kg (10/02 0455) Last BM Date: 10/04/19 General: weak, frail, left eye bandage in place Heart: regular rate and rythm Abdomen: soft, non-tender, non-distended, normal bowel sounds  Lab Results: Recent Labs    10/03/19 2002 10/04/19 1118 10/05/19 0651  WBC 10.2 12.8* 9.2  HGB 13.7 15.1 13.0  PLT 61* 57* 47*  MCV 100.8* 100.2* 101.1*   Recent Labs    10/03/19 2002 10/04/19 1118 10/05/19 0651  NA 140 142 142  K 3.7 3.2* 3.1*  CL 96* 97* 94*  CO2 32 29 34*  GLUCOSE 138* 142* 116*  BUN 79* 79* 73*  CREATININE 2.85* 2.71* 2.74*  CALCIUM 8.1* 8.0* 7.7*   Recent Labs    10/02/19 2104 10/04/19 1118 10/05/19 0651  PROT 6.0*  --  5.5*  ALBUMIN 1.7* 1.7* 1.6*  AST 122*  --  118*  ALT 108*  --  96*  ALKPHOS 127*  --  105  BILITOT 5.7*  --  5.7*   Recent Labs    10/02/19 2104  INR 1.5*    Medications: Scheduled Meds: . carvedilol  3.125 mg Oral BID WC  . Chlorhexidine Gluconate Cloth  6 each Topical BID  . DULoxetine  60 mg Oral Daily  . finasteride  5 mg Oral Daily  . gatifloxacin  1 drop Left Eye QID  . loratadine  10 mg Oral Daily  . mirabegron ER  50 mg Oral Daily  . neomycin-polymyxin b-dexamethasone  1 application Left Eye QID  . nystatin  5 mL Oral QID  . potassium chloride  40 mEq Oral Once  . prednisoLONE acetate  1 drop Left Eye QID  . senna-docusate  1 tablet Oral BID   Continuous Infusions: PRN  Meds:.acetaminophen, bisacodyl, guaiFENesin-dextromethorphan, lidocaine (PF), lidocaine, lip balm, oxyCODONE, prochlorperazine **OR** prochlorperazine **OR** prochlorperazine   Assessment/Plan: 74 y.o. 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 AKI probably contributes to the extent of TB elevation.  He need biliary decompression, sampling of the central biliary tumor. This is non emergent.   ERCP with general anesthesia, laying prone with left side of face down carries extra risk to his left eye. IR considering perc bili drain, can be done with him on his back, deep or moderate sedation. Greatly appreciate IR help.  Certainly safe for this to be done early next week if needed.  We will follow along.   Milus Banister, MD  10/05/2019, 11:54 AM [facility] Pager [phone]

## 2019-10-05 NOTE — Consult Note (Signed)
Chief Complaint: Patient was seen in consultation today for image guided percutaneous transhepatic cholangiogram with internal/external biliary drain placement and brush biopsy.  Referring Physician(s): Owens Loffler  Supervising Physician: Corrie Mckusick  Patient Status: [facility] - In-pt  History of Present Illness: Nicholas Ramirez is a 74 y.o. male with a past medical history significant for depression, asthma, HTN and hepatitis C who presented to [facility] [place] ED on 09/18/19 with complaints of a fall with strike to his left eye and complete vision loss. He was found to have a ruptured left globe and taking to the OR by ophthalmology. He was admitted until 9/27 due to AKI, urinary retention, elevated bilirubin and LFTs however imaging was essentially normal. He was then transferred to CIR for further rehab. After arriving to CIR he developed abdominal pain and imaging showed possible obstruction - general surgery and GI were consulted and a CT scan was ordered. CT abd/pelvis w/o contrast showed intrahepatic biliary duct dilatation within the left and right hepatic lobes with central low attenuation surrounding the confluence of the ductal systems. 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 He was planned to undergo ERCP today with GI however during anesthesia evaluation there was concern that he would need to be prone during the procedure which could affect the ruptured left globe. IR has been asked to see patient for possible biliary drain placement with biopsy.  [name] seen in his room, his wife and RN are at bedside. He denies any pain or other complaints however he just received some pain medication per RN. Discussed procedure today with patient's wife Vicente Males  who is agreeable to proceed.  Past Medical History:  Diagnosis Date  . Asthma   . Depression   . Heart murmur   . Hepatitis C   . Hypertension     Past Surgical History:  Procedure Laterality Date  . CARPAL TUNNEL RELEASE Left   . IR PARACENTESIS  10/03/2019  . RUPTURED GLOBE EXPLORATION AND REPAIR Left 09/18/2019   Procedure: REPAIR OF RUPTURED GLOBE;  Surgeon: Lonia Skinner, MD;  Location: [place];  Service: Ophthalmology;  Laterality: Left;    Allergies: Oxybutynin, Sertraline, and Tadalafil  Medications: Prior to Admission medications   Medication Sig Start Date End Date Taking? Authorizing Provider  aspirin 81 MG EC tablet Take 81 mg by mouth daily.    [provider]  carbidopa-levodopa (SINEMET IR) 25-100 MG tablet Take 1/2 tablet in AM and bedtime for one week, then 1/2 tablet three times daily for one week, then take 1 tablet three times daily. 09/16/19   Pieter Partridge, DO  cetirizine (ZYRTEC) 10 MG tablet Take 10 mg by mouth daily.    [provider]  DULoxetine (CYMBALTA) 60 MG capsule Take 60 mg by mouth daily. 07/03/19   [provider]  finasteride (PROSCAR) 5 MG tablet Take 5 mg by mouth daily. 07/03/19   [provider]  gatifloxacin (ZYMAXID) 0.5 % SOLN Place 1 drop into the left eye 4 (four) times daily for 15 days. 09/30/19 10/15/19  Amin, Ankit Chirag, MD  MYRBETRIQ 50 MG TB24 tablet Take 50 mg by mouth daily. 09/02/19   [provider]  neomycin-polymyxin b-dexamethasone (MAXITROL) 3.5-10000-0.1 OINT Place 1 application into the left eye 4 (four) times daily for 15 days. 09/30/19 10/15/19  Amin, Ankit Chirag, MD  nystatin (MYCOSTATIN) 100000 UNIT/ML suspension Take 5 mLs (500,000 Units total) by mouth 4 (four) times daily for 7 days. 09/30/19 10/25/2019  Amin, Jeanella Flattery, MD  polyethylene glycol (MIRALAX / GLYCOLAX) 17 g packet Take 17 g by mouth 2 (two) times daily. 09/30/19   Amin, Jeanella Flattery, MD  prednisoLONE acetate (PRED  FORTE) 1 % ophthalmic suspension Place 1 drop into the left eye 4 (four) times daily for 15 days. 09/30/19 10/15/19  Amin, Jeanella Flattery, MD  propranolol (INDERAL) 40 MG tablet Take 40 mg by mouth 2 (two) times daily. 09/04/19   [provider]  senna-docusate (SENOKOT-S) 8.6-50 MG tablet Take 1 tablet by mouth 2 (two) times daily. 09/30/19   Damita Lack, MD     Family History  Problem Relation Age of Onset  . Alzheimer's disease Mother   . High blood pressure Mother   . Cancer Father     Social History   Socioeconomic History  . Marital status: Married    Spouse name: Not on file  . Number of children: Not on file  . Years of education: Not on file  . Highest education level: Not on file  Occupational History  . Not on file  Tobacco Use  . Smoking status: Former Smoker    Quit date: 1987    Years since quitting: 34.7  . Smokeless tobacco: Never Used  Substance and Sexual Activity  . Alcohol use: Never  . Drug use: Not Currently  . Sexual activity: Not on file  Other Topics Concern  . Not on file  Social History Narrative  . Not on file   Social Determinants of Health   Financial Resource Strain:   . Difficulty of Paying Living Expenses: Not on file  Food Insecurity:   . Worried About Charity fundraiser in the Last Year: Not on file  . Ran Out of Food in the Last Year: Not on file  Transportation Needs:   . Lack of Transportation (Medical): Not on file  . Lack of Transportation (Non-Medical): Not on file  Physical Activity:   . Days of Exercise per Week: Not on file  . Minutes of Exercise per Session: Not on file  Stress:   . Feeling of Stress : Not on file  Social Connections:   . Frequency of Communication with Friends and Family: Not on file  . Frequency of Social Gatherings with Friends and Family: Not on file  . Attends Religious Services: Not on file  . Active Member of Clubs or Organizations: Not on file  . Attends Archivist  Meetings: Not on file  . Marital Status: Not on file     Review of Systems: A 12 point ROS discussed and pertinent positives are indicated in the HPI above.  All other systems are negative.  Review of Systems  Unable to perform ROS: Mental status change    Vital Signs: BP (!) 135/94   Pulse 85   Temp 98.3 F (36.8 C) (Oral)   Resp 16   Wt 211 lb (95.7 kg)   SpO2 95%   BMI 31.16 kg/m   Physical Exam Vitals reviewed.  Constitutional:      General: He is not in acute distress. HENT:     Head: Normocephalic.     Mouth/Throat:     Mouth: Mucous membranes are moist.     Pharynx: Oropharynx is clear. No oropharyngeal exudate or posterior oropharyngeal erythema.  Cardiovascular:  Rate and Rhythm: Normal rate and regular rhythm.  Pulmonary:     Effort: Pulmonary effort is normal.     Breath sounds: Normal breath sounds.  Abdominal:     General: There is no distension.     Palpations: Abdomen is soft.     Tenderness: There is no abdominal tenderness.  Skin:    General: Skin is warm and dry.  Neurological:     Mental Status: He is alert. Mental status is at baseline.      MD Evaluation Airway: WNL Heart: WNL Abdomen: WNL Chest/ Lungs: WNL Other Pertinent Findings: Ruptured left globe ASA  Classification: 3 Mallampati/Airway Score: Two   Imaging: CT ABDOMEN PELVIS WO CONTRAST  Result Date: 10/02/2019 CLINICAL DATA:  Abdominal distension EXAM: CT ABDOMEN AND PELVIS WITHOUT CONTRAST TECHNIQUE: Multidetector CT imaging of the abdomen and pelvis was performed following the standard protocol without IV contrast. COMPARISON:  HIDA scan 09/30/2019, ultrasound 09/27/2019 FINDINGS: Lower chest: Lung bases are clear. Hepatobiliary: There is intrahepatic biliary duct dilatation with the LEFT and RIGHT hepatic lobe. There is hypoattenuation liver parenchyma at the confluence of the LEFT and RIGHT bile duct systems (image 29/3). No clear common bile duct dilatation although the  common bile duct difficult to follow on this noncontrast exam. Gallbladder is normal diameter at 2.2 cm. Large volume intraperitoneal free fluid surrounding the liver. This fluid is simple fluid attenuation. Pancreas: No pancreatic duct dilatation. No pancreatic inflammation. Spleen: Spleen is normal volume. Adrenals/urinary tract: Adrenal glands and kidneys are normal. The ureters and bladder normal. Bladder collapsed around Foley catheter. The Stomach/Bowel: Stomach, duodenum small-bowel normal. Terminal ileum is normal. Appendix normal. Moderate volume stool in the ascending transverse colon. Rectosigmoid colon normal. Vascular/Lymphatic: Abdominal aorta is normal caliber with atherosclerotic calcification. There is no retroperitoneal or periportal lymphadenopathy. No pelvic lymphadenopathy. Reproductive: Unremarkable Other: RIGHT volume of free fluid surrounds the spleen as well as the LEFT hepatic lobe collects in the pelvis. Musculoskeletal: No aggressive osseous lesion. IMPRESSION: 1. No evidence of bowel obstruction. 2. Intrahepatic biliary duct dilatation within LEFT and RIGHT hepatic lobe with central low attenuation surrounding the confluence of the ductal systems. Difficult interpretation with no IV contrast however concern for centrally obstructing lesion such as infiltrating cholangiocarcinoma. If patient cannot receive IV contrast a MRI without contrast may provide further characterization; however, MRIs on in-patients can be problematic due to difficulty breath holding on lung sequences. 3. The common bile duct is not dilated. The pancreas appears normal. 4. Large volume of intraperitoneal free fluid is favored related to hepatic dysfunction. Electronically Signed   By: Suzy Bouchard M.D.   On: 10/02/2019 20:03   DG Abd 1 View  Result Date: 10/01/2019 CLINICAL DATA:  Abdomen distension EXAM: ABDOMEN - 1 VIEW COMPARISON:  None. FINDINGS: Air distension of what appears to be cecum in the right  mid abdomen up to 14.8 cm. Remainder of the gas pattern is unremarkable. No radiopaque calculi are visualized. IMPRESSION: Air distension of what appears to be cecum in the right mid abdomen up to 14.8 cm, raising possibility of cecal bascule/cecal obstruction. CT may be considered for further evaluation. These results will be called to the ordering clinician or representative by the Radiologist Assistant, and communication documented in the PACS or [employer]. Electronically Signed   By: Donavan Foil M.D.   On: 10/01/2019 16:25   CT Head Wo Contrast  Result Date: 09/18/2019 CLINICAL DATA:  Fall with left orbital trauma EXAM: CT HEAD AND ORBITS  WITHOUT CONTRAST TECHNIQUE: Contiguous axial images were obtained from the base of the skull through the vertex without contrast. Multidetector CT imaging of the orbits was performed using the standard protocol without intravenous contrast. COMPARISON:  None. FINDINGS: CT HEAD FINDINGS Brain: No evidence of acute infarction, hemorrhage, hydrocephalus, extra-axial collection or mass lesion/mass effect. Vascular: No hyperdense vessel or unexpected calcification. Skull: Normal. Negative for fracture or focal lesion. Other: None. CT ORBITS FINDINGS Orbits: There is heterogeneous density material throughout all chambers of the left lobe. There is suspected globe disruption along the anterior aspect best seen series 10, image 63. The left optic nerve and extraocular muscles are normal. The right orbit is normal. Visualized sinuses: Clear. Soft tissues: Mild left periorbital soft tissue swelling. IMPRESSION: 1. No acute intracranial abnormality. 2. Suspected globe disruption along the anterior aspect of the left globe with intra-ocular hemorrhage. 3. Mild left periorbital soft tissue swelling. Electronically Signed   By: Ulyses Jarred M.D.   On: 09/18/2019 19:46   MR CERVICAL SPINE WO CONTRAST  Result Date: 09/20/2019 CLINICAL DATA:  74 year old male with multiple  falls. Left upper extremity weakness, muscle spasm. Myelopathy. EXAM: MRI CERVICAL SPINE WITHOUT CONTRAST TECHNIQUE: Multiplanar, multisequence MR imaging of the cervical spine was performed. No intravenous contrast was administered. COMPARISON:  Neck MRA 07/02/2019.  CT head and orbits 09/18/2019. FINDINGS: Alignment: Mild straightening of cervical lordosis. Mild degenerative appearing retrolisthesis at from C4 on C5-C6 on C7. Vertebrae: No marrow edema or evidence of acute osseous abnormality. Degenerative endplate marrow signal changes. Cord: Capacious spinal canal at most levels. Cervical and visible upper thoracic spinal cord remain within normal limits. Posterior Fossa, vertebral arteries, paraspinal tissues: Cervicomedullary junction is within normal limits. Negative visible posterior fossa. Preserved major vascular flow voids in the neck. Negative visible neck soft tissues. Disc levels: C2-C3:  Negative. C3-C4: Disc space loss. Mostly foraminal disc bulge and endplate spurring. No spinal stenosis. Moderate bilateral C4 foraminal stenosis. C4-C5: Mild retrolisthesis and disc space loss. Circumferential disc osteophyte complex and mild posterior element hypertrophy. No significant spinal stenosis. Moderate to severe left and moderate right C5 foraminal stenosis. C5-C6: Mild retrolisthesis with disc space loss and circumferential disc osteophyte complex. Mild posterior element hypertrophy. No significant spinal stenosis. Moderate to severe bilateral C6 foraminal stenosis. C6-C7: Mild retrolisthesis. Disc space loss. Mild circumferential disc osteophyte complex. Mild posterior element hypertrophy. No spinal stenosis. Mild to moderate C7 foraminal stenosis greater on the right. C7-T1: Negative disc. Mild to moderate facet hypertrophy. No stenosis. No visible upper thoracic spinal stenosis. There is bilateral upper thoracic facet hypertrophy. 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 But capacious spinal canal with no significant spinal stenosis. Normal cervical spinal cord. 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 Electronically Signed   By: Genevie Ann M.D.   On: 09/20/2019 19:25   NM Hepatobiliary Liver Func  Result Date: 09/30/2019 CLINICAL DATA:  Chronic upper abdominal pain, gallbladder wall thickening on ultrasound, assess gallbladder motility EXAM: NUCLEAR MEDICINE HEPATOBILIARY IMAGING TECHNIQUE: Sequential images of the abdomen were obtained out to 60 minutes following intravenous administration of radiopharmaceutical. Due to nonvisualization of the gallbladder, 3 mg of morphine was administered and imaging was continued for an additional 30 minutes. RADIOPHARMACEUTICALS:  5.3 mCi Tc-31m  Choletec IV COMPARISON:  Ultrasound abdomen 09/27/2019 FINDINGS: Delayed clearance of tracer from bloodstream indicating impaired hepatocellular function. Abnormal appearance of liver with inferior position and discontiguous appearance of the lateral segment  LEFT lobe versus remainder of liver, question hepatic anomaly. Significant retention of tracer within hepatic parenchyma as well as within dilated central biliary tree of the lateral segment LEFT lobe. Delayed visualization of CBD. No definite bowel activity is identified by the conclusion of the exam. Gallbladder is probably visualized just before morphine administration with increased tracer accumulation in probable gallbladder following morphine. 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 Visualization of central biliary radicles, probably gallbladder, and CBD. No small bowel activity is identified to confirm CBD patency; in light of the biliary dilatation identified by ultrasound, recommend assessment by MR imaging with MRCP imaging to evaluate. If patient is unable to undergo MR  imaging, recommend CT assessment, with IV contrast if renal function permits. Electronically Signed   By: Lavonia Dana M.D.   On: 09/30/2019 14:25   US RENAL  Result Date: 09/20/2019 CLINICAL DATA:  Acute renal injury. EXAM: RENAL / URINARY TRACT ULTRASOUND COMPLETE COMPARISON:  None. FINDINGS: Right Kidney: Renal measurements: 10.4 cm x 4.4 cm x 5.1 cm = volume: 122.1 mL. Echogenicity within normal limits. No mass or hydronephrosis visualized. Left Kidney: Renal measurements: 11.4 cm x 6.6 cm x 5.8 cm = volume: 225.9 mL. Echogenicity within normal limits. No mass or hydronephrosis visualized. Bladder: Appears normal for degree of bladder distention. Other: None. IMPRESSION: Normal renal ultrasound. Electronically Signed   By: Virgina Norfolk M.D.   On: 09/20/2019 18:04   MR ABDOMEN MRCP WO CONTRAST  Result Date: 10/04/2019 CLINICAL DATA:  Inpatient. Cirrhosis. Renal failure. Concern for central liver mass on recent noncontrast CT. EXAM: MRI ABDOMEN WITHOUT CONTRAST  (INCLUDING MRCP) TECHNIQUE: Multiplanar multisequence MR imaging of the abdomen was performed. Heavily T2-weighted images of the biliary and pancreatic ducts were obtained, and three-dimensional MRCP images were rendered by post processing. COMPARISON:  10/02/2019 CT abdomen/pelvis. FINDINGS: Scan is limited by motion degradation. Lower chest: No acute abnormality at the lung bases. Hepatobiliary: Diffusely irregular liver contour compatible with cirrhosis. No hepatic steatosis. There is diffuse marked intrahepatic biliary ductal dilatation with abrupt caliber transition at the level of biliary hilum near the central liver/porta hepatis, with normal caliber common bile duct (4 mm diameter). There is vague T2 hyperintensity and restricted diffusion in the central liver at the site of biliary obstruction, spanning approximately 7.5 x 6.8 cm in maximum axial dimensions (series 9/image 103). Several small simple appearing liver cysts, largest 1.5  cm peripherally in the right liver. Mildly distended gallbladder with mild diffuse gallbladder wall thickening. No cholelithiasis. No evidence of choledocholithiasis. Pancreas: No pancreatic mass or duct dilation.  No pancreas divisum. Spleen: Normal size. No mass. Adrenals/Urinary Tract: Normal adrenals. No hydronephrosis. Normal kidneys with no renal mass. Stomach/Bowel: Normal non-distended stomach. Visualized small and large bowel is normal caliber, with no bowel wall thickening. Vascular/Lymphatic: Normal caliber abdominal aorta. No pathologically enlarged lymph nodes in the abdomen. Other: Small to moderate volume ascites, predominantly perihepatic. No focal fluid collection. Musculoskeletal: No aggressive appearing focal osseous lesions. IMPRESSION: 1. Limited motion degraded noncontrast MRI. 2. Diffuse marked intrahepatic biliary ductal dilatation with abrupt caliber transition at the level of the biliary hilum near the central liver/porta hepatis. Vague T2 hyperintensity and restricted diffusion in the central liver at the site of biliary obstruction, spanning approximately 7.5 x 6.8 cm in maximum axial dimensions. 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 GI consultation and consideration of ERCP suggested. 3. Morphologic changes of cirrhosis. Small to moderate volume ascites. 4. Mild  diffuse gallbladder wall thickening, nonspecific, likely due to noninflammatory edema. No cholelithiasis. No evidence of choledocholithiasis. Electronically Signed   By: Ilona Sorrel M.D.   On: 10/04/2019 07:47   CT Orbits Wo Contrast  Result Date: 09/18/2019 CLINICAL DATA:  Fall with left orbital trauma EXAM: CT HEAD AND ORBITS WITHOUT CONTRAST TECHNIQUE: Contiguous axial images were obtained from the base of the skull through the vertex without contrast. Multidetector CT imaging of the orbits was performed using the standard protocol without intravenous contrast. COMPARISON:  None. FINDINGS: CT HEAD  FINDINGS Brain: No evidence of acute infarction, hemorrhage, hydrocephalus, extra-axial collection or mass lesion/mass effect. Vascular: No hyperdense vessel or unexpected calcification. Skull: Normal. Negative for fracture or focal lesion. Other: None. CT ORBITS FINDINGS Orbits: There is heterogeneous density material throughout all chambers of the left lobe. There is suspected globe disruption along the anterior aspect best seen series 10, image 63. The left optic nerve and extraocular muscles are normal. The right orbit is normal. Visualized sinuses: Clear. Soft tissues: Mild left periorbital soft tissue swelling. IMPRESSION: 1. No acute intracranial abnormality. 2. Suspected globe disruption along the anterior aspect of the left globe with intra-ocular hemorrhage. 3. Mild left periorbital soft tissue swelling. Electronically Signed   By: Ulyses Jarred M.D.   On: 09/18/2019 19:46   US Abdomen Limited RUQ  Result Date: 09/27/2019 CLINICAL DATA:  Elevated liver enzymes. EXAM: ULTRASOUND ABDOMEN LIMITED RIGHT UPPER QUADRANT COMPARISON:  Abdominal ultrasound 12/18/2014 FINDINGS: Gallbladder: Distended with edematous gallbladder wall thickening measuring up to 6 mm. No shadowing gallstone. No intraluminal sludge. No sonographic Murphy sign noted by sonographer. Common bile duct: Diameter: 7 mm, upper normal for age. Liver: There is intrahepatic biliary ductal dilatation. Hepatic parenchyma is heterogeneous, and mildly increased. There is capsular nodularity. Liver lesions on prior ultrasound are not well seen on the current exam. Portal vein is patent on color Doppler imaging with normal direction of blood flow towards the liver. Other: Small volume right upper quadrant ascites and pericholecystic fluid. IMPRESSION: 1. Distended gallbladder with edematous gallbladder wall thickening. No gallstones or sludge. Findings may be related to underlying chronic liver disease with ascites in the right upper quadrant. If  there is clinical concern for acute cholecystitis, recommend further evaluation with nuclear medicine HIDA scan. 2. Intrahepatic biliary ductal dilatation with upper normal common bile duct. 3. Heterogeneous hepatic parenchyma with increased echogenicity and suggestion of capsular nodularity. Findings suspicious for cirrhosis. Liver lesions on 2016 ultrasound are not well seen on the current exam. Electronically Signed   By: Keith Rake M.D.   On: 09/27/2019 21:42   IR Paracentesis  Result Date: 10/03/2019 INDICATION: Abdominal distention. Newly found ascites. Request diagnostic and therapeutic paracentesis. EXAM: ULTRASOUND GUIDED RIGHT LOWER QUADRANT PARACENTESIS MEDICATIONS: 1% plain lidocaine, 10 mL COMPLICATIONS: None immediate. PROCEDURE: Informed written consent was obtained from the patient after a discussion of the risks, benefits and alternatives to treatment. A timeout was performed prior to the initiation of the procedure. Initial ultrasound scanning demonstrates a small to moderate amount of ascites within the right lower abdominal quadrant. There is additional perihepatic and perisplenic ascites high in the abdomen. The right lower abdomen was prepped and draped in the usual sterile fashion. 1% lidocaine was used for local anesthesia. Following this, a 19 gauge, 7-cm, Yueh catheter was introduced. An ultrasound image was saved for documentation purposes. The paracentesis was performed. The catheter was removed and a dressing was applied. The patient tolerated the procedure well without immediate post  procedural complication. FINDINGS: A total of approximately 1.3 L of clear yellow fluid was removed. Samples were sent to the laboratory as requested by the clinical team. IMPRESSION: Successful ultrasound-guided paracentesis yielding 1.3 liters of peritoneal fluid. Read by: Ascencion Dike PA-C Electronically Signed   By: Corrie Mckusick D.O.   On: 10/03/2019 14:16    Labs:  CBC: Recent Labs     10/02/19 0753 10/03/19 2002 10/04/19 1118 10/05/19 0651  WBC 10.6* 10.2 12.8* 9.2  HGB 13.5 13.7 15.1 13.0  HCT 39.4 39.8 43.3 37.8*  PLT 64* 61* 57* 47*    COAGS: Recent Labs    09/18/19 1811 10/02/19 2104  INR 1.3* 1.5*    BMP: Recent Labs    10/02/19 2104 10/03/19 2002 10/04/19 1118 10/05/19 0651  NA 141 140 142 142  K 4.9 3.7 3.2* 3.1*  CL 106 96* 97* 94*  CO2 25 32 29 34*  GLUCOSE 116* 138* 142* 116*  BUN 79* 79* 79* 73*  CALCIUM 8.2* 8.1* 8.0* 7.7*  CREATININE 2.53* 2.85* 2.71* 2.74*  GFRNONAA 24* 21* 22* 22*  GFRAA 28* 24* 26* 25*    LIVER FUNCTION TESTS: Recent Labs    09/30/19 0429 09/30/19 0429 10/01/19 0452 10/02/19 2104 10/04/19 1118 10/05/19 0651  BILITOT 5.2*  --  5.1* 5.7*  --  5.7*  AST 109*  --  106* 122*  --  118*  ALT 96*  --  97* 108*  --  96*  ALKPHOS 140*  --  137* 127*  --  105  PROT 6.6  --  6.4* 6.0*  --  5.5*  ALBUMIN 1.8*   < > 1.7* 1.7* 1.7* 1.6*   < > = values in this interval not displayed.    TUMOR MARKERS: No results for input(s): AFPTM, CEA, CA199, CHROMGRNA in the last 8760 hours.  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  Patient history and imaging have been reviewed by Dr. Earleen Newport today who agrees to procedure - will plan for tomorrow in IR pending any emergent procedures. I discussed with patient and his wife that if we are unable to proceed tomorrow we will plan for Monday and they are in agreement with this plan.  Patient to be NPO at midnight, hold anticoagulation until post procedure, AM labs ordered, IR will call when ready.  Risks and benefits were discussed with the patient and his wife Vicente Males including, but not limited to, bleeding, infection, gallbladder perforation, bile leak, sepsis or even  death.  All of the patient's wife's questions were answered, patient's wife is agreeable to proceed.  Consent signed and in IR control room.  Thank you for this interesting consult.  I greatly enjoyed meeting Galan Ghee and look forward to participating in their care.  A copy of this report was sent to the requesting provider on this date.  Electronically Signed: Joaquim Nam, PA-C 10/05/2019, 2:21 PM   I spent a total of 40 Minutes in face to face in clinical consultation, greater than 50% of which was counseling/coordinating care for internal/external biliary drain placement + biopsy.

## 2019-10-05 NOTE — Plan of Care (Signed)
°  Problem: RH Problem Solving Goal: LTG Patient will demonstrate problem solving for (SLP) Description: LTG:  Patient will demonstrate problem solving for basic/complex daily situations with cues  (SLP) Flowsheets (Taken 10/05/2019 1625) LTG: Patient will demonstrate problem solving for (SLP): Basic daily situations LTG Patient will demonstrate problem solving for: Supervision   Problem: RH Memory Goal: LTG Patient will use memory compensatory aids to (SLP) Description: LTG:  Patient will use memory compensatory aids to recall biographical/new, daily complex information with cues (SLP) Flowsheets (Taken 10/05/2019 1625) LTG: Patient will use memory compensatory aids to (SLP): Supervision   Problem: RH Attention Goal: LTG Patient will demonstrate this level of attention during functional activites (SLP) Description: LTG:  Patient will will demonstrate this level of attention during functional activites (SLP) Flowsheets (Taken 10/05/2019 1625) Patient will demonstrate during cognitive/linguistic activities the attention type of: Sustained Patient will demonstrate this level of attention during cognitive/linguistic activities in: Controlled LTG: Patient will demonstrate this level of attention during cognitive/linguistic activities with assistance of (SLP): Moderate Assistance - Patient 50 - 74% Number of minutes patient will demonstrate attention during cognitive/linguistic activities: 5 minutes   Problem: RH Awareness Goal: LTG: Patient will demonstrate awareness during functional activites type of (SLP) Description: LTG: Patient will demonstrate awareness during functional activites type of (SLP) Flowsheets (Taken 10/05/2019 1625) Patient will demonstrate during cognitive/linguistic activities awareness type of: Intellectual LTG: Patient will demonstrate awareness during cognitive/linguistic activities with assistance of (SLP): Supervision

## 2019-10-05 NOTE — Progress Notes (Signed)
Physical Therapy Session Note  Patient Details  Name: Nicholas Ramirez MRN: [id number] Date of Birth: Jun 26, 1945  Today's Date: 10/05/2019 PT Individual Time: 1445-1500 PT Individual Time Calculation (min): 15 min   Short Term Goals: Week 1:  PT Short Term Goal 1 (Week 1): Patient will remain sitting with R UE support for >2 mins with no more than MinA x1 PT Short Term Goal 2 (Week 1): Patient will transfer bed<> wc with no more than ModA x2 PT Short Term Goal 3 (Week 1): Patient will be able to complete bed mobility with MaxA x1  Skilled Therapeutic Interventions/Progress Updates:  Pt was seen bedside in the pm. Pt was NPO with procedure ERCP scheduled for this am. Procedure was cancelled. Initially as therapist attempted to initiate treatment pt's lunch was deliver.  Allowed pt's time to eat lunch. Returned to room and resume treatment. Pt was arousal but drifted in and out. Pt minimally participatory with exercise. Performed 3 sets x 10 reps each for heel slides, hip abd/add and SAQs. Pt requiring multiple cues to remain engaged with treatment. Treatment ended due to fatigue.   Therapy Documentation Precautions:  Precautions Precautions: Fall, Other (comment) (L eye shield all times) Precaution Comments: pt admitted for a fall, daughter reports 15-20 in last 6 months Restrictions Weight Bearing Restrictions: No General: PT Amount of Missed Time (min): 60 Minutes PT Missed Treatment Reason: Patient fatigue Pain: No c/o pain.   Therapy/Group: Individual Therapy  Dub Amis 10/05/2019, 3:05 PM

## 2019-10-05 NOTE — Progress Notes (Signed)
Anesthesia understandably has questions about the safety of him laying on his stomach, general anesthesia with his face (especially left eye) will be face down on a pillow.  I tried to get in touch with his optho team however I waited on hold with the anwering service for 9 minutes and then gave up.  We are going to reach out to any optho that may be on call for [facility] currently and ask them the above question.  ERCP is not emergent and PTC is an alternative if needed.

## 2019-10-05 NOTE — Progress Notes (Signed)
Patient ID: Nicholas Ramirez, male   DOB: Mar 22, 1945, 74 y.o.   MRN: [id number] [facility] Progress Note   Assessment/ Plan:   1. Acute kidney Injury: Decent urine output of 3 L overnight after discontinuation of diuretic.  Suspect that he might be in the recovery phase of ATN with auto diuresis prompting discontinuation of torsemide yesterday.  Hypokalemia noted from renal wasting along with metabolic alkalosis.  Will replace potassium. 2.  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  Plans by gastroenterology noted for ERCP attempt for biliary decompression and stricture sampling today. 3.  Hypertension: Blood pressures mildly elevated on monotherapy with carvedilol; continue to monitor with ongoing auto diuresis/renal recovery. 4.  Ruptured globe left eye: Secondary to mechanical fall/trauma and status post surgical repair.  On antibiotics-levofloxacin and doxycycline. 5.  Recurrent falls/recent diagnosis of Parkinson disease.  Admitted to CIR for ongoing rehabilitation efforts. 6.  Anemia: Possibly associated with acute illness versus newly uncovered malignancy.  Iron stores adequate (iron saturation 69% with ferritin 768).  Subjective:   Denies any acute events overnight, plans in place today for ERCP.   Objective:   BP (!) 135/94   Pulse 85   Temp 98.3 F (36.8 C) (Oral)   Resp 16   Wt 95.7 kg   SpO2 95%   BMI 31.16 kg/m   Intake/Output Summary (Last 24 hours) at 10/05/2019 0909 Last data filed at 10/05/2019 4098 Gross per 24 hour  Intake 840 ml  Output 3050 ml  Net -2210 ml   Weight change: -1.361 kg  Physical Exam: Gen: Comfortably resting  flat in bed, wife at bedside. CVS: Pulse regular rhythm, normal rate, S1 and S2 normal Resp: Poor inspiratory effort with decreased breath sounds over bases, no distinct rales Abd: Soft, obese, nontender Ext: 2+ lower extremity edema, lower extremities in wraps.  Imaging: MR ABDOMEN MRCP WO CONTRAST  Result Date: 10/04/2019 CLINICAL DATA:  Inpatient. Cirrhosis. Renal failure. Concern for central liver mass on recent noncontrast CT. EXAM: MRI ABDOMEN WITHOUT CONTRAST  (INCLUDING MRCP) TECHNIQUE: Multiplanar multisequence MR imaging of the abdomen was performed. Heavily T2-weighted images of the biliary and pancreatic ducts were obtained, and three-dimensional MRCP images were rendered by post processing. COMPARISON:  10/02/2019 CT abdomen/pelvis. FINDINGS: Scan is limited by motion degradation. Lower chest: No acute abnormality at the lung bases. Hepatobiliary: Diffusely irregular liver contour compatible with cirrhosis. No hepatic steatosis. There is diffuse marked intrahepatic biliary ductal dilatation with abrupt caliber transition at the level of biliary hilum near the central liver/porta hepatis, with normal caliber common bile duct (4 mm diameter). There is vague T2 hyperintensity and restricted diffusion in the central liver at the site of biliary obstruction, spanning approximately 7.5 x 6.8 cm in maximum axial dimensions (series 9/image 103). Several small simple appearing liver cysts, largest 1.5 cm peripherally in the right liver. Mildly distended gallbladder with mild diffuse gallbladder wall thickening. No cholelithiasis. No evidence of choledocholithiasis. Pancreas: No pancreatic mass or duct dilation.  No pancreas divisum. Spleen: Normal size. No mass. Adrenals/Urinary Tract: Normal adrenals. No hydronephrosis. Normal kidneys with no renal mass. Stomach/Bowel: Normal non-distended stomach. Visualized small and large bowel is normal caliber, with no bowel wall thickening. Vascular/Lymphatic:  Normal caliber abdominal aorta. No pathologically  enlarged lymph nodes in the abdomen. Other: Small to moderate volume ascites, predominantly perihepatic. No focal fluid collection. Musculoskeletal: No aggressive appearing focal osseous lesions. IMPRESSION: 1. Limited motion degraded noncontrast MRI. 2. Diffuse marked intrahepatic biliary ductal dilatation with abrupt caliber transition at the level of the biliary hilum near the central liver/porta hepatis. Vague T2 hyperintensity and restricted diffusion in the central liver at the site of biliary obstruction, spanning approximately 7.5 x 6.8 cm in maximum axial dimensions. 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 GI consultation and consideration of ERCP suggested. 3. Morphologic changes of cirrhosis. Small to moderate volume ascites. 4. Mild diffuse gallbladder wall thickening, nonspecific, likely due to noninflammatory edema. No cholelithiasis. No evidence of choledocholithiasis. Electronically Signed   By: Ilona Sorrel M.D.   On: 10/04/2019 07:47   IR Paracentesis  Result Date: 10/03/2019 INDICATION: Abdominal distention. Newly found ascites. Request diagnostic and therapeutic paracentesis. EXAM: ULTRASOUND GUIDED RIGHT LOWER QUADRANT PARACENTESIS MEDICATIONS: 1% plain lidocaine, 10 mL COMPLICATIONS: None immediate. PROCEDURE: Informed written consent was obtained from the patient after a discussion of the risks, benefits and alternatives to treatment. A timeout was performed prior to the initiation of the procedure. Initial ultrasound scanning demonstrates a small to moderate amount of ascites within the right lower abdominal quadrant. There is additional perihepatic and perisplenic ascites high in the abdomen. The right lower abdomen was prepped and draped in the usual sterile fashion. 1% lidocaine was used for local anesthesia. Following this, a 19 gauge, 7-cm, Yueh catheter was introduced. An ultrasound image was saved for  documentation purposes. The paracentesis was performed. The catheter was removed and a dressing was applied. The patient tolerated the procedure well without immediate post procedural complication. FINDINGS: A total of approximately 1.3 L of clear yellow fluid was removed. Samples were sent to the laboratory as requested by the clinical team. IMPRESSION: Successful ultrasound-guided paracentesis yielding 1.3 liters of peritoneal fluid. Read by: Ascencion Dike PA-C Electronically Signed   By: Corrie Mckusick D.O.   On: 10/03/2019 14:16    Labs: BMET Recent Labs  Lab 09/29/19 1001 09/30/19 0429 10/01/19 0452 10/02/19 2104 10/03/19 2002 10/04/19 1118 10/05/19 0651  NA 143 141 144 141 140 142 142  K 5.1 5.1 5.0 4.9 3.7 3.2* 3.1*  CL 110 108 108 106 96* 97* 94*  CO2 24 24 26 25  32 29 34*  GLUCOSE 128* 118* 132* 116* 138* 142* 116*  BUN 78* 82* 86* 79* 79* 79* 73*  CREATININE 2.95* 2.91* 2.98* 2.53* 2.85* 2.71* 2.74*  CALCIUM 8.4* 8.4* 8.5* 8.2* 8.1* 8.0* 7.7*  PHOS 5.3*  --   --   --   --  5.0* 4.8*   CBC Recent Labs  Lab 10/02/19 0753 10/03/19 2002 10/04/19 1118 10/05/19 0651  WBC 10.6* 10.2 12.8* 9.2  NEUTROABS 8.6* 7.9* 10.0* 7.0  HGB 13.5 13.7 15.1 13.0  HCT 39.4 39.8 43.3 37.8*  MCV 101.3* 100.8* 100.2* 101.1*  PLT 64* 61* 57* 47*    Medications:    . carvedilol  3.125 mg Oral BID WC  . Chlorhexidine Gluconate Cloth  6 each Topical BID  . DULoxetine  60 mg Oral Daily  . finasteride  5 mg Oral Daily  . gatifloxacin  1 drop Left Eye QID  . loratadine  10 mg Oral Daily  . mirabegron ER  50 mg Oral Daily  . neomycin-polymyxin b-dexamethasone  1 application Left Eye QID  . nystatin  5 mL Oral QID  . prednisoLONE acetate  1 drop Left Eye QID  . senna-docusate  1 tablet Oral BID   Elmarie Shiley, MD 10/05/2019, 9:09 AM

## 2019-10-05 NOTE — Progress Notes (Signed)
Occupational Therapy Session Note  Patient Details  Name: Nicholas Ramirez MRN: [id number] Date of Birth: Jul 15, 1945  Today's Date: 10/05/2019 OT Individual Time: 1655-3748 OT Individual Time Calculation (min): 57 min   Short Term Goals: Week 1:  OT Short Term Goal 1 (Week 1): Pt will transition sit to stand with most appropriate AD with Max of 1 to assist with LB ADLs OT Short Term Goal 2 (Week 1): Pt will perform static stands for 1-3 minutes with no more than Mod Ax1  for upright standing OT Short Term Goal 3 (Week 1): Pt will need no more than Min verbal cues to decrease Left lateral lean and orient to midline OT Short Term Goal 4 (Week 1): Pt will perform UB dress with Mod A      Skilled Therapeutic Interventions/Progress Updates:    Pt greeted in bed, appearing alert with his Rt eye opened. Pt reported that he had HA pain and was agreeable to receive pain medicine from RN. Also agreeable for lavender aromatherapy to be applied to his pillowcase to address HA. Started with perihygiene bedlevel, worked on pt initiating assistance with bed mobility, bending knee and reaching for bedrail as appropriate. His spouse, Vicente Males, was present, assisting OT in regards to helping pt maintain sidelying during LB hygiene/dressing tasks. Worked on pt elevating each LE against gravity during washing/dressing lower legs, Rt LE with more strength than the Lt LE in this regard, pt visibly trying his best to follow instruction. With [facility] elevated, pt required [facility] assistance to wash face as he'd try to wash the Lt eye (covered with bandage). Mod HOH for washing chest and abdomen using the Rt hand. Pt able to minimally flex the Lt shoulder against gravity to assist with UB tasks, noted increased tremulous activity during active movement. He was able to reach for ADL items ~6 inches in front of face and also place his Rt hand inside of sleeve of hospital gown. At end of session pt was repositioned comfortably with pillows  for edema mgt, left with soft call bell, bed alarm set, and wife at bedside. Tx focus placed on following 1 step instruction, ADL retraining, and maintaining alertness during functional activity.   Therapy Documentation Precautions:  Precautions Precautions: Fall, Other (comment) (L eye shield all times) Precaution Comments: pt admitted for a fall, daughter reports 15-20 in last 6 months Restrictions Weight Bearing Restrictions: No Pain: RN in during session to provide pain medicine    ADL: ADL Grooming: Moderate assistance Where Assessed-Grooming: Sitting at sink Upper Body Bathing: Maximal assistance Where Assessed-Upper Body Bathing: Sitting at sink Lower Body Bathing: Dependent Upper Body Dressing: Maximal assistance Lower Body Dressing: Dependent Toileting: Dependent ADL Comments: Max/total of 2 for sit to stands and all transfers      Therapy/Group: Individual Therapy  Bonner Larue A Giannah Zavadil 10/05/2019, 12:29 PM

## 2019-10-05 NOTE — Progress Notes (Signed)
Speech Language Pathology Daily Session Note  Patient Details  Name: Echo Allsbrook MRN: [id number] Date of Birth: 11-14-1945  Today's Date: 10/05/2019 SLP Individual Time: 0910-0949 SLP Individual Time Calculation (min): 39 min  Short Term Goals: Week 1: SLP Short Term Goal 1 (Week 1): Patient will consume meal trays of Dys 2 (fine chop), thin liquids without overt s/s of aspiration or penetration and without significant oral delay with minA. SLP Short Term Goal 2 (Week 1): Patient will tolerate trials of upgraded solids with adequately efficient mastication and with no more than trace oral residuals post initial swallow. SLP Short Term Goal 3 (Week 1): Pt will demonstrate intellectual awareness idenitfying 2 cognitive and 2 phsyical deficits with mod A verbal cues. SLP Short Term Goal 4 (Week 1): Pt will demonstrate alertness and sustained attention in 1 minute intervals with mod multimodal cues. SLP Short Term Goal 5 (Week 1): Pt will demonstrate basic problem solving in functional tasks with max A multimodal cues. SLP Short Term Goal 6 (Week 1): Pt will demonstrate recall of daily and novel events with max A multimodal cues for external aids.  Skilled Therapeutic Interventions:Skilled ST services focused on cognitive skills. Pt's wife was present for treatment session. Pt was lethargic but alert and willing to participate in formal cognitive assessment. SLP administered SLUMs pt scored 4 out 30 (n=>27) with primary impairment of reduced alertness/sustained attention impacting, immediate/delayed recall, basic problem solving and awareness. Pt demonstrated ability to sustain attention in 30 seconds to 1 minute intervals with max A multimodal cues before closing eyes and appearing to drift to sleep. Pt was able to recall 1 out 5 words with a delay and immediate recall of 3 out 5 words. Pt demonstrated recall of call bell function when questioned, however inconsistent ability to locate and activate  call bell. Pt demonstrated some awareness of lethargy and reduced attention. SLP educated pt and pt's wife pertaining to cognitive deficits and goals for CIR. All questions answered to satisfaction. SLP added LTG and STG cognitive goals. Pt was left in room with wife, call bell within reach and chair alarm set. SLP recommends to continue skilled services.     Pain Pain Assessment Pain Scale: 0-10 Pain Score: 0-No pain Pain Type: Acute pain Pain Location: Head Pain Orientation: Left Pain Descriptors / Indicators: Aching Pain Frequency: Intermittent Pain Onset: On-going Patients Stated Pain Goal: 2 Pain Intervention(s): Medication (See eMAR)  Therapy/Group: Individual Therapy  Ansel Ferrall  [facility] 10/05/2019, 4:31 PM

## 2019-10-05 NOTE — Progress Notes (Signed)
[place] PHYSICAL MEDICINE & REHABILITATION PROGRESS NOTE   Subjective/Complaints: No complaints this morning Feels therapy is going well. Mouth is dry- agitated from this. NPO for procedure  ROS: Denies CP, SOB, N/V/D  Objective:    MR ABDOMEN MRCP WO CONTRAST  Result Date: 10/04/2019 CLINICAL DATA:  Inpatient. Cirrhosis. Renal failure. Concern for central liver mass on recent noncontrast CT. EXAM: MRI ABDOMEN WITHOUT CONTRAST  (INCLUDING MRCP) TECHNIQUE: Multiplanar multisequence MR imaging of the abdomen was performed. Heavily T2-weighted images of the biliary and pancreatic ducts were obtained, and three-dimensional MRCP images were rendered by post processing. COMPARISON:  10/02/2019 CT abdomen/pelvis. FINDINGS: Scan is limited by motion degradation. Lower chest: No acute abnormality at the lung bases. Hepatobiliary: Diffusely irregular liver contour compatible with cirrhosis. No hepatic steatosis. There is diffuse marked intrahepatic biliary ductal dilatation with abrupt caliber transition at the level of biliary hilum near the central liver/porta hepatis, with normal caliber common bile duct (4 mm diameter). There is vague T2 hyperintensity and restricted diffusion in the central liver at the site of biliary obstruction, spanning approximately 7.5 x 6.8 cm in maximum axial dimensions (series 9/image 103). Several small simple appearing liver cysts, largest 1.5 cm peripherally in the right liver. Mildly distended gallbladder with mild diffuse gallbladder wall thickening. No cholelithiasis. No evidence of choledocholithiasis. Pancreas: No pancreatic mass or duct dilation.  No pancreas divisum. Spleen: Normal size. No mass. Adrenals/Urinary Tract: Normal adrenals. No hydronephrosis. Normal kidneys with no renal mass. Stomach/Bowel: Normal non-distended stomach. Visualized small and large bowel is normal caliber, with no bowel wall thickening. Vascular/Lymphatic: Normal caliber abdominal aorta.  No pathologically enlarged lymph nodes in the abdomen. Other: Small to moderate volume ascites, predominantly perihepatic. No focal fluid collection. Musculoskeletal: No aggressive appearing focal osseous lesions. IMPRESSION: 1. Limited motion degraded noncontrast MRI. 2. Diffuse marked intrahepatic biliary ductal dilatation with abrupt caliber transition at the level of the biliary hilum near the central liver/porta hepatis. Vague T2 hyperintensity and restricted diffusion in the central liver at the site of biliary obstruction, spanning approximately 7.5 x 6.8 cm in maximum axial dimensions. 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 GI consultation and consideration of ERCP suggested. 3. Morphologic changes of cirrhosis. Small to moderate volume ascites. 4. Mild diffuse gallbladder wall thickening, nonspecific, likely due to noninflammatory edema. No cholelithiasis. No evidence of choledocholithiasis. Electronically Signed   By: Ilona Sorrel M.D.   On: 10/04/2019 07:47   IR Paracentesis  Result Date: 10/03/2019 INDICATION: Abdominal distention. Newly found ascites. Request diagnostic and therapeutic paracentesis. EXAM: ULTRASOUND GUIDED RIGHT LOWER QUADRANT PARACENTESIS MEDICATIONS: 1% plain lidocaine, 10 mL COMPLICATIONS: None immediate. PROCEDURE: Informed written consent was obtained from the patient after a discussion of the risks, benefits and alternatives to treatment. A timeout was performed prior to the initiation of the procedure. Initial ultrasound scanning demonstrates a small to moderate amount of ascites within the right lower abdominal quadrant. There is additional perihepatic and perisplenic ascites high in the abdomen. The right lower abdomen was prepped and draped in the usual sterile fashion. 1% lidocaine was used for local anesthesia. Following this, a 19 gauge, 7-cm, Yueh catheter was introduced. An ultrasound image was saved for documentation purposes. The paracentesis  was performed. The catheter was removed and a dressing was applied. The patient tolerated the procedure well without immediate post procedural complication. FINDINGS: A total of approximately 1.3 L of clear yellow fluid was removed. Samples were sent to the laboratory as requested by the clinical team. IMPRESSION: Successful  ultrasound-guided paracentesis yielding 1.3 liters of peritoneal fluid. Read by: Ascencion Dike PA-C Electronically Signed   By: Corrie Mckusick D.O.   On: 10/03/2019 14:16   Recent Labs    10/04/19 1118 10/05/19 0651  WBC 12.8* 9.2  HGB 15.1 13.0  HCT 43.3 37.8*  PLT 57* 47*   Recent Labs    10/04/19 1118 10/05/19 0651  NA 142 142  K 3.2* 3.1*  CL 97* 94*  CO2 29 34*  GLUCOSE 142* 116*  BUN 79* 73*  CREATININE 2.71* 2.74*  CALCIUM 8.0* 7.7*    Intake/Output Summary (Last 24 hours) at 10/05/2019 1049 Last data filed at 10/05/2019 0723 Gross per 24 hour  Intake 840 ml  Output 3050 ml  Net -2210 ml        Physical Exam: Vital Signs Blood pressure (!) 135/94, pulse 85, temperature 98.3 F (36.8 C), temperature source Oral, resp. rate 16, weight 95.7 kg, SpO2 95 %. General: Alert and oriented x 3, No apparent distress HEENT: Left eye bandaged, mouth dry Neck: Supple without JVD or lymphadenopathy Heart: Reg rate and rhythm. + Murmur. Respiratory: Normal effort.  No stridor.  Bilateral clear to auscultation. GI: Non-distended.  BS +. Skin: Vascular changes bilateral lower extremities Psych: Flat. Musc: Generalized edema. Left periorbital edema Neuro: Alert Dysarthria, unchanged Motor: RUE/RLE: 3+/5 proximal distal LUE: Limited due to significant tone/rigidity, appears to be 4/5 within available range of motion, unchanged LLE: Hip flexion, knee extension 1/5, ankle dorsiflexion 3/5  Assessment/Plan: 1. Functional deficits secondary to Parkinson's, with recent anterior left globe rupture which require 3+ hours per day of interdisciplinary therapy in a  comprehensive inpatient rehab setting.  Physiatrist is providing close team supervision and 24 hour management of active medical problems listed below.  Physiatrist and rehab team continue to assess barriers to discharge/monitor patient progress toward functional and medical goals  Care Tool:  Bathing    Body parts bathed by patient: Right arm, Left arm, Chest, Abdomen, Face   Body parts bathed by helper: Right arm, Left arm, Chest, Abdomen, Front perineal area, Buttocks, Right upper leg, Left upper leg, Right lower leg, Left lower leg, Face     Bathing assist Assist Level: 2 Helpers     Upper Body Dressing/Undressing Upper body dressing   What is the patient wearing?: Hospital gown only    Upper body assist Assist Level: Maximal Assistance - Patient 25 - 49%    Lower Body Dressing/Undressing Lower body dressing      What is the patient wearing?: Incontinence brief     Lower body assist Assist for lower body dressing: 2 Helpers     Toileting Toileting Toileting Activity did not occur (Clothing management and hygiene only):  (not performed at time of eval, has Foley for urine and no need for BM at time of eval)  Toileting assist Assist for toileting: Dependent - Patient 0%     Transfers Chair/bed transfer  Transfers assist     Chair/bed transfer assist level: 2 Helpers     Locomotion Ambulation   Ambulation assist   Ambulation activity did not occur: Safety/medical concerns (fatigue/weakness)          Walk 10 feet activity   Assist  Walk 10 feet activity did not occur: Safety/medical concerns (fatigue/weakness)        Walk 50 feet activity   Assist Walk 50 feet with 2 turns activity did not occur: Safety/medical concerns (fatigue/weakness)         Walk  150 feet activity   Assist Walk 150 feet activity did not occur: Safety/medical concerns (fatigue/weakness)         Walk 10 feet on uneven surface  activity   Assist Walk 10  feet on uneven surfaces activity did not occur: Safety/medical concerns (fatigue/weakness)         Wheelchair     Assist Will patient use wheelchair at discharge?: Yes Type of Wheelchair: Manual    Wheelchair assist level: Dependent - Patient 0% (tilt in space wc does not allow for patient to propel himself)      Wheelchair 50 feet with 2 turns activity    Assist        Assist Level: Dependent - Patient 0% (tilt in space wc does not allow for patient to propel himself)   Wheelchair 150 feet activity     Assist      Assist Level: Maximal Assistance - Patient 25 - 49%   Medical Problem List and Plan: 1.Functional deficitssecondary to Parkinson's Gait disorder. Recent fall with rupture of anterior left globe.  Continue CIR 2. Antithrombotics: -DVT/anticoagulation:Mechanical:Antiembolism stockings, knee (TED hose) Bilateral lower extremities Sequential compression devices, below kneeBilateral lower extremities -antiplatelet therapy: n/a 3.Abdominal pain/Pain Management:Continue oxycodoneprn  Appears to be controlled with meds on 10/1  10/2: pain in his left eye. Medication does help  Monitor with increased exertion 4. Mood:team to provide ego support -antipsychotic agents: n/a 5. Neuropsych: This patient is not capable of making decisions on his own behalf. 6. Skin/Wound Care:local care as indicated 7. Fluids/Electrolytes/Nutrition:encourage appropriate po 8. Ruptured globe left eye s/p surgical repair 9/16 by ophthalmology Dr. Eulas Post -eye shield at all times -broad spectrum coverage transitioned tolevofloxacin and doxycycline, completed on 9/30 -continueeye gttsfor 2 additional weeks(~10/12) -outpt f/u with Dr. Eulas Post after discharge 9. HTN: -pt on lisinopril, amlodipine, propranolol at home -Coreg started on 9/27   10/2: slightly elevated:  continue to monitor.   Moderate increased mobility  10. EUM:[id number], likely related toATN v/svancomycin Mirabegron ER and finasteride resumed  Creatinine 2.71 on 10/1  Continue to monitor 11.Parkinson's Disease Sinemet added recently but not started per notes. Pt followed by Dr. Tomi Likens  Will consider initiation after completion of GI work-up 12. Hyponatremia, mild SIADH: Resolved 13. Prolonged QT interval: Avoid rate prolongation meds  ECG reviewed, showing persistent prolonged QTC 14. Constipation Senna-s two tabs at HS  Adjust bowel meds as necessary  Not moving bowels since NPO 15.Distended abdomen HIDA scan negative.Some chronic findings due to abnl small bowel motility   LFTs remain elevated on 9/29  See #14 16.  Hyperkalemia  Potassium 3.2 on 10/1, labs ordered for tomorrow  Lokelma DC'd  Continue to monitor 17.  Hypoalbuminemia  Supplement initiated on 9/28 18.  Spasticity versus rigidity  See #11  Previously, thought was to consider neck MRI, however given recent findings this will not be indicated at this time 19.  Dysphagia  Continue D2 thins resumed on 9/30 20.  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  Thrombocytopenia  Platelets 61 on 9/30, 47 on 10/2  LOS: 5 days A FACE TO FACE EVALUATION WAS PERFORMED  Clide Deutscher Erskin Zinda 10/05/2019, 10:49 AM

## 2019-10-06 ENCOUNTER — Inpatient Hospital Stay (HOSPITAL_COMMUNITY): Payer: Medicare Other

## 2019-10-06 HISTORY — PX: IR INT EXT BILIARY DRAIN WITH CHOLANGIOGRAM: IMG6044

## 2019-10-06 HISTORY — PX: IR PERCUTANEOUS TRANSHEPATIC CHOLANGIOGRAM: IMG6042

## 2019-10-06 HISTORY — PX: IR BILIARY DRAIN PLACEMENT WITH CHOLANGIOGRAM: IMG6043

## 2019-10-06 LAB — PROTIME-INR
INR: 1.4 — ABNORMAL HIGH (ref 0.8–1.2)
Prothrombin Time: 16.9 seconds — ABNORMAL HIGH (ref 11.4–15.2)

## 2019-10-06 LAB — CBC WITH DIFFERENTIAL/PLATELET
Abs Immature Granulocytes: 0.1 10*3/uL — ABNORMAL HIGH (ref 0.00–0.07)
Basophils Absolute: 0 10*3/uL (ref 0.0–0.1)
Basophils Relative: 0 %
Eosinophils Absolute: 0.5 10*3/uL (ref 0.0–0.5)
Eosinophils Relative: 4 %
HCT: 40.2 % (ref 39.0–52.0)
Hemoglobin: 13.8 g/dL (ref 13.0–17.0)
Immature Granulocytes: 1 %
Lymphocytes Relative: 15 %
Lymphs Abs: 1.8 10*3/uL (ref 0.7–4.0)
MCH: 35.3 pg — ABNORMAL HIGH (ref 26.0–34.0)
MCHC: 34.3 g/dL (ref 30.0–36.0)
MCV: 102.8 fL — ABNORMAL HIGH (ref 80.0–100.0)
Monocytes Absolute: 0.8 10*3/uL (ref 0.1–1.0)
Monocytes Relative: 7 %
Neutro Abs: 8.8 10*3/uL — ABNORMAL HIGH (ref 1.7–7.7)
Neutrophils Relative %: 73 %
Platelets: 50 10*3/uL — ABNORMAL LOW (ref 150–400)
RBC: 3.91 MIL/uL — ABNORMAL LOW (ref 4.22–5.81)
RDW: 17.8 % — ABNORMAL HIGH (ref 11.5–15.5)
WBC: 12.2 10*3/uL — ABNORMAL HIGH (ref 4.0–10.5)
nRBC: 0 % (ref 0.0–0.2)

## 2019-10-06 LAB — RENAL FUNCTION PANEL
Albumin: 1.7 g/dL — ABNORMAL LOW (ref 3.5–5.0)
Anion gap: 11 (ref 5–15)
BUN: 66 mg/dL — ABNORMAL HIGH (ref 8–23)
CO2: 36 mmol/L — ABNORMAL HIGH (ref 22–32)
Calcium: 8 mg/dL — ABNORMAL LOW (ref 8.9–10.3)
Chloride: 99 mmol/L (ref 98–111)
Creatinine, Ser: 2.68 mg/dL — ABNORMAL HIGH (ref 0.61–1.24)
GFR calc Af Amer: 26 mL/min — ABNORMAL LOW (ref >60)
GFR calc non Af Amer: 23 mL/min — ABNORMAL LOW (ref >60)
Glucose, Bld: 126 mg/dL — ABNORMAL HIGH (ref 70–99)
Phosphorus: 4.4 mg/dL (ref 2.5–4.6)
Potassium: 3.5 mmol/L (ref 3.5–5.1)
Sodium: 146 mmol/L — ABNORMAL HIGH (ref 135–145)

## 2019-10-06 LAB — GRAM STAIN

## 2019-10-06 IMAGING — US IR CHOLANGIOGRAM PERCUTANEOUS TRANSHEPATIC
10 series · 11 of 11 positions shown · non-contrast
Comparison: none

INDICATION: 74-year-old male with central hilar tumor, obstructing biliary
system. He has been referred for biliary drainage

[Series 1: single · 1 of 1 slices shown (1 of 9)]
[im 1/1]
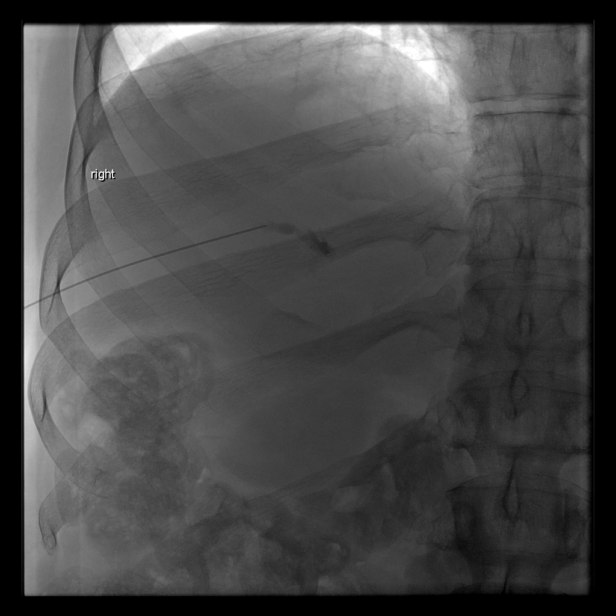

[Series 1: ir cholangiogram percutaneous transhepatic · 2 of 2 slices shown]
[im 1/2]
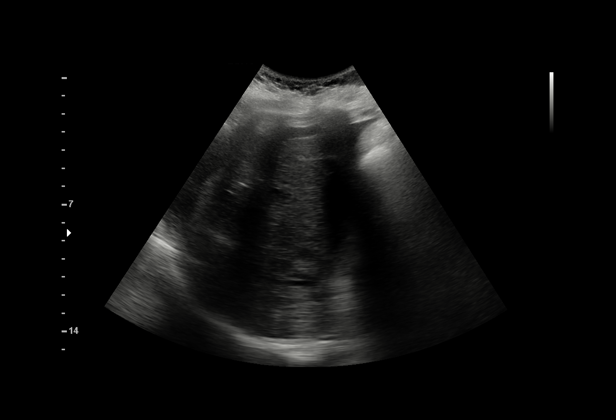
[im 2/2]
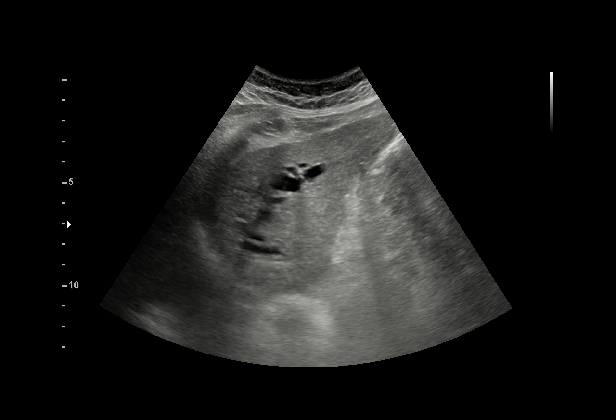

[Series 2: single · 1 of 1 slices shown (2 of 9)]
[im 1/1]
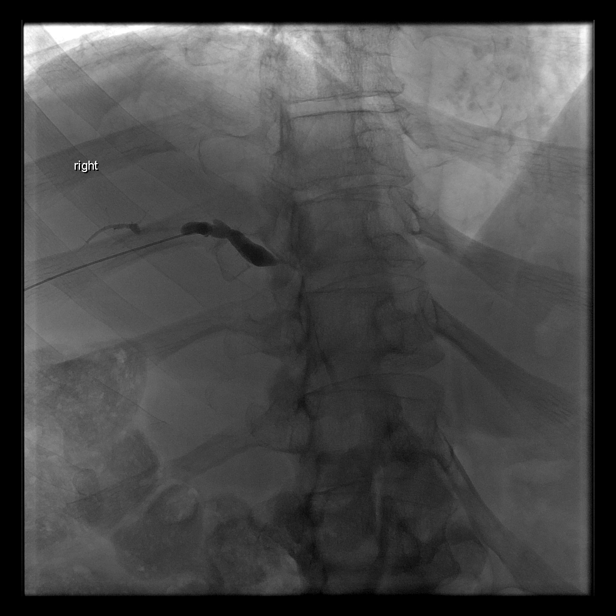

[Series 3: single · 1 of 1 slices shown (3 of 9)]
[im 1/1]
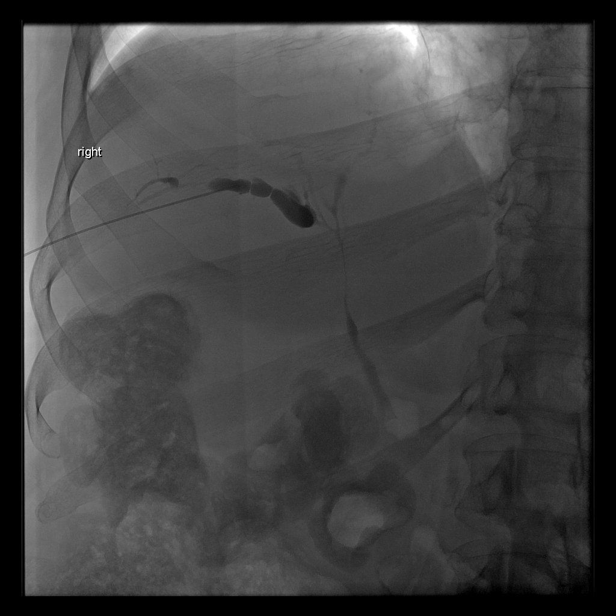

[Series 4: single · 1 of 1 slices shown (4 of 9)]
[im 1/1]
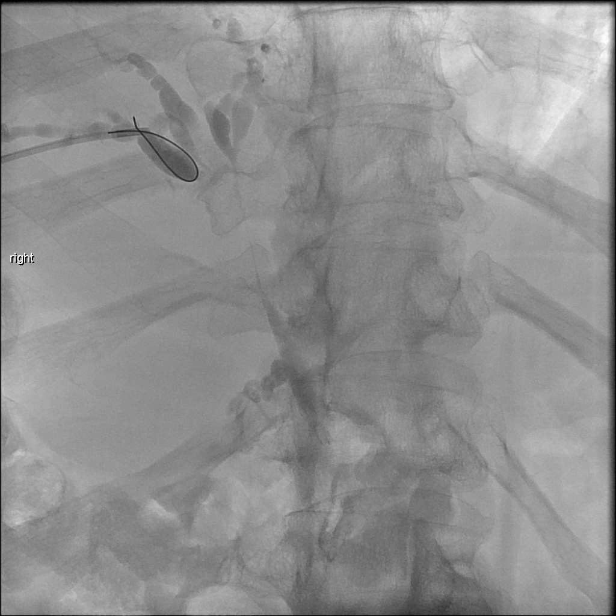

[Series 5: single · 1 of 1 slices shown (5 of 9)]
[im 1/1]
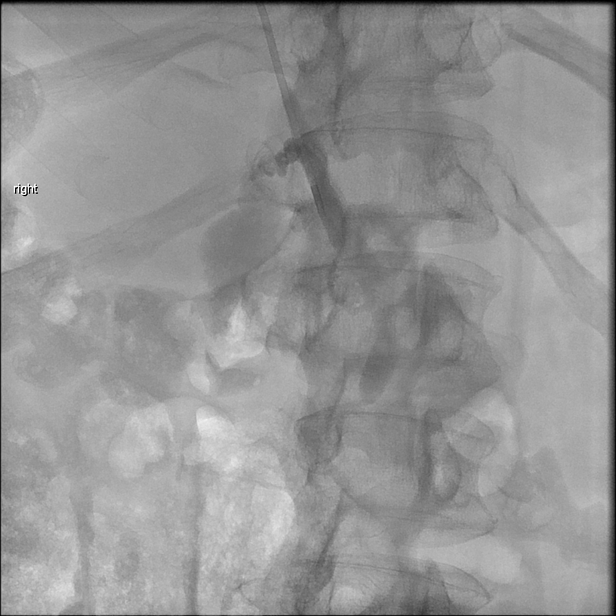

[Series 8: single · 1 of 1 slices shown (6 of 9)]
[im 1/1]
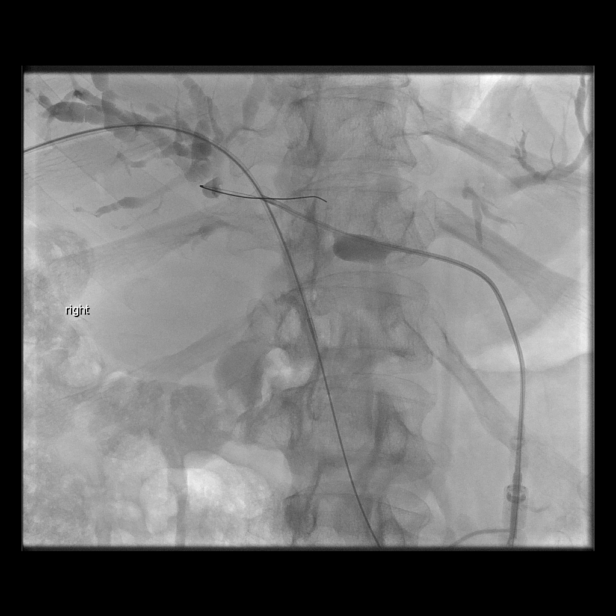

[Series 9: single · 1 of 1 slices shown (7 of 9)]
[im 1/1]
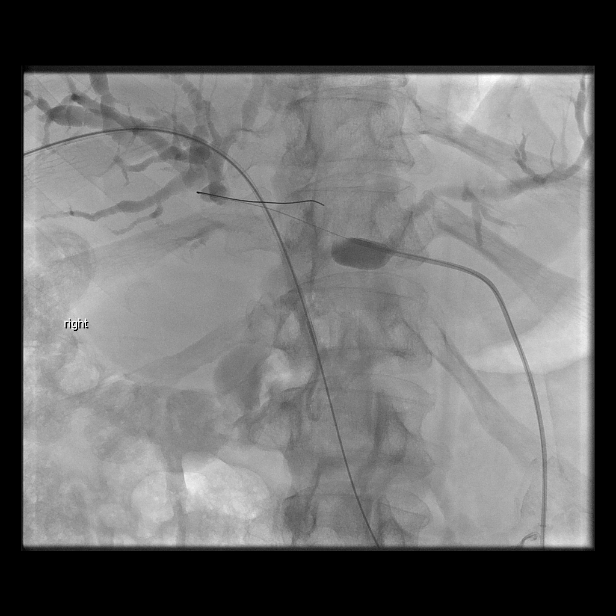

[Series 10: single · 1 of 1 slices shown (8 of 9)]
[im 1/1]
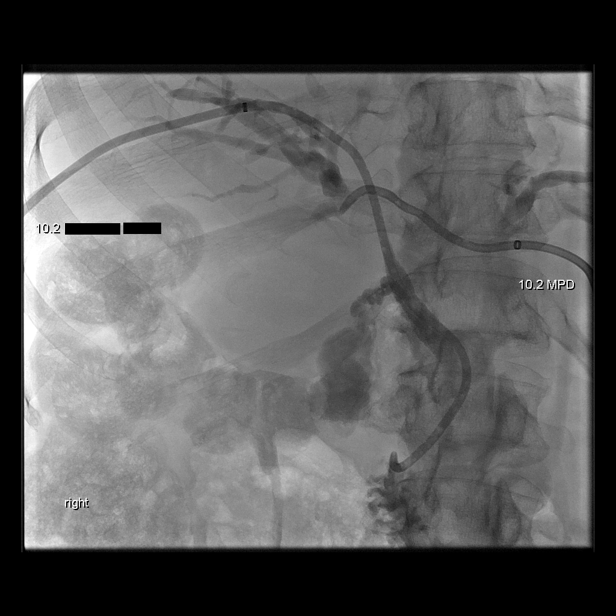

[Series 11: single · 1 of 1 slices shown (9 of 9)]
[im 1/1]
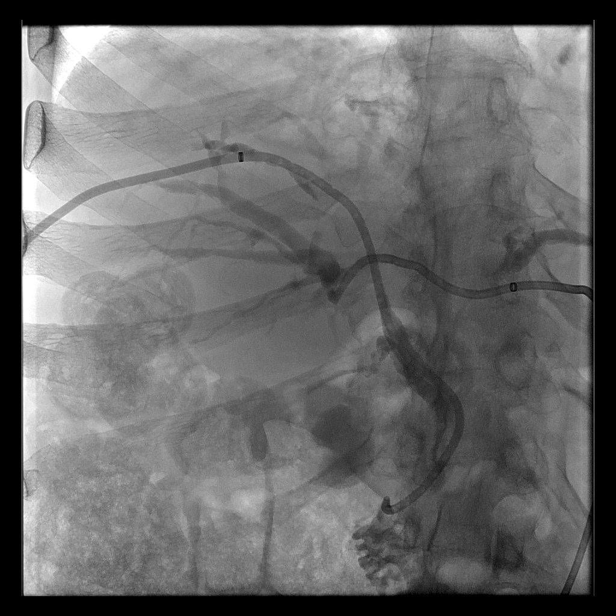

[11 of 11 positions shown; findings below may reference images not displayed]

EXAM:
IMAGE GUIDED PLACEMENT OF RIGHT-SIDED INTERNAL/EXTERNAL BILIARY
DRAIN

ULTRASOUND-GUIDED PLACEMENT OF LEFT HEPATIC EXTERNALIZED BILIARY
DRAIN

MEDICATIONS:
None

ANESTHESIA/SEDATION:
Moderate (conscious) sedation was employed during this procedure. A
total of Versed 3.5 mg and Fentanyl 150 mcg was administered
intravenously.

Moderate Sedation Time: 35 minutes. The patient's level of
consciousness and vital signs were monitored continuously by
radiology nursing throughout the procedure under my direct
supervision.

FLUOROSCOPY TIME:  Fluoroscopy Time: 8 minutes 12 seconds (88 mGy).

COMPLICATIONS:
None

PROCEDURE:
The procedure, risks, benefits, and alternatives were explained to
the patient and the patient's family. A complete informed consent
was performed, with risk benefit analysis. Specific risks that were
discussed for the procedure include bleeding, infection, biliary
sepsis, IC use day, organ injury, need for further procedure, need
for further surgery, long-term drain placement, cardiopulmonary
collapse, death. Questions regarding the procedure were encouraged
and answered. The patient understands and consents to the procedure.

Patient is position in supine position on the fluoroscopy table, and
the upper abdomen was prepped and draped in the usual sterile
fashion. Maximum barrier sterile technique with sterile gowns and
gloves were used for the procedure. A timeout was performed prior to
the initiation of the procedure. Local anesthesia was provided with
1% lidocaine with epinephrine.

Mid axillary line was identified, and a hemostat and ultrasound
imaging was used to identified a reasonable rib space for the first
approach into the right biliary system.

1% lidocaine was used for local anesthesia, with generous
infiltration of the skin and subcutaneous tissues in and intercostal
location. A Chiba needle was advanced under ultrasound guidance into
the right liver lobe.

Two passes of the Chiba needle were required to enter the biliary
system.

Once the tip of the needle was confirmed within the biliary system
by injecting small aliquots of contrast, images were stored of the
biliary system after partially opacifying the biliary tree via the
needle.

Once the tip of this needle was confirmed within the biliary system,
an 018 wire was advanced into the obstructed biliary duct. The
needle was removed, a small incision was made with an 11 blade
scalpel, and then a triaxial Envy system was advanced into the
biliary system. Once the system was in the occluded duct, the metal
stiffener and the inner introducer were removed over the wire. A
check flow valve was advanced over the wire to the back end of the 4
French outer sheath. Contrast was used to opacify the obstructed
duct in multiple obliquity. We were then successful within dancing
the 018 wire into the extrahepatic biliary system. The inner dilator
and the stiffener were advanced over the wire after removing the
check flow valve, and the entire and the system was advanced into
the extrahepatic biliary duct.

Contrast was injected through the 4 French system to confirm
location. A Glidewire and a 4 French glide catheter used to navigate
the Glidewire into the duodenum. Once the catheter was within the
duodenum and the wire was removed, a 180 cm Amplatz wire was placed
into the duodenum.

At this point we turned our attention to the left biliary.

Ultrasound was used to identify a safe approach to the left-sided
biliary system. 1% lidocaine was used for local anesthesia.

Using ultrasound guidance, Chiba needle was used to access a biliary
duct. Once we confirmed with ultrasound images the tip within the
duct, contrast was injected confirming location within an enlarged
left-sided duct. The 018 wire was then passed into the obstructed
duct. A second Envy triaxial system was then advanced on the wire
into the obstructed duct. The inter metal stiffener and the
introducer were removed. Check flow valve was passed on the back of
the 4 French system over the wire. Contrast was injected confirming
we were within the duct. The 018 wire was then passed to the
patient's right, and entered the right-sided occluded ductal system.

We then reassembled the triaxial system and past the entire system
through the central tumor to the right-sided occluded ducts. The
wire and inner stiffener/dilator assembly were removed. Contrast was
injected confirming location within the right-sided duct. A new
short 45 cm Amplatz wire was passed through the catheter. Ten French
dilation was performed.

We then placed a 10 French pigtail externalized drain in a straight
configuration from the occluded right-sided ducts, across the
occlusive tumor in the hepatic hilum, and into the left-sided ducts.

Contrast injected confirmed location of the proximal side holes
within the left-sided system.

Finally, we performed 10 French dilation of the right system with a
10 French dilator and then a 20 cm 10 French introducer from a
sheath system, with dilation as central as possible.

A 10.2 French internal/external biliary drain was then advanced
through the occlusive tumor using the metal stiffener. Once the
drain was within the duodenum the inner stiffener and the wire were
removed. Contrast injected confirmed location of the 2 separate
drains.

Both drainage catheters were sutured in position and attached to
gravity drainage.

A culture was sent to the lab.

Final image was stored.

The patient tolerated the procedure well and remained
hemodynamically stable throughout.

No complications were encountered and no significant blood loss was
encountered.
IMPRESSION: Status post image guided placement of a right percutaneous
transhepatic internal/external biliary drain, which terminates in
the duodenum, as well as ultrasound-guided placement of an
externalized left-sided percutaneous transhepatic biliary drain.

PLAN:
Close follow-up will be required as the drains frequently occlude
after the initial placement.

Short interval follow-up may be attempted for internalization of the
left-sided externalized biliary drain.

## 2019-10-06 MED ORDER — FENTANYL CITRATE (PF) 100 MCG/2ML IJ SOLN
INTRAMUSCULAR | Status: AC | PRN
  Administered 2019-10-06 (×5): 25 ug via INTRAVENOUS

## 2019-10-06 MED ORDER — IOHEXOL 300 MG/ML  SOLN
50.0000 mL | Freq: Once | INTRAMUSCULAR | Status: AC | PRN
  Administered 2019-10-06: 25 mL

## 2019-10-06 MED ORDER — MIDAZOLAM HCL 2 MG/2ML IJ SOLN
INTRAMUSCULAR | Status: AC
  Filled 2019-10-06: qty 2

## 2019-10-06 MED ORDER — FENTANYL CITRATE (PF) 100 MCG/2ML IJ SOLN
INTRAMUSCULAR | Status: AC
Start: 2019-10-06 — End: 2019-10-06
  Filled 2019-10-06: qty 2

## 2019-10-06 MED ORDER — FENTANYL CITRATE (PF) 100 MCG/2ML IJ SOLN
INTRAMUSCULAR | Status: AC
  Filled 2019-10-06: qty 2

## 2019-10-06 MED ORDER — MIDAZOLAM HCL 5 MG/5ML IJ SOLN
INTRAMUSCULAR | Status: AC | PRN
  Administered 2019-10-06: 0.5 mg via INTRAVENOUS

## 2019-10-06 MED ORDER — ONDANSETRON HCL 4 MG/2ML IJ SOLN
INTRAMUSCULAR | Status: AC
  Filled 2019-10-06: qty 2

## 2019-10-06 MED ORDER — MIDAZOLAM HCL 2 MG/2ML IJ SOLN
INTRAMUSCULAR | Status: AC | PRN
  Administered 2019-10-06 (×2): 0.5 mg via INTRAVENOUS
  Administered 2019-10-06: 1 mg via INTRAVENOUS
  Administered 2019-10-06 (×3): 0.5 mg via INTRAVENOUS

## 2019-10-06 MED ORDER — CARVEDILOL 3.125 MG PO TABS
3.1250 mg | ORAL_TABLET | Freq: Once | ORAL | Status: AC
  Administered 2019-10-06: 3.125 mg via ORAL

## 2019-10-06 MED ORDER — LIDOCAINE HCL (PF) 1 % IJ SOLN
INTRAMUSCULAR | Status: AC
  Filled 2019-10-06: qty 10

## 2019-10-06 NOTE — Progress Notes (Signed)
Patient came back from IR after his procedure. He responds to voice, IR nurse recommended patient be on elementary she said patient HR has been all over the place. MD notified. Patient came with 2 drains but the drain on the right side was bleeding, mild pressure applied to stop the bleeding. We continue to monitor.

## 2019-10-06 NOTE — Consult Note (Addendum)
[facility] Nurse Consult Note: Reason for Consult: Consult received for guidance in care of large ruptured serum-filled RLE blister. Also intact serum-filled blisters of LEs. The collaboration of Bedside RN, D. Silverio Lay on assessment is appreciated. Wound type: venous insufficiency Pressure Injury POA: N/A Measurement: largest blister (ruptured, RLE) measures 8cm x 13cm x 0.1cm Wound bed:red, moist Drainage (amount, consistency, odor) serous Periwound: intact. Dressing procedure/placement/frequency: Bedside RN the previous shift implemented this treatment strategy and I am in agreement with the POC for an antimicrobial, non adherent (folded xeroform) with daily changes to both intact and ruptured blisters. Top with dry gauze or ABD pad and lightly secure with Kerlix roll gauze/paper tape. A sacral prophylactic foam dressing is recommended as are bilateral pressure redistribution heel boots to prevent heel or sacral PI.  [facility] nursing team will not follow, but will remain available to this patient, the nursing and medical teams.  Please re-consult if needed. Thanks, Maudie Flakes, MSN, RN, [place], Arther Abbott  Pager# [phone]

## 2019-10-06 NOTE — Progress Notes (Signed)
Called Dr. Ranell Patrick regarding patient's pulse running between 103 up to 120; advised patient's Coreg 3.125mg  wasn't given today; Dr. Ranell Patrick advised me to give him the Coreg 3.125; TORB and continue to monitor patient

## 2019-10-06 NOTE — Procedures (Signed)
Interventional Radiology Procedure Note  Procedure: Image guided drain placement: #1: Right sided int/ext biliary drain, to the duodenum.  10.144F int/ext drain. #2: Left side ext only biliary drain.  10.144F pigtail drain  Complications: None  EBL: None Sample: Culture sent  Recommendations: - Routine drain care, with sterile flushes, record output. Do not aspirate the drains - Close follow up of the output, as the initial drain placed frequently obstruct.  - Possible interval attempt at left sided int/ext biliary drain placement. - follow up Cx - routine wound care  Signed,  Dulcy Fanny. Earleen Newport, DO

## 2019-10-06 NOTE — Progress Notes (Signed)
Patient s/p placement of right sided internal/external biliary drain and left external biliary drain today in IR.  Unable to obtain biopsy as requested as pathology is unavailable on the weekends. We will plan to exchange the drains on Tuesday or Wednesday of this week due to high likelihood of obstruction as well as biopsy at that time (brush vs US guided - will discuss with IR attending that day).   Monitor output and record in [facility] Qshift, flush drains TID, do not aspirate drains, fluid sent for culture.  IR will continue to follow.  Candiss Norse, PA-C

## 2019-10-06 NOTE — Progress Notes (Addendum)
[facility] PHYSICAL MEDICINE & REHABILITATION PROGRESS NOTE   Subjective/Complaints: IR today for biliary drain placement + biopsy  Left eye is painful, but medications help Multiple blisters opened overnight on shins Also has sacral injuries- stable  ROS: Denies CP, SOB, N/V/D  Objective:    No results found. Recent Labs    10/05/19 0651 10/06/19 0539  WBC 9.2 12.2*  HGB 13.0 13.8  HCT 37.8* 40.2  PLT 47* 50*   Recent Labs    10/05/19 0651 10/06/19 0539  NA 142 146*  K 3.1* 3.5  CL 94* 99  CO2 34* 36*  GLUCOSE 116* 126*  BUN 73* 66*  CREATININE 2.74* 2.68*  CALCIUM 7.7* 8.0*    Intake/Output Summary (Last 24 hours) at 10/06/2019 0835 Last data filed at 10/06/2019 4967 Gross per 24 hour  Intake 300 ml  Output 2700 ml  Net -2400 ml        Physical Exam: Vital Signs Blood pressure 106/72, pulse 92, temperature 98.5 F (36.9 C), resp. rate 17, weight 94.8 kg, SpO2 95 %. General: Alert and oriented x 3, No apparent distress HEENT:  Left eye bandaged, mouth dry PERRLA, EOMI, sclera anicteric, oral mucosa pink and moist, dentition intact, ext ear canals clear,  Neck: Supple without JVD or lymphadenopathy Heart: Reg rate and rhythm. + Murmur. Respiratory: Normal effort.  No stridor.  Bilateral clear to auscultation. GI: Non-distended.  BS +. Skin: Vascular changes bilateral lower extremities- 3 blisters popped overnight- two lower on the shins, one large one on right shin. Appear to be healing as expected, no purulence Psych: Flat. Musc: Generalized edema. Left periorbital edema Neuro: Alert Dysarthria, unchanged Motor: RUE/RLE: 3+/5 proximal distal LUE: Limited due to significant tone/rigidity, appears to be 4/5 within available range of motion, unchanged LLE: Hip flexion, knee extension 1/5, ankle dorsiflexion 3/5   Assessment/Plan: 1. Functional deficits secondary to Parkinson's, with recent anterior left globe rupture which require 3+ hours per day of  interdisciplinary therapy in a comprehensive inpatient rehab setting.  Physiatrist is providing close team supervision and 24 hour management of active medical problems listed below.  Physiatrist and rehab team continue to assess barriers to discharge/monitor patient progress toward functional and medical goals  Care Tool:  Bathing    Body parts bathed by patient: Right arm, Left arm, Chest, Abdomen, Face   Body parts bathed by helper: Right arm, Left arm, Chest, Abdomen, Front perineal area, Buttocks, Right upper leg, Left upper leg, Right lower leg, Left lower leg, Face     Bathing assist Assist Level: 2 Helpers     Upper Body Dressing/Undressing Upper body dressing   What is the patient wearing?: Hospital gown only    Upper body assist Assist Level: Maximal Assistance - Patient 25 - 49%    Lower Body Dressing/Undressing Lower body dressing      What is the patient wearing?: Incontinence brief     Lower body assist Assist for lower body dressing: 2 Helpers     Toileting Toileting Toileting Activity did not occur (Clothing management and hygiene only):  (not performed at time of eval, has Foley for urine and no need for BM at time of eval)  Toileting assist Assist for toileting: Dependent - Patient 0%     Transfers Chair/bed transfer  Transfers assist     Chair/bed transfer assist level: 2 Helpers     Locomotion Ambulation   Ambulation assist   Ambulation activity did not occur: Safety/medical concerns (fatigue/weakness)  Walk 10 feet activity   Assist  Walk 10 feet activity did not occur: Safety/medical concerns (fatigue/weakness)        Walk 50 feet activity   Assist Walk 50 feet with 2 turns activity did not occur: Safety/medical concerns (fatigue/weakness)         Walk 150 feet activity   Assist Walk 150 feet activity did not occur: Safety/medical concerns (fatigue/weakness)         Walk 10 feet on uneven surface   activity   Assist Walk 10 feet on uneven surfaces activity did not occur: Safety/medical concerns (fatigue/weakness)         Wheelchair     Assist Will patient use wheelchair at discharge?: Yes Type of Wheelchair: Manual    Wheelchair assist level: Dependent - Patient 0% (tilt in space wc does not allow for patient to propel himself)      Wheelchair 50 feet with 2 turns activity    Assist        Assist Level: Dependent - Patient 0% (tilt in space wc does not allow for patient to propel himself)   Wheelchair 150 feet activity     Assist      Assist Level: Maximal Assistance - Patient 25 - 49%   Medical Problem List and Plan: 1.Functional deficitssecondary to Parkinson's Gait disorder. Recent fall with rupture of anterior left globe.  Continue CIR 2. Antithrombotics: -DVT/anticoagulation:Mechanical:Antiembolism stockings, knee (TED hose) Bilateral lower extremities Sequential compression devices, below kneeBilateral lower extremities -antiplatelet therapy: n/a 3.Abdominal pain/Pain Management:Continue oxycodoneprn  Appears to be controlled with meds on 10/1  10/2: pain in his left eye. Medication does help  Monitor with increased exertion 4. Mood:team to provide ego support -antipsychotic agents: n/a 5. Neuropsych: This patient is not capable of making decisions on his own behalf. 6. Skin/Wound Care:local care as indicated 7. Fluids/Electrolytes/Nutrition:encourage appropriate po 8. Ruptured globe left eye s/p surgical repair 9/16 by ophthalmology Dr. Eulas Post -eye shield at all times -broad spectrum coverage transitioned tolevofloxacin and doxycycline, completed on 9/30 -continueeye gttsfor 2 additional weeks(~10/12) -outpt f/u with Dr. Eulas Post after discharge 9. HTN: -pt on lisinopril, amlodipine, propranolol at home -Coreg started on 9/27    10/2: slightly elevated: continue to monitor.   Moderate increased mobility  10. BWI:OMBTDHRCBULAGT, likely related toATN v/svancomycin Mirabegron ER and finasteride resumed  Creatinine 2.71 on 10/1, 2.68 on 10/3  Continue to monitor 11.Parkinson's Disease Sinemet added recently but not started per notes. Pt followed by Dr. Tomi Likens  Will consider initiation after completion of GI work-up 12. Hyponatremia, mild SIADH: Resolved 13. Prolonged QT interval: Avoid rate prolongation meds  ECG reviewed, showing persistent prolonged QTC 14. Constipation Senna-s two tabs at HS  Adjust bowel meds as necessary  Not moving bowels since NPO 15.Distended abdomen HIDA scan negative.Some chronic findings due to abnl small bowel motility   LFTs remain elevated on 9/29  See #14 16.  Hyperkalemia  Potassium 3.2 on 10/1, labs ordered for tomorrow  Lokelma DC'd  Continue to monitor 17.  Hypoalbuminemia  Supplement initiated on 9/28 18.  Spasticity versus rigidity  See #11  Previously, thought was to consider neck MRI, however given recent findings this will not be indicated at this time 19.  Dysphagia  Continue D2 thins resumed on 9/30 20.  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 ADDENDUM: Patient tolerated procedure well but did have labile HR post-op. Paged medicine for telemetry monitoring but did not hear back. His HR has returned to normal upon return to CIR so did not page again. He is fatigued from anesthesia. ADDENDUM 2: HR increased ovrenight to 120, then down to 110. Administered dose of Coreg he missed earlier today. Discussed with primary team that patient may benefit from  telemetry monitoring. 22.  Thrombocytopenia  Platelets 61 on 9/30, 47 on 10/2, 50 on 10/3  >35 minutes spent in assessing blisters, assessing sacral injuries, reviewing labs, discussing plan for IR procedure today, discussing plan of care with patient and RN Dauda  LOS: 6 days A FACE TO FACE EVALUATION WAS PERFORMED  Clide Deutscher Romey Cohea 10/06/2019, 8:35 AM

## 2019-10-06 NOTE — Progress Notes (Signed)
Blister on R leg covered with xerofoam and kerlix. Dr. Ranell Patrick informed.

## 2019-10-07 ENCOUNTER — Inpatient Hospital Stay (HOSPITAL_COMMUNITY): Payer: Medicare Other

## 2019-10-07 ENCOUNTER — Inpatient Hospital Stay (HOSPITAL_COMMUNITY): Payer: Medicare Other | Admitting: Occupational Therapy

## 2019-10-07 ENCOUNTER — Inpatient Hospital Stay (HOSPITAL_COMMUNITY)
Admission: AD | Admit: 2019-10-07 | Discharge: 2019-11-04 | DRG: 441 | Disposition: E | Payer: Medicare Other | Source: Ambulatory Visit | Attending: Internal Medicine | Admitting: Internal Medicine

## 2019-10-07 ENCOUNTER — Other Ambulatory Visit: Payer: Self-pay

## 2019-10-07 ENCOUNTER — Ambulatory Visit: Payer: 59 | Admitting: Neurology

## 2019-10-07 ENCOUNTER — Encounter (HOSPITAL_COMMUNITY): Payer: Self-pay | Admitting: Internal Medicine

## 2019-10-07 DIAGNOSIS — N179 Acute kidney failure, unspecified: Secondary | ICD-10-CM | POA: Diagnosis present

## 2019-10-07 DIAGNOSIS — S0532XA Ocular laceration without prolapse or loss of intraocular tissue, left eye, initial encounter: Secondary | ICD-10-CM | POA: Diagnosis present

## 2019-10-07 DIAGNOSIS — E8809 Other disorders of plasma-protein metabolism, not elsewhere classified: Secondary | ICD-10-CM | POA: Diagnosis present

## 2019-10-07 DIAGNOSIS — R6510 Systemic inflammatory response syndrome (SIRS) of non-infectious origin without acute organ dysfunction: Secondary | ICD-10-CM | POA: Diagnosis present

## 2019-10-07 DIAGNOSIS — K7290 Hepatic failure, unspecified without coma: Secondary | ICD-10-CM | POA: Diagnosis not present

## 2019-10-07 DIAGNOSIS — I10 Essential (primary) hypertension: Secondary | ICD-10-CM | POA: Diagnosis not present

## 2019-10-07 DIAGNOSIS — K831 Obstruction of bile duct: Secondary | ICD-10-CM | POA: Diagnosis not present

## 2019-10-07 DIAGNOSIS — D72829 Elevated white blood cell count, unspecified: Secondary | ICD-10-CM | POA: Diagnosis present

## 2019-10-07 DIAGNOSIS — E870 Hyperosmolality and hypernatremia: Secondary | ICD-10-CM

## 2019-10-07 DIAGNOSIS — J45909 Unspecified asthma, uncomplicated: Secondary | ICD-10-CM | POA: Diagnosis not present

## 2019-10-07 DIAGNOSIS — B1920 Unspecified viral hepatitis C without hepatic coma: Secondary | ICD-10-CM | POA: Diagnosis present

## 2019-10-07 DIAGNOSIS — S0532XD Ocular laceration without prolapse or loss of intraocular tissue, left eye, subsequent encounter: Secondary | ICD-10-CM | POA: Diagnosis not present

## 2019-10-07 DIAGNOSIS — C801 Malignant (primary) neoplasm, unspecified: Secondary | ICD-10-CM

## 2019-10-07 DIAGNOSIS — Z66 Do not resuscitate: Secondary | ICD-10-CM | POA: Diagnosis present

## 2019-10-07 DIAGNOSIS — E871 Hypo-osmolality and hyponatremia: Secondary | ICD-10-CM | POA: Diagnosis not present

## 2019-10-07 DIAGNOSIS — D6959 Other secondary thrombocytopenia: Secondary | ICD-10-CM | POA: Diagnosis present

## 2019-10-07 DIAGNOSIS — K767 Hepatorenal syndrome: Secondary | ICD-10-CM | POA: Diagnosis not present

## 2019-10-07 DIAGNOSIS — R4182 Altered mental status, unspecified: Secondary | ICD-10-CM | POA: Diagnosis not present

## 2019-10-07 DIAGNOSIS — K7682 Hepatic encephalopathy: Secondary | ICD-10-CM | POA: Diagnosis present

## 2019-10-07 DIAGNOSIS — K7200 Acute and subacute hepatic failure without coma: Secondary | ICD-10-CM | POA: Diagnosis not present

## 2019-10-07 DIAGNOSIS — D696 Thrombocytopenia, unspecified: Secondary | ICD-10-CM | POA: Diagnosis not present

## 2019-10-07 DIAGNOSIS — I462 Cardiac arrest due to underlying cardiac condition: Secondary | ICD-10-CM | POA: Diagnosis not present

## 2019-10-07 DIAGNOSIS — I472 Ventricular tachycardia: Secondary | ICD-10-CM | POA: Diagnosis not present

## 2019-10-07 DIAGNOSIS — R4020 Unspecified coma: Secondary | ICD-10-CM

## 2019-10-07 DIAGNOSIS — R0902 Hypoxemia: Secondary | ICD-10-CM | POA: Diagnosis not present

## 2019-10-07 DIAGNOSIS — K7460 Unspecified cirrhosis of liver: Secondary | ICD-10-CM | POA: Diagnosis present

## 2019-10-07 DIAGNOSIS — Z87891 Personal history of nicotine dependence: Secondary | ICD-10-CM

## 2019-10-07 DIAGNOSIS — Z6841 Body Mass Index (BMI) 40.0 and over, adult: Secondary | ICD-10-CM | POA: Diagnosis not present

## 2019-10-07 DIAGNOSIS — I959 Hypotension, unspecified: Secondary | ICD-10-CM | POA: Diagnosis not present

## 2019-10-07 DIAGNOSIS — G20 Parkinson's disease: Secondary | ICD-10-CM | POA: Diagnosis present

## 2019-10-07 DIAGNOSIS — C240 Malignant neoplasm of extrahepatic bile duct: Secondary | ICD-10-CM | POA: Diagnosis not present

## 2019-10-07 DIAGNOSIS — Z20822 Contact with and (suspected) exposure to covid-19: Secondary | ICD-10-CM | POA: Diagnosis present

## 2019-10-07 DIAGNOSIS — C221 Intrahepatic bile duct carcinoma: Secondary | ICD-10-CM | POA: Diagnosis not present

## 2019-10-07 DIAGNOSIS — R140 Abdominal distension (gaseous): Secondary | ICD-10-CM | POA: Diagnosis not present

## 2019-10-07 DIAGNOSIS — E66813 Obesity, class 3: Secondary | ICD-10-CM | POA: Diagnosis present

## 2019-10-07 LAB — CBC WITH DIFFERENTIAL/PLATELET
Abs Immature Granulocytes: 0.14 10*3/uL — ABNORMAL HIGH (ref 0.00–0.07)
Basophils Absolute: 0 10*3/uL (ref 0.0–0.1)
Basophils Relative: 0 %
Eosinophils Absolute: 0 10*3/uL (ref 0.0–0.5)
Eosinophils Relative: 0 %
HCT: 44.1 % (ref 39.0–52.0)
Hemoglobin: 14.7 g/dL (ref 13.0–17.0)
Immature Granulocytes: 1 %
Lymphocytes Relative: 11 %
Lymphs Abs: 1.9 10*3/uL (ref 0.7–4.0)
MCH: 34.2 pg — ABNORMAL HIGH (ref 26.0–34.0)
MCHC: 33.3 g/dL (ref 30.0–36.0)
MCV: 102.6 fL — ABNORMAL HIGH (ref 80.0–100.0)
Monocytes Absolute: 0.9 10*3/uL (ref 0.1–1.0)
Monocytes Relative: 5 %
Neutro Abs: 13.6 10*3/uL — ABNORMAL HIGH (ref 1.7–7.7)
Neutrophils Relative %: 83 %
Platelets: 52 10*3/uL — ABNORMAL LOW (ref 150–400)
RBC: 4.3 MIL/uL (ref 4.22–5.81)
RDW: 18.4 % — ABNORMAL HIGH (ref 11.5–15.5)
WBC: 16.5 10*3/uL — ABNORMAL HIGH (ref 4.0–10.5)
nRBC: 0.2 % (ref 0.0–0.2)

## 2019-10-07 LAB — COMPREHENSIVE METABOLIC PANEL
ALT: 112 U/L — ABNORMAL HIGH (ref 0–44)
AST: 175 U/L — ABNORMAL HIGH (ref 15–41)
Albumin: 1.6 g/dL — ABNORMAL LOW (ref 3.5–5.0)
Alkaline Phosphatase: 116 U/L (ref 38–126)
Anion gap: 16 — ABNORMAL HIGH (ref 5–15)
BUN: 70 mg/dL — ABNORMAL HIGH (ref 8–23)
CO2: 31 mmol/L (ref 22–32)
Calcium: 7.6 mg/dL — ABNORMAL LOW (ref 8.9–10.3)
Chloride: 103 mmol/L (ref 98–111)
Creatinine, Ser: 3.13 mg/dL — ABNORMAL HIGH (ref 0.61–1.24)
GFR calc Af Amer: 22 mL/min — ABNORMAL LOW (ref >60)
GFR calc non Af Amer: 19 mL/min — ABNORMAL LOW (ref >60)
Glucose, Bld: 78 mg/dL (ref 70–99)
Potassium: 4.3 mmol/L (ref 3.5–5.1)
Sodium: 150 mmol/L — ABNORMAL HIGH (ref 135–145)
Total Bilirubin: 8 mg/dL — ABNORMAL HIGH (ref 0.3–1.2)
Total Protein: 6.2 g/dL — ABNORMAL LOW (ref 6.5–8.1)

## 2019-10-07 LAB — GLUCOSE, CAPILLARY
Glucose-Capillary: 69 mg/dL — ABNORMAL LOW (ref 70–99)
Glucose-Capillary: 89 mg/dL (ref 70–99)

## 2019-10-07 LAB — AMMONIA: Ammonia: 96 umol/L — ABNORMAL HIGH (ref 9–35)

## 2019-10-07 IMAGING — CT CT HEAD W/O CM
4 series · 16 of 47 positions shown, 18 images · non-contrast
Comparison: [DATE]

CLINICAL DATA: Altered mental status.  Acute stroke suspected.

EXAM:
CT HEAD WITHOUT CONTRAST
TECHNIQUE: Contiguous axial images were obtained from the base of the skull
through the vertex without intravenous contrast.

[Series 3: head without · axial · non-contrast · 0.50mm/px · z∈[+1430,+1555]mm · 7 of 35 slices shown, 9 images]
[im 5/35  brain]
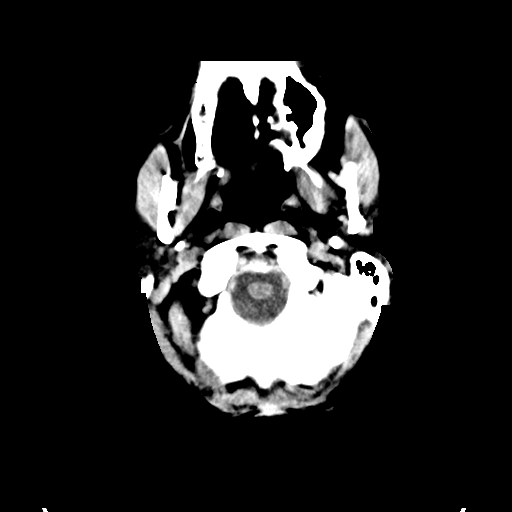
[im 5/35  bone]
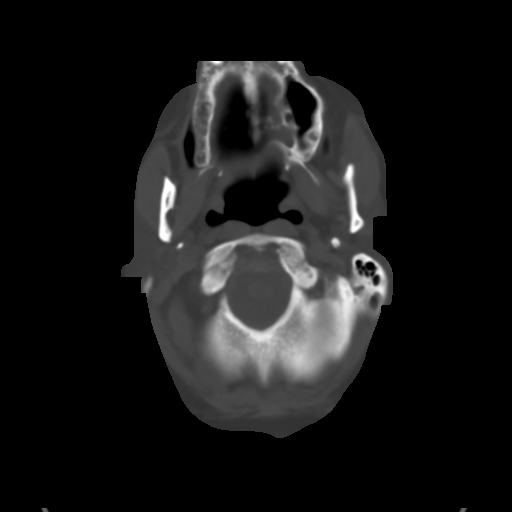
[im 9/35  brain]
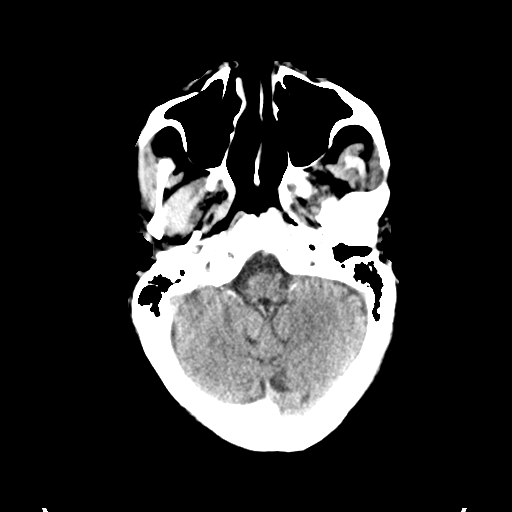
[im 13/35  brain]
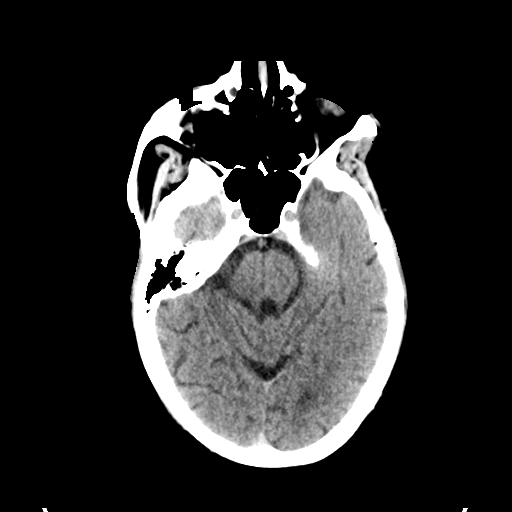
[im 18/35  brain]
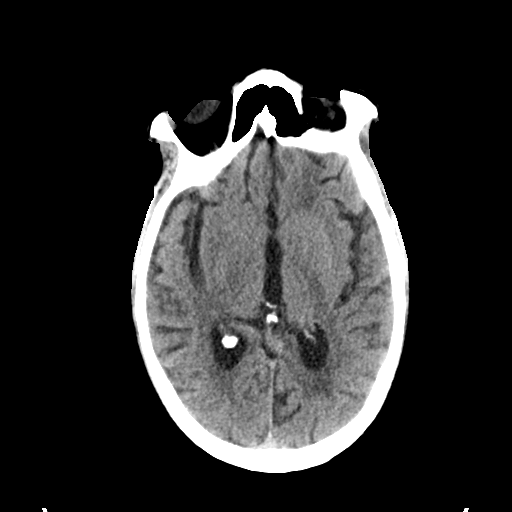
[im 22/35  brain]
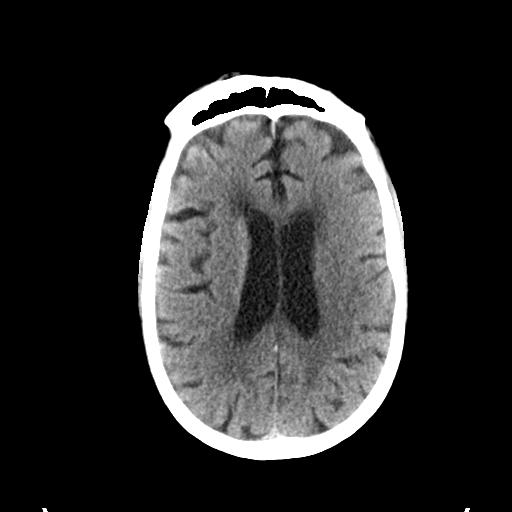
[im 22/35  bone]
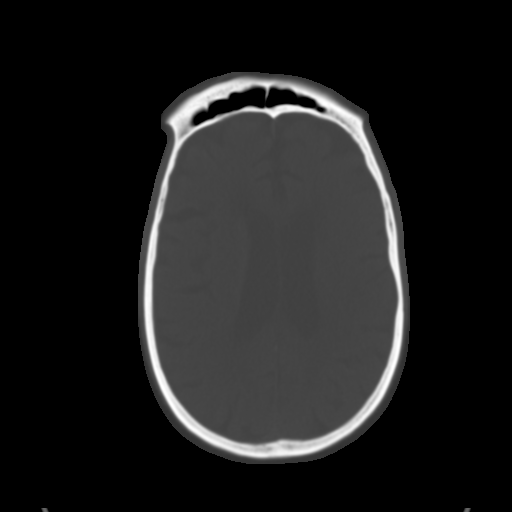
[im 26/35  brain]
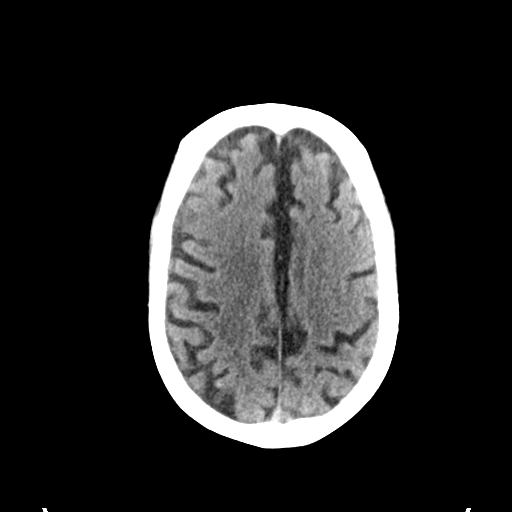
[im 30/35  brain]
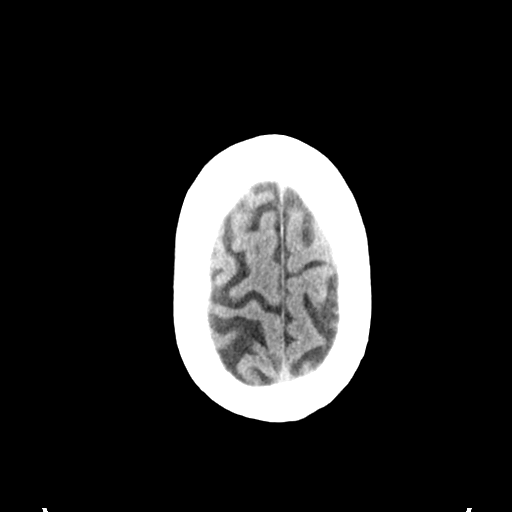

[Series 4: head bone · axial · 0.50mm/px · z∈[+1426,+1460]mm · 3 of 86 slices shown]
[im 9/86  bone]
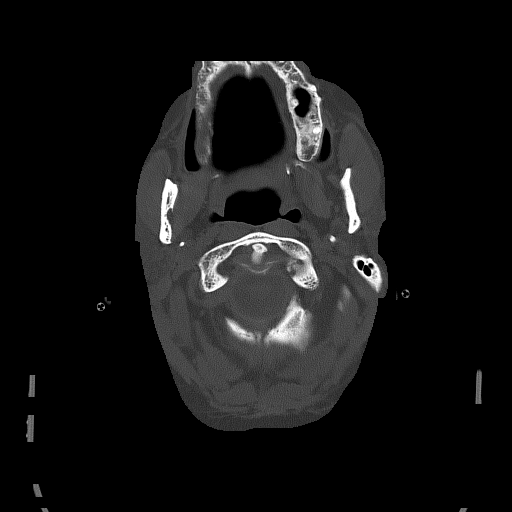
[im 18/86  bone]
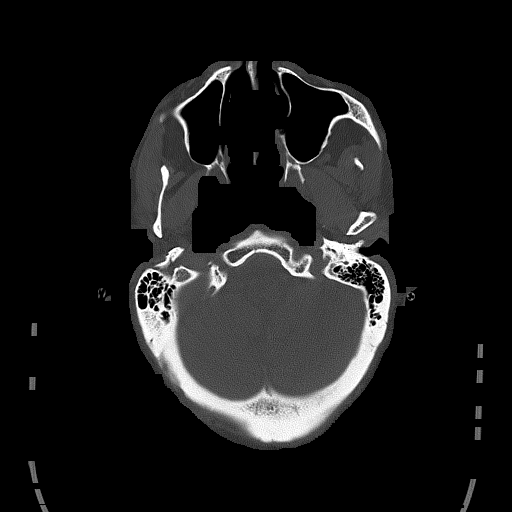
[im 26/86  bone]
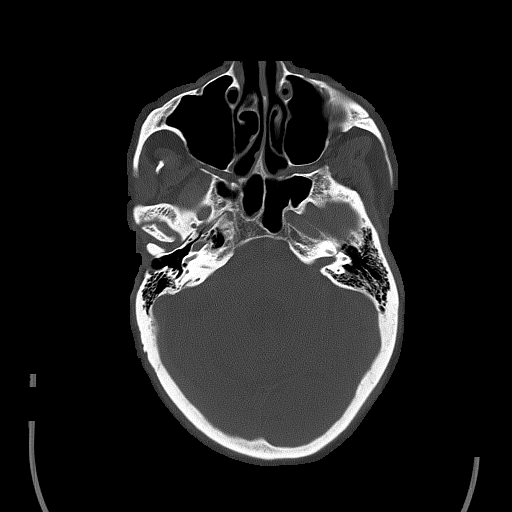

[Series 5: head without cor · coronal · non-contrast · 0.30mm/px · 3 of 80 slices shown]
[im 27/80  brain]
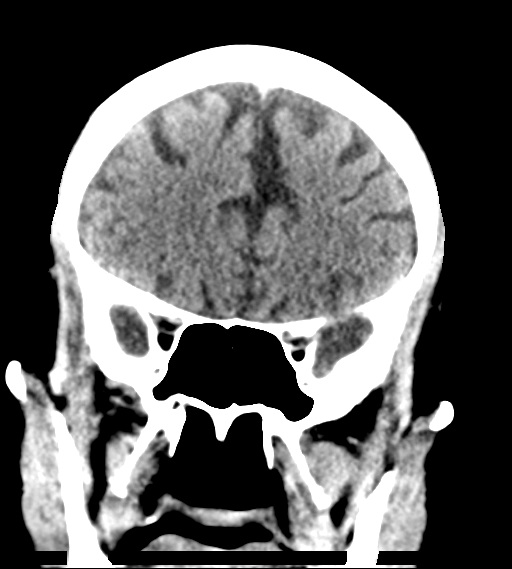
[im 36/80  brain]
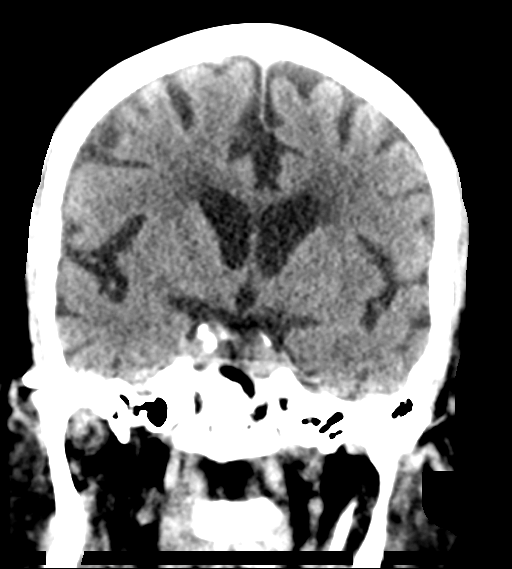
[im 44/80  brain]
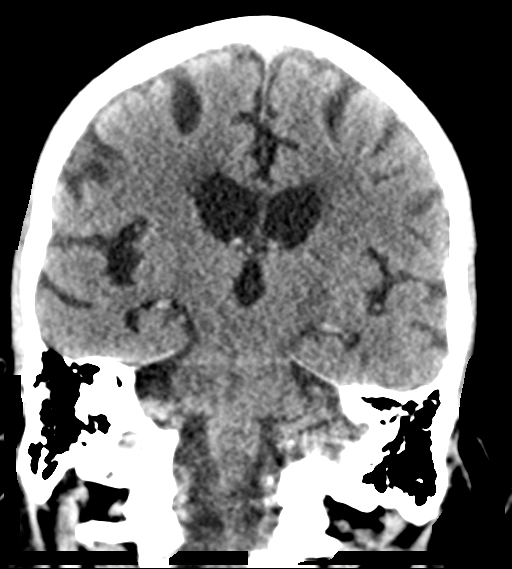

[Series 6: head without sag · sagittal · non-contrast · 0.37mm/px · 3 of 57 slices shown]
[im 20/57  brain]
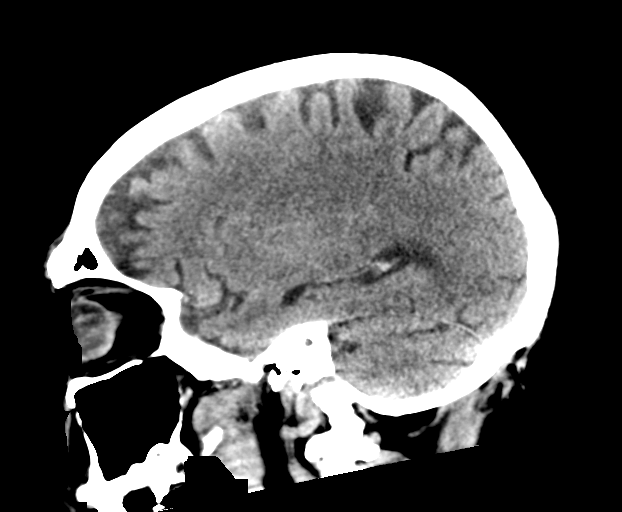
[im 29/57  brain]
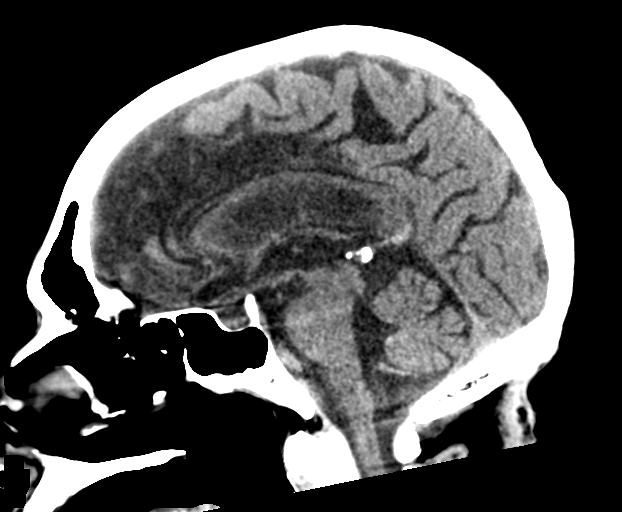
[im 37/57  brain]
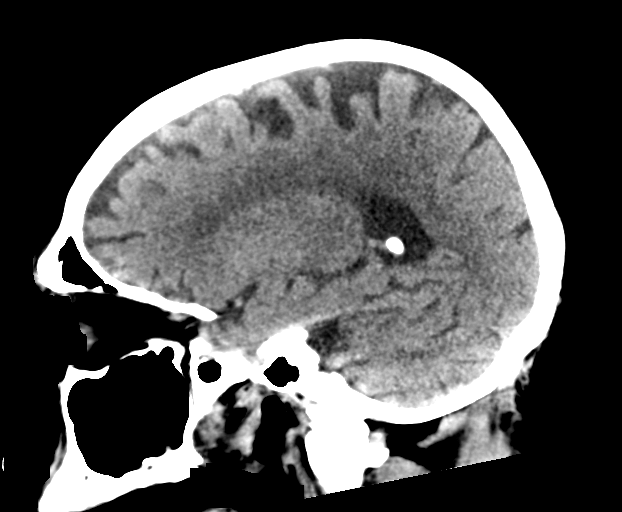

[16 of 47 positions shown; findings below may reference images not displayed]

FINDINGS: Brain: Age related atrophy. Chronic small-vessel ischemic changes of
the hemispheric white matter. No sign of acute infarction, mass
lesion, hemorrhage, hydrocephalus or extra-axial collection.

Vascular: There is atherosclerotic calcification of the major
vessels at the base of the brain.

Skull: Negative

Sinuses/Orbits: Abnormal left globe as seen previously. Sinuses
clear.

Other: None
IMPRESSION: 1. No acute finding by CT. Atrophy and chronic small-vessel ischemic
changes of the white matter.
2. Abnormal left globe as seen previously.

## 2019-10-07 MED ORDER — ALBUMIN HUMAN 25 % IV SOLN
25.0000 g | Freq: Three times a day (TID) | INTRAVENOUS | Status: AC
  Administered 2019-10-07: 25 g via INTRAVENOUS
  Filled 2019-10-07: qty 100

## 2019-10-07 MED ORDER — FLUCONAZOLE 100MG IVPB
100.0000 mg | INTRAVENOUS | Status: DC
  Administered 2019-10-07: 100 mg via INTRAVENOUS
  Filled 2019-10-07 (×2): qty 50

## 2019-10-07 MED ORDER — ALBUMIN HUMAN 25 % IV SOLN: 25.0000 g | Freq: Three times a day (TID) | INTRAVENOUS | Status: DC

## 2019-10-07 MED ORDER — GATIFLOXACIN 0.5 % OP SOLN: 1.0000 [drp] | Freq: Four times a day (QID) | OPHTHALMIC | Status: DC

## 2019-10-07 MED ORDER — PREDNISOLONE ACETATE 1 % OP SUSP
1.0000 [drp] | Freq: Four times a day (QID) | OPHTHALMIC | Status: DC
  Administered 2019-10-07 (×2): 1 [drp] via OPHTHALMIC
  Filled 2019-10-07: qty 5

## 2019-10-07 MED ORDER — LACTATED RINGERS IV SOLN: INTRAVENOUS | Status: DC

## 2019-10-07 MED ORDER — LACTULOSE ENEMA
300.0000 mL | ORAL | Status: DC | PRN
  Filled 2019-10-07: qty 300

## 2019-10-07 MED ORDER — SODIUM CHLORIDE 0.9% FLUSH
3.0000 mL | Freq: Two times a day (BID) | INTRAVENOUS | Status: DC
  Administered 2019-10-07: 3 mL via INTRAVENOUS

## 2019-10-07 MED ORDER — DEXTROSE 5 % IV SOLN: INTRAVENOUS | Status: DC

## 2019-10-07 MED ORDER — SODIUM CHLORIDE 0.9 % IV SOLN
3.0000 g | Freq: Two times a day (BID) | INTRAVENOUS | Status: DC
  Administered 2019-10-08: 3 g via INTRAVENOUS
  Filled 2019-10-07 (×3): qty 8

## 2019-10-07 MED ORDER — ACETAMINOPHEN 650 MG RE SUPP: 650.0000 mg | Freq: Four times a day (QID) | RECTAL | Status: DC | PRN

## 2019-10-07 MED ORDER — ACETAMINOPHEN 325 MG PO TABS: 650.0000 mg | ORAL_TABLET | Freq: Four times a day (QID) | ORAL | Status: DC | PRN

## 2019-10-07 MED ORDER — NEOMYCIN-POLYMYXIN-DEXAMETH 3.5-10000-0.1 OP OINT: 1.0000 "application " | TOPICAL_OINTMENT | Freq: Four times a day (QID) | OPHTHALMIC | Status: DC

## 2019-10-07 MED ORDER — DEXTROSE 50 % IV SOLN
INTRAVENOUS | Status: AC
  Administered 2019-10-07: 25 mL
  Filled 2019-10-07: qty 50

## 2019-10-07 MED ORDER — ONDANSETRON HCL 4 MG/2ML IJ SOLN: 4.0000 mg | Freq: Four times a day (QID) | INTRAMUSCULAR | Status: DC | PRN

## 2019-10-07 MED ORDER — LACTULOSE ENEMA: 300.0000 mL | ORAL | Status: DC | PRN

## 2019-10-07 MED ORDER — MORPHINE SULFATE (PF) 2 MG/ML IV SOLN: 2.0000 mg | INTRAVENOUS | Status: DC | PRN

## 2019-10-07 MED ORDER — GATIFLOXACIN 0.5 % OP SOLN
1.0000 [drp] | Freq: Four times a day (QID) | OPHTHALMIC | Status: DC
  Administered 2019-10-07 (×2): 1 [drp] via OPHTHALMIC
  Filled 2019-10-07: qty 2.5

## 2019-10-07 MED ORDER — HYDRALAZINE HCL 20 MG/ML IJ SOLN: 5.0000 mg | INTRAMUSCULAR | Status: DC | PRN

## 2019-10-07 MED ORDER — NEOMYCIN-POLYMYXIN-DEXAMETH 3.5-10000-0.1 OP OINT
1.0000 "application " | TOPICAL_OINTMENT | Freq: Four times a day (QID) | OPHTHALMIC | Status: DC
  Administered 2019-10-07 (×2): 1 via OPHTHALMIC
  Filled 2019-10-07: qty 3.5

## 2019-10-07 MED ORDER — ONDANSETRON HCL 4 MG PO TABS: 4.0000 mg | ORAL_TABLET | Freq: Four times a day (QID) | ORAL | Status: DC | PRN

## 2019-10-07 MED ORDER — PREDNISOLONE ACETATE 1 % OP SUSP: 1.0000 [drp] | Freq: Four times a day (QID) | OPHTHALMIC | Status: DC

## 2019-10-07 NOTE — Significant Event (Signed)
Rapid Response Event Note   Reason for Call :  AMS He has recently arrived from [place], see MD note for details [place] 89  Initial Focused Assessment:  Patient is unresponsive to staff.  He moans and moves his head and right arm spontaneously.   He has a left facial droop and no spontaneous movement of his left arm and leg.  He does with draw from pain in the left leg and has some tone in the left arm when lifted.  He is breathing easily, no airway concerns at this time His lung sounds are clear Heart tones regular, with murmur BP 110/74  ST 120  RR 19  O2 sat 94 on RA  Interventions:  CBG 69 1/2 amp D50 given IV D5 gtt started at 40cc/hr  Plan of Care:  Head Ct Lactulose enema   Event Summary:   MD Notified: prior to my arrival Call Time:  [place] Time:  [place] Time:  [place]  Raliegh Ip, RN

## 2019-10-07 NOTE — Progress Notes (Signed)
Patient transfer to acute due to change in condition, wife at bedside. All patient belongings were sent with him. Wife advise not to leave any belonging behind. We continue to monitor.

## 2019-10-07 NOTE — H&P (Addendum)
History and Physical    Nicholas Ramirez ZOX:[id number] DOB: 06/11/1945 DOA: 10/24/2019  PCP: Townsend Roger, MD Consultants:  Tomi Likens - neurology Patient coming from:  Home - lives with wife; NOK: Wife, Onie Hayashi, [phone]  Chief Complaint: AMS  HPI: Nicholas Ramirez is a 74 y.o. male with medical history significant of  HTN; Hep C with cirrhosis; possible Parkinson's, recently prescribed medications but has not started; and obesity (BMI 42.53) who was previously admitted for multiple falls.  He was admitted from 9/15-27 for his most recent fall, with left globe rupture.  His wife reports that about 6 years ago he was diagnosed with Hep C.  He was treated for 12 weeks but he was still positive and so he was treated again and then converted to negative and back to positive later.  It caused some cirrhosis but he had normal liver functions.  He was having routine ultrasounds to monitor his progress.  In 01/2018 he had carpal tunnel surgery and healed well but was never able to use  His hand.  After that, he started falling - would fall out of bed and when walking, uncertain reason why.  He also developed some LE edema and he was started on a fluid pill.  He continued to fall frequently.  They saw a neurologist on 9/13; MRI was unremarkable and he was concerned for Parkinson's but possibly multifactorial.  He was started on Parkinson's meds as a trial - but he did not start it.  He fell on 9/15 with globe rupture; during his hospitalization he had surgical repair and was recommend to have long-term antibiotics.  He developed AKI during hospitalization; nephrology was consulted, Vanc was stopped, a foley was placed for retention, and HCTZ/lisinopril were held - these interventions resulted in slow improvement in his renal function without return to the baseline.  He also had elevated LFTs and bilirubin during the hospitalization.  RUQ [place] and HIDA scan were unremarkable.  He was doing well at the time  of rehab transfer.  Since transfer to rehab, his LFTs were up a bit.  They thought he was doing well but his gallbladder appeared to be abnormal.  His wife thought he was doing well with rehab.  On Saturday, he was alert and talking and very thirsty; he participated in therapy that day.  He was supposed to have biopsy and drain placement but they did that Sunday instead.  He did therapy that day.  When she came in today, he was obtunded and groaning.     Rehab Course:  Presented with a fall, L globe rupture.  Ophthalmology is following, ?need for enucleation.  Had abdominal pain with distended gallbladder.  Has a mass, followed by Dr. Ardis Hughs - family wants everything done.  Drains placed yesterday and lethargic and minimally responsive since.  Tachycardia, limited PO intake.  Also with anasarca so reluctant to start IVF.  Not able to participate in therapy.  Needs to transfer to acute floor for closer monitoring - tele vs. Progressive.   Review of Systems: Unable to perform    Past Medical History:  Diagnosis Date  . Asthma   . Depression   . Heart murmur   . Hepatitis C   . Hypertension     Past Surgical History:  Procedure Laterality Date  . CARPAL TUNNEL RELEASE Left   . IR PARACENTESIS  10/03/2019  . IR PERCUTANEOUS TRANSHEPATIC CHOLANGIOGRAM  10/06/2019  . RUPTURED GLOBE EXPLORATION AND REPAIR Left 09/18/2019   Procedure:  REPAIR OF RUPTURED GLOBE;  Surgeon: Lonia Skinner, MD;  Location: [place];  Service: Ophthalmology;  Laterality: Left;    Social History   Socioeconomic History  . Marital status: Married    Spouse name: Not on file  . Number of children: Not on file  . Years of education: Not on file  . Highest education level: Not on file  Occupational History  . Not on file  Tobacco Use  . Smoking status: Former Smoker    Quit date: 1987    Years since quitting: 34.7  . Smokeless tobacco: Never Used  Substance and Sexual Activity  . Alcohol use: Never  . Drug use:  Not Currently  . Sexual activity: Not on file  Other Topics Concern  . Not on file  Social History Narrative  . Not on file   Social Determinants of Health   Financial Resource Strain:   . Difficulty of Paying Living Expenses: Not on file  Food Insecurity:   . Worried About Charity fundraiser in the Last Year: Not on file  . Ran Out of Food in the Last Year: Not on file  Transportation Needs:   . Lack of Transportation (Medical): Not on file  . Lack of Transportation (Non-Medical): Not on file  Physical Activity:   . Days of Exercise per Week: Not on file  . Minutes of Exercise per Session: Not on file  Stress:   . Feeling of Stress : Not on file  Social Connections:   . Frequency of Communication with Friends and Family: Not on file  . Frequency of Social Gatherings with Friends and Family: Not on file  . Attends Religious Services: Not on file  . Active Member of Clubs or Organizations: Not on file  . Attends Archivist Meetings: Not on file  . Marital Status: Not on file  Intimate Partner Violence:   . Fear of Current or Ex-Partner: Not on file  . Emotionally Abused: Not on file  . Physically Abused: Not on file  . Sexually Abused: Not on file    Allergies  Allergen Reactions  . Oxybutynin Other (See Comments)    cramps  . Sertraline Itching  . Tadalafil Rash    Family History  Problem Relation Age of Onset  . Alzheimer's disease Mother   . High blood pressure Mother   . Cancer Father     Prior to Admission medications   Medication Sig Start Date End Date Taking? Authorizing Provider  aspirin 81 MG EC tablet Take 81 mg by mouth daily.    [provider]  carbidopa-levodopa (SINEMET IR) 25-100 MG tablet Take 1/2 tablet in AM and bedtime for one week, then 1/2 tablet three times daily for one week, then take 1 tablet three times daily. 09/16/19   Pieter Partridge, DO  cetirizine (ZYRTEC) 10 MG tablet Take 10 mg by mouth daily.    [provider]  DULoxetine (CYMBALTA) 60 MG capsule Take 60 mg by mouth daily. 07/03/19   [provider]  finasteride (PROSCAR) 5 MG tablet Take 5 mg by mouth daily. 07/03/19   [provider]  gatifloxacin (ZYMAXID) 0.5 % SOLN Place 1 drop into the left eye 4 (four) times daily for 15 days. 09/30/19 10/15/19  Amin, Ankit Chirag, MD  MYRBETRIQ 50 MG TB24 tablet Take 50 mg by mouth daily. 09/02/19   [provider]  neomycin-polymyxin b-dexamethasone (MAXITROL) 3.5-10000-0.1 OINT Place 1 application into the left eye  4 (four) times daily for 15 days. 09/30/19 10/15/19  Amin, Jeanella Flattery, MD  nystatin (MYCOSTATIN) 100000 UNIT/ML suspension Take 5 mLs (500,000 Units total) by mouth 4 (four) times daily for 7 days. 09/30/19 11/01/2019  Amin, Jeanella Flattery, MD  polyethylene glycol (MIRALAX / GLYCOLAX) 17 g packet Take 17 g by mouth 2 (two) times daily. 09/30/19   Amin, Jeanella Flattery, MD  prednisoLONE acetate (PRED FORTE) 1 % ophthalmic suspension Place 1 drop into the left eye 4 (four) times daily for 15 days. 09/30/19 10/15/19  Amin, Jeanella Flattery, MD  propranolol (INDERAL) 40 MG tablet Take 40 mg by mouth 2 (two) times daily. 09/04/19   [provider]  senna-docusate (SENOKOT-S) 8.6-50 MG tablet Take 1 tablet by mouth 2 (two) times daily. 09/30/19   Damita Lack, MD    Physical Exam: Vitals:   11/02/2019 1400  BP: 110/74  Pulse: (!) 120  Resp: 19  SpO2: 94%     . General:  Opens eyes to voice/touch but only unintelligible speech . Eyes:   Minimally open; L eye bandage in place . ENT:  grossly normal hearing, lips & tongue, mildly dry mm . Neck:  no LAD, masses or thyromegaly . Cardiovascular:  RR with tachycardia, no m/r/g. Mild diffuse edema.  Marland Kitchen Respiratory:   CTA bilaterally with no wheezes/rales/rhonchi.  Normal respiratory effort. . Abdomen:  RUQ drain and left mid-abdomen drains in place . Skin:  B LE Unna boots in place; diffuse primarily UE  ecchymoses . Musculoskeletal:  decreased tone BUE/BLE, no bony abnormality . Lower extremity:  No LE edema.  Limited foot exam with no ulcerations.  2+ distal pulses. Marland Kitchen Psychiatric:  Minimally responsive, opens right eye to voice/touch, nonsensical speech . Neurologic: unable to perform    Radiological Exams on Admission: CT HEAD WO CONTRAST  Result Date: 10/17/2019 CLINICAL DATA:  Altered mental status.  Acute stroke suspected. EXAM: CT HEAD WITHOUT CONTRAST TECHNIQUE: Contiguous axial images were obtained from the base of the skull through the vertex without intravenous contrast. COMPARISON:  09/18/2019 FINDINGS: Brain: Age related atrophy. Chronic small-vessel ischemic changes of the hemispheric white matter. No sign of acute infarction, mass lesion, hemorrhage, hydrocephalus or extra-axial collection. Vascular: There is atherosclerotic calcification of the major vessels at the base of the brain. Skull: Negative Sinuses/Orbits: Abnormal left globe as seen previously. Sinuses clear. Other: None IMPRESSION: 1. No acute finding by CT. Atrophy and chronic small-vessel ischemic changes of the white matter. 2. Abnormal left globe as seen previously. Electronically Signed   By: Nelson Chimes M.D.   On: 10/28/2019 16:41   IR PERCUTANEOUS TRANSHEPATIC CHOLANGIOGRAM  Result Date: 10/06/2019 INDICATION: 74 year old male with central hilar tumor, obstructing biliary system. He has been referred for biliary drainage EXAM: IMAGE GUIDED PLACEMENT OF RIGHT-SIDED INTERNAL/EXTERNAL BILIARY DRAIN ULTRASOUND-GUIDED PLACEMENT OF LEFT HEPATIC EXTERNALIZED BILIARY DRAIN MEDICATIONS: None ANESTHESIA/SEDATION: Moderate (conscious) sedation was employed during this procedure. A total of Versed 3.5 mg and Fentanyl 150 mcg was administered intravenously. Moderate Sedation Time: 35 minutes. The patient's level of consciousness and vital signs were monitored continuously by radiology nursing throughout the procedure under my  direct supervision. FLUOROSCOPY TIME:  Fluoroscopy Time: 8 minutes 12 seconds (88 mGy). COMPLICATIONS: None PROCEDURE: The procedure, risks, benefits, and alternatives were explained to the patient and the patient's family. A complete informed consent was performed, with risk benefit analysis. Specific risks that were discussed for the procedure include bleeding, infection, biliary sepsis, IC use day, organ injury, need for  further procedure, need for further surgery, long-term drain placement, cardiopulmonary collapse, death. Questions regarding the procedure were encouraged and answered. The patient understands and consents to the procedure. Patient is position in supine position on the fluoroscopy table, and the upper abdomen was prepped and draped in the usual sterile fashion. Maximum barrier sterile technique with sterile gowns and gloves were used for the procedure. A timeout was performed prior to the initiation of the procedure. Local anesthesia was provided with 1% lidocaine with epinephrine. Mid axillary line was identified, and a hemostat and ultrasound imaging was used to identified a reasonable rib space for the first approach into the right biliary system. 1% lidocaine was used for local anesthesia, with generous infiltration of the skin and subcutaneous tissues in and intercostal location. A Chiba needle was advanced under ultrasound guidance into the right liver lobe. Two passes of the Chiba needle were required to enter the biliary system. Once the tip of the needle was confirmed within the biliary system by injecting small aliquots of contrast, images were stored of the biliary system after partially opacifying the biliary tree via the needle. Once the tip of this needle was confirmed within the biliary system, an 018 wire was advanced into the obstructed biliary duct. The needle was removed, a small incision was made with an 11 blade scalpel, and then a triaxial Envy system was advanced into the  biliary system. Once the system was in the occluded duct, the metal stiffener and the inner introducer were removed over the wire. A check flow valve was advanced over the wire to the back end of the 4 French outer sheath. Contrast was used to opacify the obstructed duct in multiple obliquity. We were then successful within dancing the 018 wire into the extrahepatic biliary system. The inner dilator and the stiffener were advanced over the wire after removing the check flow valve, and the entire and the system was advanced into the extrahepatic biliary duct. Contrast was injected through the 4 [place] system to confirm location. A Glidewire and a 4 [place] glide catheter used to navigate the Glidewire into the duodenum. Once the catheter was within the duodenum and the wire was removed, a 180 cm Amplatz wire was placed into the duodenum. At this point we turned our attention to the left biliary. Ultrasound was used to identify a safe approach to the left-sided biliary system. 1% lidocaine was used for local anesthesia. Using ultrasound guidance, Chiba needle was used to access a biliary duct. Once we confirmed with ultrasound images the tip within the duct, contrast was injected confirming location within an enlarged left-sided duct. The 018 wire was then passed into the obstructed duct. A second Envy triaxial system was then advanced on the wire into the obstructed duct. The inter metal stiffener and the introducer were removed. Check flow valve was passed on the back of the 4 [place] system over the wire. Contrast was injected confirming we were within the duct. The 018 wire was then passed to the patient's right, and entered the right-sided occluded ductal system. We then reassembled the triaxial system and past the entire system through the central tumor to the right-sided occluded ducts. The wire and inner stiffener/dilator assembly were removed. Contrast was injected confirming location within the right-sided  duct. A new short 45 cm Amplatz wire was passed through the catheter. Ten French dilation was performed. We then placed a 10 French pigtail externalized drain in a straight configuration from the occluded right-sided ducts, across the  occlusive tumor in the hepatic hilum, and into the left-sided ducts. Contrast injected confirmed location of the proximal side holes within the left-sided system. Finally, we performed 10 French dilation of the right system with a 10 [place] dilator and then a 20 cm 10 French introducer from a sheath system, with dilation as central as possible. A 10.2 French internal/external biliary drain was then advanced through the occlusive tumor using the metal stiffener. Once the drain was within the duodenum the inner stiffener and the wire were removed. Contrast injected confirmed location of the 2 separate drains. Both drainage catheters were sutured in position and attached to gravity drainage. A culture was sent to the lab. Final image was stored. The patient tolerated the procedure well and remained hemodynamically stable throughout. No complications were encountered and no significant blood loss was encountered. IMPRESSION: Status post image guided placement of a right percutaneous transhepatic internal/external biliary drain, which terminates in the duodenum, as well as ultrasound-guided placement of an externalized left-sided percutaneous transhepatic biliary drain. Signed, Dulcy Fanny. Dellia Nims, RPVI [facility] Radiology PLAN: Close follow-up will be required as the drains frequently occlude after the initial placement. Short interval follow-up may be attempted for internalization of the left-sided externalized biliary drain. Electronically Signed   By: Corrie Mckusick D.O.   On: 10/06/2019 10:55    EKG from 9/28: Independently reviewed.  NSR with rate 105; QTc 494;  nonspecific ST changes with no evidence of acute ischemia   Labs on  Admission: I have personally reviewed the available labs and imaging studies at the time of the admission.  Pertinent labs:   Na++ 150 BUN 70/Creatinine 3.13/GFR 19 - baseline creatinine was normal on 9/1 but abnormal since and as high as 5.05 on 9/20 Albumin 1.6 AST 175/ALT 112/Bili 8.0 WBC 16.5 Platelets 52   Assessment/Plan Principal Problem:   Altered mental status Active Problems:   Ruptured globe of left eye   Thrombocytopenia (HCC)   Hepatic encephalopathy (HCC)   Klatskin's tumor (HCC)   Cirrhosis of liver without ascites (HCC)   Hepatorenal syndrome (HCC)   Acute renal failure (ARF) (HCC)   Obesity, Class III, BMI 40-49.9 (morbid obesity) ([place])   DNR (do not resuscitate)    AMS -Patient was in rehab and developed acutely AMS -Upon my evaluation this AM, he appeared to have hepatic encephalopathy - there was no focal neurologic deficit but he was clearly altered with nonsensical speech -NH4 level was checked and was definitely elevated - 96 with no h/o prior -As such, patient was transferred back to the acute hospital on progressive care for treatment -However, upon arrival on 2W several hours later, there was concern for left-sided facial droop and L hemiparesis - the patient was unable to follow commands but was freely moving his right side but not his left -RRT was called -His wife does report possible prior LUE decreased movement chronically, although this is uncertain and she is not currently present -Head CT ordered and does not indicate acute finding -I went back this PM to evaluate the patient and he is mildly less interactive than prior and is no longer chattering with nonsensical speech or groaning but there is no clear focal deficit at this time -As such, will plan to attempt to clear his sensorium with lactulose to allow for a more effective neurologic exam once he is no longer encephalopathic -If not improving and/or focal neurologic deficit is appreciated,  consider MRI - vs. Goals of  care discussion  Hepatorenal syndrome with hepatic encephalopathy -He has had fluctuating but acutely altered renal function throughout his hospitalization -He has also had progressive hepatic impairment in the setting of known chronic and refractory Hep C-associated cirrhosis -He also has a mass (see below) -Ammonia 96 without known baseline -He does appear to be encephalopathic with altered mental status -He may have underlying sepsis (see below) -Regardless of the cause, he does have AMS and needs empiric treatment for encephalopathy with lactulose titrated to 2-3 soft stools/day -If this is the cause, would anticipate improvement in mental status in the next 12-24 hours -MELD/MELD-Na score is 29, with a mortality rate of 19.6% -Dr. Havery Moros is consulting  Possible sepsis -SIRS criteria in this patient includes: Leukocytosis, tachycardia  -Patient has evidence of acute organ failure with encephalopathy - this may be explained by another condition -Currently low suspicion for sepsis -However, he did have drain placement yesterday and has worsening leukocytosis and AMS so will check blood cultures (negative gram stain and body culture yesterday) and cover empirically with antibiotics (Unasyn)  Liver mass -MRCP on 10/1showed marked intrahepatic biliary ductal dilatation with concern for central liver obstruction - suspicious for cholangiocarcinoma/Klatskin tumor -Ideally, this would improve with drain placement - which was done yesterday -Unfortunately, his LFTs have continued to rise and this is concerning for more diffuse individual ductal occlusion -Patient was discussed with Dr. Pascal Lux -The current plan is to continue to monitor daily CMP without further intervention -If improving, he will need biopsy to confirm and oncology; however, since he is unlikely to be a surgical candidate, at best this is likely a palliative oncology situation -If not improving,  this appears to be a terminal condition and he will likely suffer an in-hospital demise -I have discussed this with his wife and she would still like to treat his encephalopathy for now - but also appears to be realistic that this may not work; will consult palliative care  Acute renal failure with hypernatremia -Normal baseline on presentation -He was thought to have ATN with some improvement, although it appears to have stabilized and even worsened -He appears to have a prerenal acute component today -Nephrology is consulting and ordered IV albumin support -Hypernatremia is thought to be due to ATN-associated diuresis with impaired ability to replace free water in the setting of AMS -Nephrology is managing fluids and is running low-dose D5W at this time  Possible Parkinson's -He was seen by Dr. Tomi Likens on 9/13 -He was noted to have left-sided weakness and numbness with noted bradykinesis on exam -This was suggestive of Parkinson's -He was ordered to have an MRI of the C-spine - but was admitted 2 days later and so this hasn't happened -He was also written to start a carbidopa-levodopa trial - although this has not been started yet -Given the remainder of his medical issues, will continue to hold these medications at this time  Ruptured globe -Using drops and following guidance per ophthalmology  Anasarca due to hypoalbuminemia -Markedly low albumin  -Mild current anasarca -Nephrology is providing IV albumin  -Has Unna boots in place  Thrombocytopenia -Likely associated with cirrhosis -This may further complicate his situation if CVA is thought to be present  Morbid obesity -BMI 42.53 - likely artificially elevated by anasarca -Weight loss should be encouraged if he has meaningful clinical improvement  DNR/Goals of care -I have discussed code status with the patient's wife and the patient would not desire resuscitation and would prefer to die a natural death  should that situation  arise. -He will need a gold out of facility DNR form at the time of discharge if he does not have an in-hospital demise -He is acutely on chronically severely ill and has a poor overall prognosis -Palliative care consult requested     Note: This patient has been tested and was negative for the novel coronavirus COVID-19 at the time of admission on 9/15.    DVT prophylaxis:  SCDs Code Status:  DNR - confirmed with family Family Communication: Wife present throughout initial evaluation but was not present on follow-up evaluation Disposition Plan:  The patient is from: home  Anticipated d/c is to: be determined  Anticipated d/c date will depend on clinical response to treatment  Patient is currently: acutely ill Consults called: GI; nephrology; IR; palliative care Admission status: Admit - It is my clinical opinion that admission to INPATIENT is reasonable and necessary because of the expectation that this patient will require hospital care that crosses at least 2 midnights to treat this condition based on the medical complexity of the problems presented.  Given the aforementioned information, the predictability of an adverse outcome is felt to be significant.     Karmen Bongo MD [facility]   How to contact the [facility] Attending or Consulting provider [place] or covering provider during after hours [place], for this patient?  1. Check the care team in [facility] and look for a) attending/consulting [facility] provider listed and b) the [facility] team listed 2. Log into [email or web address] and use [place]'s universal password to access. If you do not have the password, please contact the hospital operator. 3. Locate the [facility] provider you are looking for under [facility] and page to a number that you can be directly reached. 4. If you still have difficulty reaching the provider, please page the [facility] (Director on Call) for the Hospitalists listed on amion for assistance.   11/02/2019, 6:02 PM

## 2019-10-07 NOTE — Progress Notes (Signed)
  T bili slightly up today despite drain placements and copious drainage ( >1100 mL)  Repeat CMP tomorrow.  NPO after Midnight and will evaluate again in am. Will plan for exchange either tomorrow or Wednesday with brush biopsy.  Lakeem Rozo S Garnetta Fedrick PA-C 10/22/2019 4:30 PM

## 2019-10-07 NOTE — Progress Notes (Addendum)
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 Is somnolent with reported acute mental status changes today. Transferred to 2W. CT head pending. Bili and WBC up    Objective   Vital signs in last 24 hours: Temp:  [97.8 F (36.6 C)-98.4 F (36.9 C)] 98.4 F (36.9 C) (10/04 1301) Pulse Rate:  [94-120] 120 (10/04 1400) Resp:  [16-19] 19 (10/04 1400) BP: (99-118)/(55-90) 110/74 (10/04 1400) SpO2:  [90 %-95 %] 94 % (10/04 1400) Weight:  [130.6 kg] 130.6 kg (10/04 0359)   General:    AA male sleeping in bed, not responsive to questioning Abdomen:  Soft, nontender and protuberant with biliary drains in place.   Intake/Output from previous day: No intake/output data recorded. Intake/Output this shift: No intake/output data recorded.  Lab Results: Recent Labs    10/05/19 0651 10/06/19 0539 10/18/2019 0713  WBC 9.2 12.2* 16.5*  HGB 13.0 13.8 14.7  HCT 37.8* 40.2 44.1  PLT 47* 50* 52*   BMET Recent Labs    10/05/19 0651 10/06/19 0539 10/29/2019 0713  NA 142 146* 150*  K 3.1* 3.5 4.3  CL 94* 99 103  CO2 34* 36* 31  GLUCOSE 116* 126* 78  BUN 73* 66* 70*  CREATININE 2.74* 2.68* 3.13*  CALCIUM 7.7* 8.0* 7.6*   LFT Recent Labs    11/01/2019 0713  PROT 6.2*  ALBUMIN 1.6*  AST 175*  ALT 112*  ALKPHOS 116  BILITOT 8.0*   PT/INR Recent Labs    10/06/19 0539  LABPROT 16.9*  INR 1.4*    Studies/Results: IR PERCUTANEOUS TRANSHEPATIC CHOLANGIOGRAM  Result Date: 10/06/2019 INDICATION: 74 year old male with central hilar tumor, obstructing biliary system. He has been referred for biliary drainage EXAM: IMAGE GUIDED PLACEMENT OF RIGHT-SIDED INTERNAL/EXTERNAL BILIARY DRAIN ULTRASOUND-GUIDED PLACEMENT OF LEFT HEPATIC EXTERNALIZED BILIARY DRAIN MEDICATIONS: None ANESTHESIA/SEDATION: Moderate (conscious) sedation was employed during this procedure. A total of Versed 3.5 mg and Fentanyl 150 mcg was administered  intravenously. Moderate Sedation Time: 35 minutes. The patient's level of consciousness and vital signs were monitored continuously by radiology nursing throughout the procedure under my direct supervision. FLUOROSCOPY TIME:  Fluoroscopy Time: 8 minutes 12 seconds (88 mGy). COMPLICATIONS: None PROCEDURE: The procedure, risks, benefits, and alternatives were explained to the patient and the patient's family. A complete informed consent was performed, with risk benefit analysis. Specific risks that were discussed for the procedure include bleeding, infection, biliary sepsis, IC use day, organ injury, need for further procedure, need for further surgery, long-term drain placement, cardiopulmonary collapse, death. Questions regarding the procedure were encouraged and answered. The patient understands and consents to the procedure. Patient is position in supine position on the fluoroscopy table, and the upper abdomen was prepped and draped in the usual sterile fashion. Maximum barrier sterile technique with sterile gowns and gloves were used for the procedure. A timeout was performed prior to the initiation of the procedure. Local anesthesia was provided with 1% lidocaine with epinephrine. Mid axillary line was identified, and a hemostat and ultrasound imaging was used to identified a reasonable rib space for the first approach into the right biliary system. 1% lidocaine was used for local anesthesia, with generous infiltration of the skin and subcutaneous tissues in and intercostal location. A Chiba needle was advanced under ultrasound guidance into the right liver lobe. Two passes of the Chiba needle were required to enter the biliary system. Once the tip of the needle was  confirmed within the biliary system by injecting small aliquots of contrast, images were stored of the biliary system after partially opacifying the biliary tree via the needle. Once the tip of this needle was confirmed within the biliary system, an  018 wire was advanced into the obstructed biliary duct. The needle was removed, a small incision was made with an 11 blade scalpel, and then a triaxial Envy system was advanced into the biliary system. Once the system was in the occluded duct, the metal stiffener and the inner introducer were removed over the wire. A check flow valve was advanced over the wire to the back end of the 4 French outer sheath. Contrast was used to opacify the obstructed duct in multiple obliquity. We were then successful within dancing the 018 wire into the extrahepatic biliary system. The inner dilator and the stiffener were advanced over the wire after removing the check flow valve, and the entire and the system was advanced into the extrahepatic biliary duct. Contrast was injected through the 4 [place] system to confirm location. A Glidewire and a 4 [place] glide catheter used to navigate the Glidewire into the duodenum. Once the catheter was within the duodenum and the wire was removed, a 180 cm Amplatz wire was placed into the duodenum. At this point we turned our attention to the left biliary. Ultrasound was used to identify a safe approach to the left-sided biliary system. 1% lidocaine was used for local anesthesia. Using ultrasound guidance, Chiba needle was used to access a biliary duct. Once we confirmed with ultrasound images the tip within the duct, contrast was injected confirming location within an enlarged left-sided duct. The 018 wire was then passed into the obstructed duct. A second Envy triaxial system was then advanced on the wire into the obstructed duct. The inter metal stiffener and the introducer were removed. Check flow valve was passed on the back of the 4 [place] system over the wire. Contrast was injected confirming we were within the duct. The 018 wire was then passed to the patient's right, and entered the right-sided occluded ductal system. We then reassembled the triaxial system and past the entire system  through the central tumor to the right-sided occluded ducts. The wire and inner stiffener/dilator assembly were removed. Contrast was injected confirming location within the right-sided duct. A new short 45 cm Amplatz wire was passed through the catheter. Ten French dilation was performed. We then placed a 10 French pigtail externalized drain in a straight configuration from the occluded right-sided ducts, across the occlusive tumor in the hepatic hilum, and into the left-sided ducts. Contrast injected confirmed location of the proximal side holes within the left-sided system. Finally, we performed 10 French dilation of the right system with a 10 [place] dilator and then a 20 cm 10 French introducer from a sheath system, with dilation as central as possible. A 10.2 French internal/external biliary drain was then advanced through the occlusive tumor using the metal stiffener. Once the drain was within the duodenum the inner stiffener and the wire were removed. Contrast injected confirmed location of the 2 separate drains. Both drainage catheters were sutured in position and attached to gravity drainage. A culture was sent to the lab. Final image was stored. The patient tolerated the procedure well and remained hemodynamically stable throughout. No complications were encountered and no significant blood loss was encountered. IMPRESSION: Status post image guided placement of a right percutaneous transhepatic internal/external biliary drain, which terminates in the duodenum, as well as ultrasound-guided  placement of an externalized left-sided percutaneous transhepatic biliary drain. Signed, Dulcy Fanny. Dellia Nims, RPVI [facility] Radiology PLAN: Close follow-up will be required as the drains frequently occlude after the initial placement. Short interval follow-up may be attempted for internalization of the left-sided externalized biliary drain. Electronically Signed   By:  Corrie Mckusick D.O.   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 He has a ruptured globe of the left eye, unable to lie on his side for ERCP. He had biliary decompression with 2 drains placed per IR yesterday. Unfortunately bili rose from 5.7 to 8.0 today along with mild rise of AST and ALT. He has developed acute mental status changes, worsening AKI, leukocytosis with mild tachycardia. Unclear what has precipitated this. Spoke with Dr. Lorin Mercy of primary service. Head CT pending. With rising LAEs post biliary drainage, would touch base with IR about having drains interrogated, sounds like there is plan for exchange of drains in near future (tomorrow?) with plans for biopsy of the tumor per IR. Biopsy needed to confirm dx so Oncology can have discussion about options for treatment. Otherwise in light of history of cirrhosis can start lactulose empirically in case he has hepatic encephalopathy (he will need NG to give this or give enemas as well). Would add empiric antibiotics with rise in WBC, without clear source otherwise in light of rise of bili, and await CT head. Appreciate nephrology assistance regarding management of hypernatremia and AKI.  Will follow, call with questions.   Woodland Cellar, MD [facility] Gastroenterology

## 2019-10-07 NOTE — Progress Notes (Signed)
[facility] PHYSICAL MEDICINE & REHABILITATION PROGRESS NOTE   Subjective/Complaints: Patient seen laying in bed this morning.  Wife at bedside.  Patient moaning.  Therapies and wife concerned.  Discussed with therapies as well as wife plans for transfer.  Discussed with covering physician as well regarding tachycardia.  Wife appreciative of care.  Patient had IR drain placement x2 yesterday.  Unable to obtain biopsy due to lack of pathology on weekends.  ROS: Unable to assess due to cognition/lethargy.  Objective:    IR PERCUTANEOUS TRANSHEPATIC CHOLANGIOGRAM  Result Date: 10/06/2019 INDICATION: 74 year old male with central hilar tumor, obstructing biliary system. He has been referred for biliary drainage EXAM: IMAGE GUIDED PLACEMENT OF RIGHT-SIDED INTERNAL/EXTERNAL BILIARY DRAIN ULTRASOUND-GUIDED PLACEMENT OF LEFT HEPATIC EXTERNALIZED BILIARY DRAIN MEDICATIONS: None ANESTHESIA/SEDATION: Moderate (conscious) sedation was employed during this procedure. A total of Versed 3.5 mg and Fentanyl 150 mcg was administered intravenously. Moderate Sedation Time: 35 minutes. The patient's level of consciousness and vital signs were monitored continuously by radiology nursing throughout the procedure under my direct supervision. FLUOROSCOPY TIME:  Fluoroscopy Time: 8 minutes 12 seconds (88 mGy). COMPLICATIONS: None PROCEDURE: The procedure, risks, benefits, and alternatives were explained to the patient and the patient's family. A complete informed consent was performed, with risk benefit analysis. Specific risks that were discussed for the procedure include bleeding, infection, biliary sepsis, IC use day, organ injury, need for further procedure, need for further surgery, long-term drain placement, cardiopulmonary collapse, death. Questions regarding the procedure were encouraged and answered. The patient understands and consents to the procedure. Patient is position in supine position on the fluoroscopy table,  and the upper abdomen was prepped and draped in the usual sterile fashion. Maximum barrier sterile technique with sterile gowns and gloves were used for the procedure. A timeout was performed prior to the initiation of the procedure. Local anesthesia was provided with 1% lidocaine with epinephrine. Mid axillary line was identified, and a hemostat and ultrasound imaging was used to identified a reasonable rib space for the first approach into the right biliary system. 1% lidocaine was used for local anesthesia, with generous infiltration of the skin and subcutaneous tissues in and intercostal location. A Chiba needle was advanced under ultrasound guidance into the right liver lobe. Two passes of the Chiba needle were required to enter the biliary system. Once the tip of the needle was confirmed within the biliary system by injecting small aliquots of contrast, images were stored of the biliary system after partially opacifying the biliary tree via the needle. Once the tip of this needle was confirmed within the biliary system, an 018 wire was advanced into the obstructed biliary duct. The needle was removed, a small incision was made with an 11 blade scalpel, and then a triaxial Envy system was advanced into the biliary system. Once the system was in the occluded duct, the metal stiffener and the inner introducer were removed over the wire. A check flow valve was advanced over the wire to the back end of the 4 French outer sheath. Contrast was used to opacify the obstructed duct in multiple obliquity. We were then successful within dancing the 018 wire into the extrahepatic biliary system. The inner dilator and the stiffener were advanced over the wire after removing the check flow valve, and the entire and the system was advanced into the extrahepatic biliary duct. Contrast was injected through the 4 [place] system to confirm location. A Glidewire and a 4 [place] glide catheter used to navigate the [employer] into  the  duodenum. Once the catheter was within the duodenum and the wire was removed, a 180 cm Amplatz wire was placed into the duodenum. At this point we turned our attention to the left biliary. Ultrasound was used to identify a safe approach to the left-sided biliary system. 1% lidocaine was used for local anesthesia. Using ultrasound guidance, Chiba needle was used to access a biliary duct. Once we confirmed with ultrasound images the tip within the duct, contrast was injected confirming location within an enlarged left-sided duct. The 018 wire was then passed into the obstructed duct. A second Envy triaxial system was then advanced on the wire into the obstructed duct. The inter metal stiffener and the introducer were removed. Check flow valve was passed on the back of the 4 [place] system over the wire. Contrast was injected confirming we were within the duct. The 018 wire was then passed to the patient's right, and entered the right-sided occluded ductal system. We then reassembled the triaxial system and past the entire system through the central tumor to the right-sided occluded ducts. The wire and inner stiffener/dilator assembly were removed. Contrast was injected confirming location within the right-sided duct. A new short 45 cm Amplatz wire was passed through the catheter. Ten French dilation was performed. We then placed a 10 French pigtail externalized drain in a straight configuration from the occluded right-sided ducts, across the occlusive tumor in the hepatic hilum, and into the left-sided ducts. Contrast injected confirmed location of the proximal side holes within the left-sided system. Finally, we performed 10 French dilation of the right system with a 10 [place] dilator and then a 20 cm 10 French introducer from a sheath system, with dilation as central as possible. A 10.2 French internal/external biliary drain was then advanced through the occlusive tumor using the metal stiffener. Once the drain was  within the duodenum the inner stiffener and the wire were removed. Contrast injected confirmed location of the 2 separate drains. Both drainage catheters were sutured in position and attached to gravity drainage. A culture was sent to the lab. Final image was stored. The patient tolerated the procedure well and remained hemodynamically stable throughout. No complications were encountered and no significant blood loss was encountered. IMPRESSION: Status post image guided placement of a right percutaneous transhepatic internal/external biliary drain, which terminates in the duodenum, as well as ultrasound-guided placement of an externalized left-sided percutaneous transhepatic biliary drain. Signed, Dulcy Fanny. Dellia Nims, RPVI [facility] Radiology PLAN: Close follow-up will be required as the drains frequently occlude after the initial placement. Short interval follow-up may be attempted for internalization of the left-sided externalized biliary drain. Electronically Signed   By: Corrie Mckusick D.O.   On: 10/06/2019 10:55   Recent Labs    10/06/19 0539 10/16/2019 0713  WBC 12.2* 16.5*  HGB 13.8 14.7  HCT 40.2 44.1  PLT 50* 52*   Recent Labs    10/06/19 0539 10/24/2019 0713  NA 146* 150*  K 3.5 4.3  CL 99 103  CO2 36* 31  GLUCOSE 126* 78  BUN 66* 70*  CREATININE 2.68* 3.13*  CALCIUM 8.0* 7.6*    Intake/Output Summary (Last 24 hours) at 10/24/2019 0932 Last data filed at 10/31/2019 0351 Gross per 24 hour  Intake 60 ml  Output 2900 ml  Net -2840 ml        Physical Exam: Vital Signs Blood pressure 111/90, pulse (!) 109, temperature 98.2 F (36.8 C), resp. rate 16, weight  130.6 kg, SpO2 94 %. Constitutional: No distress . Vital signs reviewed. HENT: Normocephalic.  Atraumatic. Eyes: Left eye with edema and tenderness Cardiovascular: No JVD.  RRR.  + Murmur. Respiratory: Normal effort.  No stridor.  Bilateral clear to auscultation. GI:  Non-distended.  BS +. Skin: Vascular changes bilateral lower extremities. Sacral ulcer not examined today Psych: Unable to assess due to lethargy. Musc: Generalized edema. Left periorbital edema Neuro: Lethargic, moaning Previously: Motor: RUE/RLE: 3+/5 proximal distal LUE: Limited due to significant tone/rigidity, appears to be 4/5 within available range of motion LLE: Hip flexion, knee extension 1/5, ankle dorsiflexion 3/5  Assessment/Plan: 1. Functional deficits secondary to Parkinson's, with recent anterior left globe rupture which require 3+ hours per day of interdisciplinary therapy in a comprehensive inpatient rehab setting.  Physiatrist is providing close team supervision and 24 hour management of active medical problems listed below.  Physiatrist and rehab team continue to assess barriers to discharge/monitor patient progress toward functional and medical goals  Care Tool:  Bathing    Body parts bathed by patient: Right arm, Left arm, Chest, Abdomen, Face   Body parts bathed by helper: Right arm, Left arm, Chest, Abdomen, Front perineal area, Buttocks, Right upper leg, Left upper leg, Right lower leg, Left lower leg, Face     Bathing assist Assist Level: 2 Helpers     Upper Body Dressing/Undressing Upper body dressing   What is the patient wearing?: Hospital gown only    Upper body assist Assist Level: Maximal Assistance - Patient 25 - 49%    Lower Body Dressing/Undressing Lower body dressing      What is the patient wearing?: Incontinence brief     Lower body assist Assist for lower body dressing: 2 Helpers     Toileting Toileting Toileting Activity did not occur (Clothing management and hygiene only):  (not performed at time of eval, has Foley for urine and no need for BM at time of eval)  Toileting assist Assist for toileting: Dependent - Patient 0%     Transfers Chair/bed transfer  Transfers assist     Chair/bed transfer assist level: 2 Helpers      Locomotion Ambulation   Ambulation assist   Ambulation activity did not occur: Safety/medical concerns (fatigue/weakness)          Walk 10 feet activity   Assist  Walk 10 feet activity did not occur: Safety/medical concerns (fatigue/weakness)        Walk 50 feet activity   Assist Walk 50 feet with 2 turns activity did not occur: Safety/medical concerns (fatigue/weakness)         Walk 150 feet activity   Assist Walk 150 feet activity did not occur: Safety/medical concerns (fatigue/weakness)         Walk 10 feet on uneven surface  activity   Assist Walk 10 feet on uneven surfaces activity did not occur: Safety/medical concerns (fatigue/weakness)         Wheelchair     Assist Will patient use wheelchair at discharge?: Yes Type of Wheelchair: Manual    Wheelchair assist level: Dependent - Patient 0% (tilt in space wc does not allow for patient to propel himself)      Wheelchair 50 feet with 2 turns activity    Assist        Assist Level: Dependent - Patient 0% (tilt in space wc does not allow for patient to propel himself)   Wheelchair 150 feet activity     Assist  Assist Level: Maximal Assistance - Patient 25 - 49%   Medical Problem List and Plan: 1.Functional deficitssecondary to Parkinson's Gait disorder. Recent fall with rupture of anterior left globe.  Will discuss with hospitalist, plan to transfer to acute floor given lethargy, further work-up, tachycardia, electrolytes, leukocytosis, AKI and lack of participation and therapies 2. Antithrombotics: -DVT/anticoagulation:Mechanical:Antiembolism stockings, knee (TED hose) Bilateral lower extremities Sequential compression devices, below kneeBilateral lower extremities -antiplatelet therapy: n/a 3.Abdominal pain/Pain Management:Continue oxycodoneprn  Appears to be controlled with meds on 10/4  Monitor with increased exertion 4. Mood:team to  provide ego support -antipsychotic agents: n/a 5. Neuropsych: This patient is not capable of making decisions on his own behalf. 6. Skin/Wound Care:local care as indicated 7. Fluids/Electrolytes/Nutrition:encourage appropriate po 8. Ruptured globe left eye s/p surgical repair 9/16 by ophthalmology Dr. Eulas Post -eye shield at all times -broad spectrum coverage transitioned tolevofloxacin and doxycycline, completed on 9/30 -continueeye gttsfor 2 additional weeks(~10/12) -outpt f/u with Dr. Eulas Post after discharge 9. HTN: -pt on lisinopril, amlodipine, propranolol at home -Coreg started on 9/27   Slightly elevated on 10/4  Moderate increased mobility  10. BBC:[id number] Mirabegron ER and finasteride resumed  Creatinine 3.13 on 10/4, trending up  Continue to monitor 11.Parkinson's Disease Sinemet added recently but not started per notes. Pt followed by Dr. Tomi Likens  Will consider initiation after completion of GI work-up, however appears unlikely at this point 12. Hyponatremia, mild SIADH: Resolved 13. Prolonged QT interval: Avoid rate prolongation meds  ECG reviewed, showing persistent prolonged QTC 14. Constipation Senna-s two tabs at HS  Adjust bowel meds as necessary  Has not eaten anything 15.Distended abdomen HIDA scan negative.Some chronic findings due to abnl small bowel motility   LFTs trending up on 10/4  See #14 16.  Hyperkalemia  Potassium 4.3 on 10/4, continue labs  [place], may need to resume  Continue to monitor 17.  Hypoalbuminemia  Supplement initiated on 9/28 18.  Spasticity versus rigidity  See #11  Previously, thought was to consider neck MRI, however given recent findings this will not be indicated at this time 19.  Dysphagia  Continue D2 thins resumed on 9/30, once safe to swallow 20.   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   Over the weekend, attending physician paged medicine for telemetry monitoring but did not hear back.   Drains placed x2 by VIR on 10/3, unable to perform biopsy.  Plans for drain exchange early this week with biopsy at that time. 22.  Thrombocytopenia  Platelets 52 on 10/4 23.  Hypernatremia  Sodium 150 on 10/4  Continue to monitor  > 30 minutes spent in total in discharge planning to acute between myself and PA regarding aforementioned with wife and accepting physician  LOS: 7 days A FACE TO [place] 11/02/2019, 9:32 AM

## 2019-10-07 NOTE — Progress Notes (Signed)
Pharmacy Antibiotic Note  Nicholas Ramirez is a 74 y.o. male admitted on 11/02/2019 from CIR with AMS. Pt has biliary drain so concern for abdominal infection. Pharmacy has been consulted for Unasyn dosing. Cr worsening today up to 3.13 mg/dl.  Plan: Unasyn 3g IV q12h Follow Cr, cultures     Temp (24hrs), Avg:98.1 F (36.7 C), Min:97.8 F (36.6 C), Max:98.4 F (36.9 C)  Recent Labs  Lab 10/03/19 2002 10/04/19 1118 10/05/19 0651 10/06/19 0539 10/24/2019 0713  WBC 10.2 12.8* 9.2 12.2* 16.5*  CREATININE 2.85* 2.71* 2.74* 2.68* 3.13*    Estimated Creatinine Clearance: 28.2 mL/min (A) (by C-G formula based on SCr of 3.13 mg/dL (H)).    Allergies  Allergen Reactions   Oxybutynin Other (See Comments)    cramps   Sertraline Itching   Tadalafil Rash    Arrie Senate, PharmD, BCPS Clinical Pharmacist [phone] Please check AMION for all [place] numbers 10/29/2019

## 2019-10-07 NOTE — Progress Notes (Signed)
Occupational Therapy Note  Patient Details  Name: Nicholas Ramirez MRN: [id number] Date of Birth: December 09, 1945  Today's Date: 10/13/2019 OT Missed Time: 3 Minutes Missed Time Reason: MD hold (comment) (arranging transfer to acute)  Algis Liming, PA, notifies therapist of impending transfer to acute care due to medical issues.  Pt somnolent with no awareness of therapist presence.  Pt's wife appreciative of all therapy's care and is hopeful for his return to CIR post medical intervention.   Pt missed 60 mins due to verbal order by PA to hold therapies in preparation to transfer to acute.  Simonne Come 10/06/2019, 11:14 AM

## 2019-10-07 NOTE — Progress Notes (Signed)
Inpatient Rehabilitation Care Coordinator  Discharge Note  The overall goal for the admission was met for:   Discharge location: [place]  Length of Stay: No-7 DAYS  Discharge activity level: No  Home/community participation: No  Services provided included: MD, RD, PT, OT, SLP, RN, CM, Pharmacy and [place]: Private Insurance: [employer]  Follow-up services arranged: Other: TRANSFERRING TO ACUTE DUE TO MEDICAL ISSUES  Comments (or additional information):MESSAGE LEFT FOR [employer] TO INFORM OF TRANSFER TO ACUTE  Patient/Family verbalized understanding of follow-up arrangements: Yes  Individual responsible for coordination of the follow-up plan: ANNA-WIFE  Confirmed correct DME delivered: Elease Hashimoto 10/29/2019    Elease Hashimoto

## 2019-10-07 NOTE — Plan of Care (Signed)
Patient transfer to acute due to change in condition.

## 2019-10-07 NOTE — Progress Notes (Signed)
Physical Therapy Session Note  Patient Details  Name: Nicholas Ramirez MRN: [id number] Date of Birth: [birth year]  Today's Date: 10/04/2019 PT Individual Time: 0800-0835 PT Individual Time Calculation (min): 35 min  Today's Date: 10/29/2019 PT Missed Time: 49 Minutes Missed Time Reason: Patient fatigue  Short Term Goals: Week 1:  PT Short Term Goal 1 (Week 1): Patient will remain sitting with R UE support for >2 mins with no more than MinA x1 PT Short Term Goal 2 (Week 1): Patient will transfer bed<> wc with no more than ModA x2 PT Short Term Goal 3 (Week 1): Patient will be able to complete bed mobility with MaxA x1  Skilled Therapeutic Interventions/Progress Updates:   Treatment Session 1: [phone] 35 min Received pt supine in bed asleep with NT present assessing vitals and pt's wife, Vicente Males, present at bedside. Pt with eyes closed, unable to keep eyes open despite encouragement, and unable to engage in conversation with therapist, but with continued moaning. Per RN, pt has presented this way since his procedure yesterday. Pt's wife tearful and emotional during session asking numerous questions regarding pt's current medical status. Encouraged Vicente Males to speak with MD regarding concerns during morning rounds (this therapist later notified MD of pt's status and wife's concerns). RN present to attempt to administer morning medications however due to lethargy was unable to do so. Noted continued swelling in L LE and weeping of bilateral LEs. Donned bilateral ace wraps total A for edema management and to protect weeping areas and donned bilateral non-skid socks total A. Pt's wife reported desire to stay here all day at bedside to make sure pt was okay. This therapist set up recliner to make pt's wife comfortable and provided emotional support and encouragement as needed as pt's wife very concerned with pt's status. Therapist assisted with repositioning L UE on pillow, achieving neutral position in bed as  pt leaning L, and elevating bilateral LEs on pillows for pressure relief purposes. Concluded session with pt supine in bed, needs within reach, and wife present at bedside. 40 minutes missed of skilled physical therapy due to fatigue.   Treatment Session 2:  Pt now transferred to acute due to medical complications.  Therapy Documentation Precautions:  Precautions Precautions: Fall, Other (comment) (L eye shield all times) Precaution Comments: pt admitted for a fall, daughter reports 15-20 in last 6 months Restrictions Weight Bearing Restrictions: No   Therapy/Group: Individual Therapy Alfonse Alpers PT, DPT   10/22/2019, 7:22 AM

## 2019-10-07 NOTE — Plan of Care (Signed)
  Problem: Safety: Goal: Ability to remain free from injury will improve Outcome: Progressing   

## 2019-10-07 NOTE — Progress Notes (Signed)
Patient ID: Nicholas Ramirez, male   DOB: 10/16/1945, 74 y.o.   MRN: [id number]  Met with wife who is in the room to discuss pt's medical issues and probability of transferring to acute today. She also has concerns regarding their granddaughter's and plans on calling CPS. Gave here the number in [place] to proceed with this. Wife showed this worker the numerous hateful text from her daughter she received over the weekend and blaming her for pt's condition amoungst other issues. Wife reports some of these issues are longstanding with her daughter. Provided support to wife. Pam-PA in room updating wife on medical issues.

## 2019-10-07 NOTE — Progress Notes (Signed)
Patient ID: Yoniel Arkwright, male   DOB: 25-Sep-1945, 74 y.o.   MRN: [id number] [facility] Progress Note   Assessment/ Plan:   1. Acute kidney Injury: Baseline normal, developed AKI thought to be secondary to ATN which was slowly improving, however in the setting of clinical worsening Cr noted to rise 2.7 > 3.1 today.  He appears to be intravascularly volume depleted and noted he was thirsty.  I will give IV albumin for support today. 2.  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  Had drain placed over weekend but needs biopsy still to confirm diagnosis. 3.  Hypertension: Blood pressures lower today, remains on carvedilol 4.  Ruptured globe left eye: Secondary to mechanical fall/trauma and status post surgical repair.  On antibiotics-levofloxacin and doxycycline. 5.  Recurrent falls/recent diagnosis of Parkinson disease.  Admitted to CIR for ongoing rehabilitation efforts. 6.  Anemia: Possibly associated with acute illness versus newly uncovered malignancy.  Iron stores adequate (iron saturation 69% with ferritin 768). 7.  Hypernatremia:  Secondary to post ATN osmotic diuresis + impaired ability to replace free water with AMS.  D5W low rate x 12h.    Subjective:   Clinical worsening - transferring from CIR to acute care.   Wife at bedside and provides history as he's lethargic.  She says he did tell her he was thirsty.   Objective:   BP 113/67 (BP Location: Left Arm)   Pulse (!) 115   Temp 98.1 F (36.7 C)   Resp 17   Wt 130.6 kg   SpO2 93%   BMI 42.53 kg/m   Intake/Output Summary (Last 24 hours) at 10/25/2019 1233 Last data filed at 10/20/2019  1145 Gross per 24 hour  Intake 60 ml  Output 3345 ml  Net -3285 ml   Weight change: 35.8 kg  Physical Exam: Gen: lying in bed moaning, wife at bedside. ENT: MM dry, L eye protective gauze CVS: Pulse regular rhythm, normal rate, S1 and S2 normal Resp: Poor inspiratory effort with decreased breath sounds over bases, no distinct rales Abd: Soft, obese, nontender, drain noted Ext: 2+ lower extremity edema, lower extremities in wraps.  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 He has been referred for biliary drainage EXAM: IMAGE GUIDED PLACEMENT OF RIGHT-SIDED INTERNAL/EXTERNAL BILIARY DRAIN ULTRASOUND-GUIDED PLACEMENT OF LEFT HEPATIC EXTERNALIZED BILIARY DRAIN MEDICATIONS: None ANESTHESIA/SEDATION: Moderate (conscious) sedation was employed during this procedure. A total of Versed 3.5 mg and Fentanyl 150 mcg was administered intravenously. Moderate Sedation Time: 35 minutes. The patient's level of consciousness and vital signs were monitored continuously by radiology nursing throughout the procedure under my direct supervision. FLUOROSCOPY TIME:  Fluoroscopy Time: 8 minutes 12 seconds (88 mGy). COMPLICATIONS: None PROCEDURE: The procedure, risks, benefits, and alternatives were explained to the patient and the patient's family. A complete informed consent was performed, with risk benefit analysis. Specific risks that were discussed for the procedure include bleeding, infection, biliary sepsis, IC use day, organ injury, need for further procedure, need for further surgery, long-term drain placement, cardiopulmonary collapse, death. Questions regarding the procedure were encouraged and answered. The patient understands and consents to the procedure. Patient is position in supine position on the fluoroscopy  table, and the upper abdomen was prepped and draped in the usual sterile fashion.  Maximum barrier sterile technique with sterile gowns and gloves were used for the procedure. A timeout was performed prior to the initiation of the procedure. Local anesthesia was provided with 1% lidocaine with epinephrine. Mid axillary line was identified, and a hemostat and ultrasound imaging was used to identified a reasonable rib space for the first approach into the right biliary system. 1% lidocaine was used for local anesthesia, with generous infiltration of the skin and subcutaneous tissues in and intercostal location. A Chiba needle was advanced under ultrasound guidance into the right liver lobe. Two passes of the Chiba needle were required to enter the biliary system. Once the tip of the needle was confirmed within the biliary system by injecting small aliquots of contrast, images were stored of the biliary system after partially opacifying the biliary tree via the needle. Once the tip of this needle was confirmed within the biliary system, an 018 wire was advanced into the obstructed biliary duct. The needle was removed, a small incision was made with an 11 blade scalpel, and then a triaxial Envy system was advanced into the biliary system. Once the system was in the occluded duct, the metal stiffener and the inner introducer were removed over the wire. A check flow valve was advanced over the wire to the back end of the 4 French outer sheath. Contrast was used to opacify the obstructed duct in multiple obliquity. We were then successful within dancing the 018 wire into the extrahepatic biliary system. The inner dilator and the stiffener were advanced over the wire after removing the check flow valve, and the entire and the system was advanced into the extrahepatic biliary duct. Contrast was injected through the 4 [place] system to confirm location. A Glidewire and a 4 [place] glide catheter used to navigate the Glidewire into the duodenum. Once the catheter was within the duodenum and the wire was  removed, a 180 cm Amplatz wire was placed into the duodenum. At this point we turned our attention to the left biliary. Ultrasound was used to identify a safe approach to the left-sided biliary system. 1% lidocaine was used for local anesthesia. Using ultrasound guidance, Chiba needle was used to access a biliary duct. Once we confirmed with ultrasound images the tip within the duct, contrast was injected confirming location within an enlarged left-sided duct. The 018 wire was then passed into the obstructed duct. A second Envy triaxial system was then advanced on the wire into the obstructed duct. The inter metal stiffener and the introducer were removed. Check flow valve was passed on the back of the 4 [place] system over the wire. Contrast was injected confirming we were within the duct. The 018 wire was then passed to the patient's right, and entered the right-sided occluded ductal system. We then reassembled the triaxial system and past the entire system through the central tumor to the right-sided occluded ducts. The wire and inner stiffener/dilator assembly were removed. Contrast was injected confirming location within the right-sided duct. A new short 45 cm Amplatz wire was passed through the catheter. Ten French dilation was performed. We then placed a 10 French pigtail externalized drain in a straight configuration from the occluded right-sided ducts, across the occlusive tumor in the hepatic hilum, and into the left-sided ducts. Contrast injected confirmed location of the proximal side holes within the left-sided system. Finally, we performed 10 French dilation of the right system with a 10  [place] dilator and then a 20 cm 10 French introducer from a sheath system, with dilation as central as possible. A 10.2 French internal/external biliary drain was then advanced through the occlusive tumor using the metal stiffener. Once the drain was within the duodenum the inner stiffener and the wire were removed.  Contrast injected confirmed location of the 2 separate drains. Both drainage catheters were sutured in position and attached to gravity drainage. A culture was sent to the lab. Final image was stored. The patient tolerated the procedure well and remained hemodynamically stable throughout. No complications were encountered and no significant blood loss was encountered. IMPRESSION: Status post image guided placement of a right percutaneous transhepatic internal/external biliary drain, which terminates in the duodenum, as well as ultrasound-guided placement of an externalized left-sided percutaneous transhepatic biliary drain. Signed, Dulcy Fanny. Dellia Nims, RPVI [facility] PLAN: Close follow-up will be required as the drains frequently occlude after the initial placement. Short interval follow-up may be attempted for internalization of the left-sided externalized biliary drain. Electronically Signed   By: Corrie Mckusick D.O.   On: 10/06/2019 10:55    Labs: BMET Recent Labs  Lab 10/01/19 0452 10/02/19 2104 10/03/19 2002 10/04/19 1118 10/05/19 0651 10/06/19 0539 10/29/2019 0713  NA 144 141 140 142 142 146* 150*  K 5.0 4.9 3.7 3.2* 3.1* 3.5 4.3  CL 108 106 96* 97* 94* 99 103  CO2 26 25 32 29 34* 36* 31  GLUCOSE 132* 116* 138* 142* 116* 126* 78  BUN 86* 79* 79* 79* 73* 66* 70*  CREATININE 2.98* 2.53* 2.85* 2.71* 2.74* 2.68* 3.13*  CALCIUM 8.5* 8.2* 8.1* 8.0* 7.7* 8.0* 7.6*  PHOS  --   --   --  5.0* 4.8* 4.4  --    CBC Recent Labs  Lab 10/04/19 1118 10/05/19 0651 10/06/19 0539 10/22/2019 0713  WBC 12.8* 9.2 12.2* 16.5*  NEUTROABS 10.0* 7.0 8.8* 13.6*  HGB 15.1 13.0 13.8 14.7  HCT 43.3 37.8* 40.2 44.1  MCV 100.2* 101.1* 102.8* 102.6*  PLT 57* 47* 50* 52*    Medications:    . carvedilol  3.125 mg Oral BID WC  . Chlorhexidine Gluconate Cloth  6 each Topical BID  . DULoxetine  60 mg Oral Daily  . finasteride  5 mg Oral Daily  .  gatifloxacin  1 drop Left Eye QID  . loratadine  10 mg Oral Daily  . mirabegron ER  50 mg Oral Daily  . neomycin-polymyxin b-dexamethasone  1 application Left Eye QID  . nystatin  5 mL Oral QID  . prednisoLONE acetate  1 drop Left Eye QID  . senna-docusate  1 tablet Oral BID   Jannifer Hick MD [facility] Pager [phone]

## 2019-10-07 NOTE — Discharge Summary (Addendum)
Physician Discharge Summary  Patient ID: Bach Rocchi MRN: [id number] DOB/AGE: July 24, 1945 74 y.o.  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 COMPARISON:  HIDA scan 09/30/2019, ultrasound 09/27/2019 FINDINGS: Lower chest: Lung bases are clear. Hepatobiliary: There is intrahepatic biliary duct dilatation with the LEFT and RIGHT hepatic lobe. There is hypoattenuation liver parenchyma at the confluence of the LEFT and RIGHT bile duct systems (image 29/3). No clear common bile duct dilatation although the common bile duct difficult to follow on this noncontrast exam. Gallbladder is normal diameter at 2.2 cm. Large volume intraperitoneal free fluid surrounding the liver. This fluid is simple fluid attenuation. Pancreas: No pancreatic duct dilatation. No pancreatic inflammation. Spleen: Spleen is normal volume. Adrenals/urinary tract: Adrenal glands and kidneys are normal. The ureters and bladder normal. Bladder collapsed around Foley catheter. The Stomach/Bowel: Stomach, duodenum small-bowel normal. Terminal ileum is normal. Appendix normal. Moderate volume stool in the ascending transverse colon. Rectosigmoid colon normal.  Vascular/Lymphatic: Abdominal aorta is normal caliber with atherosclerotic calcification. There is no retroperitoneal or periportal lymphadenopathy. No pelvic lymphadenopathy. Reproductive: Unremarkable Other: RIGHT volume of free fluid surrounds the spleen as well as the LEFT hepatic lobe collects in the pelvis. Musculoskeletal: No aggressive osseous lesion. IMPRESSION: 1. No evidence of bowel obstruction. 2. Intrahepatic biliary duct dilatation within LEFT and RIGHT hepatic lobe with central low attenuation surrounding the confluence of the ductal systems. Difficult interpretation with no IV contrast however concern for centrally obstructing lesion such as infiltrating cholangiocarcinoma. If patient cannot receive IV contrast a MRI without contrast may provide further characterization; however, MRIs on in-patients can be problematic due to difficulty breath holding on lung sequences. 3. The common bile duct is not dilated. The pancreas appears normal. 4. Large volume of intraperitoneal free fluid is favored related to hepatic dysfunction. Electronically Signed   By: Suzy Bouchard M.D.   On: 10/02/2019 20:03   DG Abd 1 View  Result Date: 10/01/2019 CLINICAL DATA:  Abdomen distension EXAM: ABDOMEN - 1 VIEW COMPARISON:  None. FINDINGS: Air distension of what appears to be cecum in the right mid abdomen up to 14.8 cm. Remainder of the gas pattern is unremarkable. No radiopaque calculi are visualized. IMPRESSION: Air distension of what appears to be cecum in the right mid abdomen up to 14.8 cm, raising possibility of cecal bascule/cecal obstruction. CT may be considered for further evaluation. These results will be called to the ordering clinician or representative by the Radiologist Assistant, and communication documented in the PACS or [employer]. Electronically Signed   By: Donavan Foil M.D.   On: 10/01/2019 16:25     NM Hepatobiliary Liver Func  Result Date: 09/30/2019 CLINICAL DATA:  Chronic  upper abdominal pain, gallbladder wall thickening on ultrasound, assess gallbladder motility EXAM:  NUCLEAR MEDICINE HEPATOBILIARY IMAGING TECHNIQUE: Sequential images of the abdomen were obtained out to 60 minutes following intravenous administration of radiopharmaceutical. Due to nonvisualization of the gallbladder, 3 mg of morphine was administered and imaging was continued for an additional 30 minutes. RADIOPHARMACEUTICALS:  5.3 mCi Tc-12m Choletec IV COMPARISON:  Ultrasound abdomen 09/27/2019 FINDINGS: Delayed clearance of tracer from bloodstream indicating impaired hepatocellular function. Abnormal appearance of liver with inferior position and discontiguous appearance of the lateral segment LEFT lobe versus remainder of liver, question hepatic anomaly. Significant retention of tracer within hepatic parenchyma as well as within dilated central biliary tree of the lateral segment LEFT lobe. Delayed visualization of CBD. No definite bowel activity is identified by the conclusion of the exam. Gallbladder is probably visualized just before morphine administration with increased tracer accumulation in probable gallbladder following morphine. 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 Visualization of central biliary radicles, probably gallbladder, and CBD. No small bowel activity is identified to confirm CBD patency; in light of the biliary dilatation identified by ultrasound, recommend assessment by MR imaging with MRCP imaging to evaluate. If patient is unable to undergo MR imaging, recommend CT assessment, with IV contrast if renal function permits. Electronically Signed   By: MLavonia DanaM.D.   On: 09/30/2019 14:25     MR ABDOMEN MRCP WO CONTRAST  Result Date: 10/04/2019 CLINICAL DATA:  Inpatient. Cirrhosis. Renal failure. Concern for central liver mass on recent noncontrast CT. EXAM: MRI ABDOMEN WITHOUT CONTRAST   (INCLUDING MRCP) TECHNIQUE: Multiplanar multisequence MR imaging of the abdomen was performed. Heavily T2-weighted images of the biliary and pancreatic ducts were obtained, and three-dimensional MRCP images were rendered by post processing. COMPARISON:  10/02/2019 CT abdomen/pelvis. FINDINGS: Scan is limited by motion degradation. Lower chest: No acute abnormality at the lung bases. Hepatobiliary: Diffusely irregular liver contour compatible with cirrhosis. No hepatic steatosis. There is diffuse marked intrahepatic biliary ductal dilatation with abrupt caliber transition at the level of biliary hilum near the central liver/porta hepatis, with normal caliber common bile duct (4 mm diameter). There is vague T2 hyperintensity and restricted diffusion in the central liver at the site of biliary obstruction, spanning approximately 7.5 x 6.8 cm in maximum axial dimensions (series 9/image 103). Several small simple appearing liver cysts, largest 1.5 cm peripherally in the right liver. Mildly distended gallbladder with mild diffuse gallbladder wall thickening. No cholelithiasis. No evidence of choledocholithiasis. Pancreas: No pancreatic mass or duct dilation.  No pancreas divisum. Spleen: Normal size. No mass. Adrenals/Urinary Tract: Normal adrenals. No hydronephrosis. Normal kidneys with no renal mass. Stomach/Bowel: Normal non-distended stomach. Visualized small and large bowel is normal caliber, with no bowel wall thickening. Vascular/Lymphatic: Normal caliber abdominal aorta. No pathologically enlarged lymph nodes in the abdomen. Other: Small to moderate volume ascites, predominantly perihepatic. No focal fluid collection. Musculoskeletal: No aggressive appearing focal osseous lesions. IMPRESSION: 1. Limited motion degraded noncontrast MRI. 2. Diffuse marked intrahepatic biliary ductal dilatation with abrupt caliber transition at the level of the biliary hilum near the central liver/porta hepatis. Vague T2  hyperintensity and restricted diffusion in the central liver at the site of biliary obstruction, spanning approximately 7.5 x 6.8 cm in maximum axial dimensions. 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 GI consultation and consideration of ERCP suggested. 3. Morphologic changes of cirrhosis. Small to moderate volume ascites. 4. Mild diffuse gallbladder wall thickening, nonspecific, likely due to noninflammatory edema. No cholelithiasis. No evidence of choledocholithiasis. Electronically Signed   By:  Ilona Sorrel M.D.   On: 10/04/2019 07:47   IR PERCUTANEOUS TRANSHEPATIC CHOLANGIOGRAM  Result Date: 10/06/2019 INDICATION: 74 year old male with central hilar tumor, obstructing biliary system. He has been referred for biliary drainage EXAM: IMAGE GUIDED PLACEMENT OF RIGHT-SIDED INTERNAL/EXTERNAL BILIARY DRAIN ULTRASOUND-GUIDED PLACEMENT OF LEFT HEPATIC EXTERNALIZED BILIARY DRAIN MEDICATIONS: None ANESTHESIA/SEDATION: Moderate (conscious) sedation was employed during this procedure. A total of Versed 3.5 mg and Fentanyl 150 mcg was administered intravenously. Moderate Sedation Time: 35 minutes. The patient's level of consciousness and vital signs were monitored continuously by radiology nursing throughout the procedure under my direct supervision. FLUOROSCOPY TIME:  Fluoroscopy Time: 8 minutes 12 seconds (88 mGy). COMPLICATIONS: None PROCEDURE: The procedure, risks, benefits, and alternatives were explained to the patient and the patient's family. A complete informed consent was performed, with risk benefit analysis. Specific risks that were discussed for the procedure include bleeding, infection, biliary sepsis, IC use day, organ injury, need for further procedure, need for further surgery, long-term drain placement, cardiopulmonary collapse, death. Questions regarding the procedure were encouraged and answered. The patient understands and consents to the procedure. Patient is position in  supine position on the fluoroscopy table, and the upper abdomen was prepped and draped in the usual sterile fashion. Maximum barrier sterile technique with sterile gowns and gloves were used for the procedure. A timeout was performed prior to the initiation of the procedure. Local anesthesia was provided with 1% lidocaine with epinephrine. Mid axillary line was identified, and a hemostat and ultrasound imaging was used to identified a reasonable rib space for the first approach into the right biliary system. 1% lidocaine was used for local anesthesia, with generous infiltration of the skin and subcutaneous tissues in and intercostal location. A Chiba needle was advanced under ultrasound guidance into the right liver lobe. Two passes of the Chiba needle were required to enter the biliary system. Once the tip of the needle was confirmed within the biliary system by injecting small aliquots of contrast, images were stored of the biliary system after partially opacifying the biliary tree via the needle. Once the tip of this needle was confirmed within the biliary system, an 018 wire was advanced into the obstructed biliary duct. The needle was removed, a small incision was made with an 11 blade scalpel, and then a triaxial Envy system was advanced into the biliary system. Once the system was in the occluded duct, the metal stiffener and the inner introducer were removed over the wire. A check flow valve was advanced over the wire to the back end of the 4 French outer sheath. Contrast was used to opacify the obstructed duct in multiple obliquity. We were then successful within dancing the 018 wire into the extrahepatic biliary system. The inner dilator and the stiffener were advanced over the wire after removing the check flow valve, and the entire and the system was advanced into the extrahepatic biliary duct. Contrast was injected through the 4 [place] system to confirm location. A Glidewire and a 4 [place] glide  catheter used to navigate the Glidewire into the duodenum. Once the catheter was within the duodenum and the wire was removed, a 180 cm Amplatz wire was placed into the duodenum. At this point we turned our attention to the left biliary. Ultrasound was used to identify a safe approach to the left-sided biliary system. 1% lidocaine was used for local anesthesia. Using ultrasound guidance, Chiba needle was used to access a biliary duct. Once we confirmed with ultrasound images the tip within  the duct, contrast was injected confirming location within an enlarged left-sided duct. The 018 wire was then passed into the obstructed duct. A second Envy triaxial system was then advanced on the wire into the obstructed duct. The inter metal stiffener and the introducer were removed. Check flow valve was passed on the back of the 4 [place] system over the wire. Contrast was injected confirming we were within the duct. The 018 wire was then passed to the patient's right, and entered the right-sided occluded ductal system. We then reassembled the triaxial system and past the entire system through the central tumor to the right-sided occluded ducts. The wire and inner stiffener/dilator assembly were removed. Contrast was injected confirming location within the right-sided duct. A new short 45 cm Amplatz wire was passed through the catheter. Ten French dilation was performed. We then placed a 10 French pigtail externalized drain in a straight configuration from the occluded right-sided ducts, across the occlusive tumor in the hepatic hilum, and into the left-sided ducts. Contrast injected confirmed location of the proximal side holes within the left-sided system. Finally, we performed 10 French dilation of the right system with a 10 [place] dilator and then a 20 cm 10 French introducer from a sheath system, with dilation as central as possible. A 10.2 French internal/external biliary drain was then advanced through the occlusive  tumor using the metal stiffener. Once the drain was within the duodenum the inner stiffener and the wire were removed. Contrast injected confirmed location of the 2 separate drains. Both drainage catheters were sutured in position and attached to gravity drainage. A culture was sent to the lab. Final image was stored. The patient tolerated the procedure well and remained hemodynamically stable throughout. No complications were encountered and no significant blood loss was encountered. IMPRESSION: Status post image guided placement of a right percutaneous transhepatic internal/external biliary drain, which terminates in the duodenum, as well as ultrasound-guided placement of an externalized left-sided percutaneous transhepatic biliary drain. Signed, Dulcy Fanny. Dellia Nims, RPVI [facility] Radiology PLAN: Close follow-up will be required as the drains frequently occlude after the initial placement. Short interval follow-up may be attempted for internalization of the left-sided externalized biliary drain. Electronically Signed   By: Corrie Mckusick D.O.   On: 10/06/2019 10:55    IR Paracentesis  Result Date: 10/03/2019 INDICATION: Abdominal distention. Newly found ascites. Request diagnostic and therapeutic paracentesis. EXAM: ULTRASOUND GUIDED RIGHT LOWER QUADRANT PARACENTESIS MEDICATIONS: 1% plain lidocaine, 10 mL COMPLICATIONS: None immediate. PROCEDURE: Informed written consent was obtained from the patient after a discussion of the risks, benefits and alternatives to treatment. A timeout was performed prior to the initiation of the procedure. Initial ultrasound scanning demonstrates a small to moderate amount of ascites within the right lower abdominal quadrant. There is additional perihepatic and perisplenic ascites high in the abdomen. The right lower abdomen was prepped and draped in the usual sterile fashion. 1% lidocaine was used for local anesthesia.  Following this, a 19 gauge, 7-cm, Yueh catheter was introduced. An ultrasound image was saved for documentation purposes. The paracentesis was performed. The catheter was removed and a dressing was applied. The patient tolerated the procedure well without immediate post procedural complication. FINDINGS: A total of approximately 1.3 L of clear yellow fluid was removed. Samples were sent to the laboratory as requested by the clinical team. IMPRESSION: Successful ultrasound-guided paracentesis yielding 1.3 liters of peritoneal fluid. Read by: Ascencion Dike PA-C Electronically Signed   By: Corrie Mckusick  D.O.   On: 10/03/2019 14:16    Labs:  Basic Metabolic Panel: Recent Labs  Lab 10/01/19 2536 10/02/19 2104 10/03/19 2002 10/04/19 1118 10/05/19 0651 10/06/19 0539 10/18/2019 0713  NA 144 141 140 142 142 146* 150*  K 5.0 4.9 3.7 3.2* 3.1* 3.5 4.3  CL 108 106 96* 97* 94* 99 103  CO2 26 25 32 29 34* 36* 31  GLUCOSE 132* 116* 138* 142* 116* 126* 78  BUN 86* 79* 79* 79* 73* 66* 70*  CREATININE 2.98* 2.53* 2.85* 2.71* 2.74* 2.68* 3.13*  CALCIUM 8.5* 8.2* 8.1* 8.0* 7.7* 8.0* 7.6*  PHOS  --   --   --  5.0* 4.8* 4.4  --     CBC: Recent Labs  Lab 10/05/19 0651 10/06/19 0539 10/22/2019 0713  WBC 9.2 12.2* 16.5*  NEUTROABS 7.0 8.8* 13.6*  HGB 13.0 13.8 14.7  HCT 37.8* 40.2 44.1  MCV 101.1* 102.8* 102.6*  PLT 47* 50* 52*    Hepatic Function Latest Ref Rng & Units 10/27/2019 10/06/2019 10/05/2019  Total Protein 6.5 - 8.1 g/dL 6.2(L) - 5.5(L)  Albumin 3.5 - 5.0 g/dL 1.6(L) 1.7(L) 1.6(L)  AST 15 - 41 U/L 175(H) - 118(H)  ALT 0 - 44 U/L 112(H) - 96(H)  Alk Phosphatase 38 - 126 U/L 116 - 105  Total Bilirubin 0.3 - 1.2 mg/dL 8.0(H) - 5.7(H)   CBG: No results for input(s): GLUCAP in the last 168 hours.  Brief HPI:   Azure Barrales is a 74 y.o. male with history of HTN, Hep C, multiple falls for past few months with extensive work up and recently evaluated by Dr. Tomi Likens 09/18/19 for potential  Parkinson's disease with recommendations to start a trial of Sinemet.  Patient was admitted on 09/18/2019 after a fall at home where he struck a dresser and was unable to see after a fall.  CT head/eyes revealed hyphema, ruptured globe left eye with open globe, uveal prolapse with loss of tissue and no light perception.  Dr. Eulas Post was consulted for assistance and patient underwent surgical repair of left eye 09/16.  He was placed on broad-spectrum antibiotics as well as recommendations to continue shield at all times as well as eyedrops for 1 month. Hospital course significant for AKI felt to be multifactorial and Foley placed for B04.  Nephrology following for input and renal function was slowly improving.    Abdominal ultrasound was ordered showing distended gallbladder with intrahepatic biliary duct dilatation as well as abnormal LFTs.  HIDA scan done due to ongoing issues with abdominal pain and was negative for acute pathology but showed potential hepatic developmental anomaly as well as biliary dilatation with recommendations for MR imaging/MRCP.  As HIDA scan was negative for acute pathology; medicine recommended monitoring LFTs and no intervention needed at this time.  As blood pressures were trending upwards Coreg resumed and he was transitioned to oral antibiotics for additional 3 days to complete his antibiotic regimen.  He was evaluated by rehab team and it was felt that he would benefit from intensive rehab program to address his multifactorial balance and functional deficits.   Hospital Course: Aloysuis Ribaudo was admitted to rehab 09/30/2019 for inpatient therapies to consist of PT, ST and OT at least three hours five days a week. Past admission physiatrist, therapy team and rehab RN have worked together to provide customized collaborative inpatient rehab. He required max assist for mobility and total assist with basic self care tasks. He had delay in processing with weakness and  rigidity LUE as  well LLE clonus. He was also noted to have generalized swelling in extremities and left peri-orbital edema with tenderness. He has completed 3 day course of Levaquin and doxycycline. His eye pain continues to be an issue and is managed with prn [place] of oxycodone. Dr. Glenn/ophthalmology has followed for input and felt that patient without light perception vision on presentation exam and felt that enucleation or evisceration was looking more likely with extermely guarded visual prognosis for left eye. He recommended continuing eye drops qid --to space these at least 5 minutes followed by ointment last, then re-secure /retape eye shield.   Blood pressures were monitored on TID basis and he continues on coreg to help manage BP and HR. He continued to report abdominal pain and distension therefore KUB ordered revealing colonic distension of 14 cm concerning for cecal bascule or obstruction. Patient had been able to tolerate intake and having BMs.  Patient was made n.p.o. with IV fluids for hydration.  CT abdomen ordered for work up and [place] as well as [facility] consulted for input.  CT showed: Small bowel to be decompressed without bowel obstruction.  He was noted to have moderate ascites intrahepatic biliary ductal dilatation.  CCS felt that patient was not a surgical candidate and to monitor with serial abdominal exams.  Bilateral lower extremity edema managed with use of Ace wrap for compression however he has developed large fluid-filled blister on lower extremities which ruptured and are currently being treated by Xeroform gauze and nonadherent dressing.  Dr. Ardis Hughs evaluated patient and expressed concerns about underlying hepatobiliary malignancy.  MRI liver ordered for work-up and  underwent paracentesis of 1.3 L clear yellow fluid ascites which was sent for cytology.  Fluid was negative for SBP and SAAG not calculated due to amount of fluid.  Patient was felt to have large central [place] type tumor probably  cholangiocarcinoma.  Family wanted full scope of practice and patient was set up for biliary decompression with attempt at ERCP and stricture sampling however procedure was canceled due to concerns of patient lying on his stomach and risk of further injury to his left eye.    Interventional radiology consulted for assistant and patient underwent right-sided internal/external biliary drain placement to duodenum and left-sided external biliary drain placement by Dr. Earleen Newport on 10/03.  Biopsy not done as pathology is unavailable and plans to exchange of drains in the next 24 to 48 hours with biopsy at that time.  Patient has had issues with tachycardia postprocedure felt to be likely due to holding of his beta-blocker.  He is also had less lethargy post procedure with minimal intake. Labs done today showing leukocytosis with hypernatremia, thrombocytopenia and worsening of renal status.  Dr. Johnney Ou recommended IV albumin for support due to evidence of dehydration/poor intake in addition to low rate D5W to help manage hyponatremia secondary to post ATN osmotic diuresis. Patient's rehab has been limited due to multiple medical issues and procedures. Due to lethargy as well as ongoing medical issues he was felt to require higher level of care. Dr. Lorin Mercy was consulted for assistance and patient was discharged to acute floor for closer monitoring/further work up.   Current Medications:   carvedilol  3.125 mg Oral BID WC   Chlorhexidine Gluconate Cloth  6 each Topical BID   DULoxetine  60 mg Oral Daily   finasteride  5 mg Oral Daily   gatifloxacin  1 drop Left Eye QID   loratadine  10 mg Oral  Daily   mirabegron ER  50 mg Oral Daily   neomycin-polymyxin b-dexamethasone  1 application Left Eye QID   nystatin  5 mL Oral QID   prednisoLONE acetate  1 drop Left Eye QID   senna-docusate  1 tablet Oral BID    Diet: Dysphagia 2, thin liquids.   Wound care:  1. To blister on BLE: Xeroform guaze with non adherent  dressing to intact and ruptured blisters on BLE. Cover with ABD pad, Kerlix and ace wrap. 2. To left eye:  Continue eye drops qid --space these at least 5 minutes apart followed by ointment last. Then rescure/retape eye shield.   Discharge disposition: [facility] Not Defined   Signed: Bary Leriche 10/09/2019, 10:47 AM Patient was seen, face-face, and physical exam performed by me on day of discharge, greater than 30 minutes of total time spent.. Please see progress note from day of discharge as well.  Delice Lesch, MD, ABPMR

## 2019-10-07 NOTE — Progress Notes (Signed)
Patient able to respond to voice but very lethargic, unable to take am meds. PA notified, see order. We continue to monitor.

## 2019-10-08 ENCOUNTER — Encounter (HOSPITAL_COMMUNITY): Payer: Self-pay

## 2019-10-08 ENCOUNTER — Other Ambulatory Visit (HOSPITAL_COMMUNITY): Payer: Self-pay | Admitting: Gastroenterology

## 2019-10-08 DIAGNOSIS — K7460 Unspecified cirrhosis of liver: Secondary | ICD-10-CM | POA: Diagnosis not present

## 2019-10-08 DIAGNOSIS — N179 Acute kidney failure, unspecified: Secondary | ICD-10-CM | POA: Diagnosis not present

## 2019-10-08 DIAGNOSIS — R4020 Unspecified coma: Secondary | ICD-10-CM | POA: Diagnosis not present

## 2019-10-08 DIAGNOSIS — Z66 Do not resuscitate: Secondary | ICD-10-CM | POA: Diagnosis not present

## 2019-10-08 DIAGNOSIS — C801 Malignant (primary) neoplasm, unspecified: Secondary | ICD-10-CM

## 2019-10-08 LAB — CULTURE, BODY FLUID W GRAM STAIN -BOTTLE: Culture: NO GROWTH

## 2019-10-08 LAB — CBC
HCT: 47.4 % (ref 39.0–52.0)
Hemoglobin: 15.8 g/dL (ref 13.0–17.0)
MCH: 34.3 pg — ABNORMAL HIGH (ref 26.0–34.0)
MCHC: 33.3 g/dL (ref 30.0–36.0)
MCV: 102.8 fL — ABNORMAL HIGH (ref 80.0–100.0)
Platelets: 54 10*3/uL — ABNORMAL LOW (ref 150–400)
RBC: 4.61 MIL/uL (ref 4.22–5.81)
RDW: 18.7 % — ABNORMAL HIGH (ref 11.5–15.5)
WBC: 17.8 10*3/uL — ABNORMAL HIGH (ref 4.0–10.5)
nRBC: 0.3 % — ABNORMAL HIGH (ref 0.0–0.2)

## 2019-10-08 LAB — COMPREHENSIVE METABOLIC PANEL
ALT: 124 U/L — ABNORMAL HIGH (ref 0–44)
AST: 204 U/L — ABNORMAL HIGH (ref 15–41)
Albumin: 1.7 g/dL — ABNORMAL LOW (ref 3.5–5.0)
Alkaline Phosphatase: 124 U/L (ref 38–126)
Anion gap: 19 — ABNORMAL HIGH (ref 5–15)
BUN: 75 mg/dL — ABNORMAL HIGH (ref 8–23)
CO2: 28 mmol/L (ref 22–32)
Calcium: 6.8 mg/dL — ABNORMAL LOW (ref 8.9–10.3)
Chloride: 106 mmol/L (ref 98–111)
Creatinine, Ser: 3.26 mg/dL — ABNORMAL HIGH (ref 0.61–1.24)
GFR calc Af Amer: 21 mL/min — ABNORMAL LOW (ref >60)
GFR calc non Af Amer: 18 mL/min — ABNORMAL LOW (ref >60)
Glucose, Bld: 81 mg/dL (ref 70–99)
Potassium: 4 mmol/L (ref 3.5–5.1)
Sodium: 153 mmol/L — ABNORMAL HIGH (ref 135–145)
Total Bilirubin: 9.9 mg/dL — ABNORMAL HIGH (ref 0.3–1.2)
Total Protein: 6.4 g/dL — ABNORMAL LOW (ref 6.5–8.1)

## 2019-10-08 LAB — AMMONIA: Ammonia: 90 umol/L — ABNORMAL HIGH (ref 9–35)

## 2019-10-08 LAB — PROTIME-INR
INR: 1.4 — ABNORMAL HIGH (ref 0.8–1.2)
Prothrombin Time: 17 seconds — ABNORMAL HIGH (ref 11.4–15.2)

## 2019-10-08 MED ORDER — LACTULOSE 10 GM/15ML PO SOLN: 20.0000 g | Freq: Every day | ORAL | Status: DC

## 2019-10-09 LAB — BLOOD CULTURE ID PANEL (REFLEXED) - BCID2

## 2019-10-11 LAB — CULTURE, BLOOD (ROUTINE X 2)

## 2019-10-12 LAB — CULTURE, BLOOD (ROUTINE X 2): Culture: NO GROWTH

## 2019-10-12 LAB — CULTURE, BODY FLUID W GRAM STAIN -BOTTLE

## 2019-11-04 NOTE — Progress Notes (Signed)
Came in to round on the patient, wife in room with chaplain as patient had unfortunately just recently passed away, apparently v tach arrest within the past hour. I spoke with the wife and answered all of her questions about his care up to this point and offered our condolences. Unfortunately his prognosis was poor with worsening renal failure in the setting of malignancy / cirrhosis. She is appreciative of all of the care he has received in the hospital up to this point. I am available should she have any further questions that I can assist with, please contact me.  Seminole Cellar, MD [facility]

## 2019-11-04 NOTE — Progress Notes (Signed)
   10-30-19 0817  Clinical Encounter Type  Visited With Family;Patient  Visit Type Death;Spiritual support;Social support;Psychological support;Initial  Referral From Nurse  Consult/Referral To Chaplain  Spiritual Encounters  Spiritual Needs Prayer;Emotional;Grief support  Stress Factors  Family Stress Factors Loss;Family relationships   Chaplain responded to page for Pt's wife, Vicente Males, requesting prayer. Chaplain provided emotional, spiritual, and grief support for Vicente Males. Vicente Males mentioned wanting to use [facility] in [place], [place]. The phone number is [phone]. Chaplain gave Pt's wife a patient placement card in case she has any questions/changes. Chaplain prayed with Pt's wife. Chaplain was with Pt's wife until she wheeled her out of the hospital. Pt's wife expressed gratitude for the whole team who took care of her husband and all that they did.   This note was prepared by Chaplain Resident, Dante Gang, MDiv. For questions, please contact by phone at [phone].

## 2019-11-04 NOTE — Progress Notes (Signed)
Upon entering room O2 Sat 68%. Increased O2 to 4L. Placed new O2 Sat probe. O2 sat increased to 90%. Respiratory notified to check patient. Wife walked in room and greeted patient. Patient remains nonverbal, unresponsive. Charge nurse entered room and stated Tele called pt in [place], Upon checking patient, no respirations or pulse noted per asculation  for 1 minute. Wife at bedside. Chaplain and MD notified  And in to see wife.

## 2019-11-04 NOTE — Discharge Summary (Addendum)
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 Acute hepatic encephalopathy in the setting of hepatorenal syndrome and hyperammonemia 2. VTach arrest 3. Sepsis ruled out (no identified source)  Hospital Course:  Nicholas Ramirez a 74 y.o.malewith medical history significant ofHTN; Hep C with cirrhosis; possibleParkinson's, recently prescribed medications but has not started; and obesity (BMI 42.53) who was previously admitted for multiple falls. He was admitted from 9/15-27 for his most recent fall, with left globe rupture. His wife reports that about 6 years ago he was diagnosed with Hep C. He was treated for 12 weeks but he was still positive and so he was treated again and then converted to negative and back to positive later. It caused some cirrhosis but he had normal liver functions. He was having routine ultrasounds to monitor his progress. In 01/2018 he had carpal tunnel surgery and healed well but was never able to use His hand. After that, he started falling - would fall out of bed and when walking, uncertain reason why. He also developed some LE edema and he was started on a fluid pill. He continued to fall frequently. They saw a neurologist on 9/13; MRI was unremarkable and he was concerned for Parkinson's but possibly multifactorial. He was started on Parkinson's meds as a trial - but he did not start it. He fell on 9/15 with globe rupture; during his hospitalization he had  surgical repair and was recommend to have long-term antibiotics.He developed AKI during hospitalization; nephrology was consulted, Vanc was stopped, a foley was placed for retention, and HCTZ/lisinopril were held - these interventions resulted in slow improvement in his renal function without return to the baseline. He also had elevated LFTs and bilirubin during the hospitalization. RUQ [place] and HIDA scan were unremarkable. He was doing well at the time of rehab transfer. Since transfer to rehab, his LFTs were up a bit. They thought he was doing well but his gallbladder appeared to be abnormal. His wife thought he was doing well with rehab. On Saturday, he was alert and talking and very thirsty; he participated in therapy that day. He was supposed to have biopsy and drain placement but they did that Sunday instead. He did therapy that day. When wife came in today, he was obtunded and groaning.  Unfortunately early morning on 2019/10/14 patient was noted to have V. tach alarm on telemetry, given his altered mental status and hepatic encephalopathy patient was extremely high risk for aspiration, suspect patient had aspiration event with subsequent hypoxia and ultimately V. tach arrest.  By the time nursing staff was at bedside patient was in asystole, he is DNR and as such no chest compressions were performed.  Wife was at bedside during the event.  Patient was pronounced at 0800 on 10-14-19 by staff per protocol.  Patient was evaluated at bedside, no heart sounds were auscultated, no chest rise, pupils dilated and fixed nonresponsive to tactile or verbal stimuli.  Time: 0800  Signed:  Little Ishikawa  DO  [facility] 2019/10/16, 3:19 PM

## 2019-11-04 DEATH — deceased

## 2020-03-17 ENCOUNTER — Ambulatory Visit: Payer: Medicare Other | Admitting: Neurology
